# Patient Record
Sex: Male | Born: 1945
Health system: Southern US, Community
[De-identification: ages and names within clinical notes are randomized; demographics above are authoritative.]

## PROBLEM LIST (undated history)

## (undated) ENCOUNTER — Ambulatory Visit: Payer: Medicare PPO

## (undated) DIAGNOSIS — F4310 Post-traumatic stress disorder, unspecified: Secondary | ICD-10-CM

## (undated) DIAGNOSIS — E785 Hyperlipidemia, unspecified: Secondary | ICD-10-CM

## (undated) DIAGNOSIS — I10 Essential (primary) hypertension: Secondary | ICD-10-CM

## (undated) DIAGNOSIS — I219 Acute myocardial infarction, unspecified: Secondary | ICD-10-CM

## (undated) DIAGNOSIS — H409 Unspecified glaucoma: Secondary | ICD-10-CM

## (undated) DIAGNOSIS — I2510 Atherosclerotic heart disease of native coronary artery without angina pectoris: Secondary | ICD-10-CM

## (undated) HISTORY — PX: CERVICAL SPINE SURGERY: SHX589

## (undated) HISTORY — PX: NASAL SINUS SURGERY: SHX719

## (undated) HISTORY — PX: CORONARY ANGIOPLASTY WITH STENT PLACEMENT: SHX49

## (undated) HISTORY — PX: TONSILLECTOMY: SUR1361

---

## 2006-04-29 HISTORY — PX: COLONOSCOPY: SHX174

## 2009-04-29 HISTORY — PX: SALIVARY GLAND SURGERY: SHX768

## 2011-07-10 ENCOUNTER — Ambulatory Visit: Payer: Self-pay | Admitting: Cardiology

## 2013-06-07 ENCOUNTER — Ambulatory Visit: Payer: Self-pay | Admitting: Cardiology

## 2014-03-11 DIAGNOSIS — Z9889 Other specified postprocedural states: Secondary | ICD-10-CM | POA: Insufficient documentation

## 2014-03-14 DIAGNOSIS — K219 Gastro-esophageal reflux disease without esophagitis: Secondary | ICD-10-CM | POA: Insufficient documentation

## 2015-02-13 ENCOUNTER — Encounter: Payer: Self-pay | Admitting: Internal Medicine

## 2015-02-13 DIAGNOSIS — F4310 Post-traumatic stress disorder, unspecified: Secondary | ICD-10-CM | POA: Insufficient documentation

## 2015-02-13 DIAGNOSIS — M5136 Other intervertebral disc degeneration, lumbar region: Secondary | ICD-10-CM | POA: Insufficient documentation

## 2015-02-13 DIAGNOSIS — I10 Essential (primary) hypertension: Secondary | ICD-10-CM | POA: Insufficient documentation

## 2015-02-13 DIAGNOSIS — E7849 Other hyperlipidemia: Secondary | ICD-10-CM | POA: Insufficient documentation

## 2015-02-13 DIAGNOSIS — I2510 Atherosclerotic heart disease of native coronary artery without angina pectoris: Secondary | ICD-10-CM | POA: Insufficient documentation

## 2015-02-13 DIAGNOSIS — H9190 Unspecified hearing loss, unspecified ear: Secondary | ICD-10-CM | POA: Insufficient documentation

## 2015-02-13 DIAGNOSIS — L608 Other nail disorders: Secondary | ICD-10-CM | POA: Insufficient documentation

## 2015-02-14 ENCOUNTER — Other Ambulatory Visit: Payer: Self-pay | Admitting: Internal Medicine

## 2015-02-14 ENCOUNTER — Encounter: Payer: Self-pay | Admitting: Internal Medicine

## 2015-02-14 ENCOUNTER — Ambulatory Visit (INDEPENDENT_AMBULATORY_CARE_PROVIDER_SITE_OTHER): Payer: Medicare PPO | Admitting: Internal Medicine

## 2015-02-14 VITALS — BP 122/68 | HR 64 | Temp 98.4°F | Ht 70.0 in | Wt 169.4 lb

## 2015-02-14 DIAGNOSIS — J40 Bronchitis, not specified as acute or chronic: Secondary | ICD-10-CM

## 2015-02-14 MED ORDER — FLUTICASONE PROPIONATE 50 MCG/ACT NA SUSP: 2.0000 | Freq: Every day | NASAL | Status: DC

## 2015-02-14 MED ORDER — AMOXICILLIN-POT CLAVULANATE 875-125 MG PO TABS: 1.0000 | ORAL_TABLET | Freq: Two times a day (BID) | ORAL | Status: DC

## 2015-02-14 NOTE — Progress Notes (Signed)
Date:  02/14/2015   Name:  Dominic LorenzoKenneth E Mcluckie   DOB:  12/20/1945   MRN:  [id number]   Chief Complaint: Sinusitis Patient started with symptoms about 1 week ago. Initially a little chest congestion and cough and moved into sinus congestion. He is using neti pot and producing some thick yellow mucus.he denies fever or wheezing. Yesterday began to have pain in his right ear without drainage or bleeding. He's been unable to wear the hearing aid on that side.   Review of Systems  Constitutional: Negative for fever, chills and fatigue.  HENT: Positive for congestion, ear pain, postnasal drip, rhinorrhea, sinus pressure and sore throat.   Respiratory: Positive for cough. Negative for chest tightness, shortness of breath and wheezing.   Cardiovascular: Negative for chest pain, palpitations and leg swelling.  Gastrointestinal: Negative for vomiting and diarrhea.    Patient Active Problem List   Diagnosis Date Noted  . Familial multiple lipoprotein-type hyperlipidemia 02/13/2015  . Hearing loss 02/13/2015  . Neurosis, posttraumatic 02/13/2015  . Arteriosclerosis of coronary artery 02/13/2015  . Degeneration of intervertebral disc of lumbar region 02/13/2015  . Essential (primary) hypertension 02/13/2015  . Discoloration of nail 02/13/2015  . Acid reflux 03/14/2014  . History of cardiac catheterization 03/11/2014    Prior to Admission medications   Medication Sig Start Date End Date Taking? Authorizing Provider  aspirin (ECOTRIN LOW STRENGTH) 81 MG EC tablet Take 1 tablet by mouth daily.   Yes Historical Provider, MD  Cholecalciferol (HM VITAMIN D3) 4000 UNITS CAPS Take by mouth.   Yes Historical Provider, MD  clopidogrel (PLAVIX) 75 MG tablet Take 1 tablet by mouth daily.   Yes Historical Provider, MD  co-enzyme Q-10 30 MG capsule Take 30 mg by mouth 3 (three) times daily.   Yes Historical Provider, MD  Hydrocodone-Acetaminophen (VICODIN) 5-300 MG TABS Take by mouth.   Yes Historical  Provider, MD  ketoconazole (NIZORAL) 2 % shampoo KETOCONAZOLE, 2% (External Shampoo) - Historical Medication  (2 %) Active   Yes Historical Provider, MD  LORazepam (ATIVAN) 1 MG tablet Take by mouth.   Yes Historical Provider, MD  Multiple Vitamins-Minerals (CENTRUM SILVER) tablet Take by mouth.   Yes Historical Provider, MD  nitroGLYCERIN (NITROSTAT) 0.4 MG SL tablet Place under the tongue.   Yes Historical Provider, MD  olmesartan (BENICAR) 20 MG tablet Take 1 tablet by mouth daily.   Yes Historical Provider, MD  rosuvastatin (CRESTOR) 40 MG tablet Take 1 tablet by mouth daily.   Yes Historical Provider, MD  vitamin C (ASCORBIC ACID) 500 MG tablet Take by mouth.   Yes Historical Provider, MD    Allergies  Allergen Reactions  . Nortriptyline Hcl   . Prazosin Hcl     joint pain  . Quetiapine Fumarate   . Tizanidine     Past Surgical History  Procedure Laterality Date  . Tonsillectomy    . Nasal sinus surgery    . Cervical spine surgery    . Salivary gland surgery Right 2011    oncocytoma  . Coronary angioplasty with stent placement    . Colonoscopy  2008    Social History  Substance Use Topics  . Smoking status: Never Smoker   . Smokeless tobacco: None  . Alcohol Use: No    Medication list has been reviewed and updated.  Physical Exam  Constitutional: He is oriented to person, place, and time. He appears well-developed and well-nourished.  HENT:  Right Ear: External ear and ear canal normal. Tympanic  membrane is not erythematous and not retracted. A middle ear effusion is present.  Left Ear: External ear and ear canal normal. Tympanic membrane is not erythematous and not retracted.  No middle ear effusion.  Nose: Right sinus exhibits no maxillary sinus tenderness and no frontal sinus tenderness. Left sinus exhibits no maxillary sinus tenderness and no frontal sinus tenderness.  Mouth/Throat: Uvula is midline and mucous membranes are normal. No oral lesions. Posterior  oropharyngeal erythema present. No oropharyngeal exudate.  Cardiovascular: Normal rate, regular rhythm, S1 normal and normal heart sounds.   Pulmonary/Chest: Effort normal and breath sounds normal. He has no wheezes. He has no rales.  Lymphadenopathy:    He has no cervical adenopathy.  Neurological: He is alert and oriented to person, place, and time.  Psychiatric: He has a normal mood and affect.  Nursing note and vitals reviewed.   BP 122/68 mmHg  Pulse 64  Temp(Src) 98.4 F (36.9 C)  Ht  (1.778 m)  Wt 169 lb 6.4 oz (76.839 kg)  BMI 24.31 kg/m2  SpO2 96%  Assessment and Plan: 1. Bronchitis with sinusitis and OM Continue nasal rinses and over-the-counter cough suppressant - amoxicillin-clavulanate (AUGMENTIN) 875-125 MG tablet; Take 1 tablet by mouth 2 (two) times daily.  Dispense: 20 tablet; Refill: 0 - fluticasone (FLONASE) 50 MCG/ACT nasal spray; Place 2 sprays into both nostrils daily.  Dispense: 16 g; Refill: 6   Bari Edward, MD [facility]  02/14/2015

## 2015-03-08 DIAGNOSIS — E7800 Pure hypercholesterolemia, unspecified: Secondary | ICD-10-CM | POA: Diagnosis not present

## 2015-03-08 DIAGNOSIS — Z9889 Other specified postprocedural states: Secondary | ICD-10-CM | POA: Diagnosis not present

## 2015-03-08 DIAGNOSIS — I10 Essential (primary) hypertension: Secondary | ICD-10-CM | POA: Diagnosis not present

## 2015-03-15 ENCOUNTER — Ambulatory Visit: Payer: Self-pay | Admitting: Internal Medicine

## 2015-03-15 ENCOUNTER — Ambulatory Visit (INDEPENDENT_AMBULATORY_CARE_PROVIDER_SITE_OTHER): Payer: Medicare PPO | Admitting: Internal Medicine

## 2015-03-15 ENCOUNTER — Encounter: Payer: Self-pay | Admitting: Internal Medicine

## 2015-03-15 VITALS — BP 122/68 | HR 72 | Temp 98.3°F | Ht 70.0 in | Wt 168.8 lb

## 2015-03-15 DIAGNOSIS — H6691 Otitis media, unspecified, right ear: Secondary | ICD-10-CM

## 2015-03-15 MED ORDER — AMOXICILLIN-POT CLAVULANATE 875-125 MG PO TABS: 1.0000 | ORAL_TABLET | Freq: Two times a day (BID) | ORAL | Status: DC

## 2015-03-15 NOTE — Progress Notes (Signed)
Date:  03/15/2015   Name:  Dominic Mcmillan   DOB:  10-03-1945   MRN:  [id number]   Chief Complaint: Otalgia  Patient was seen about a month ago with sinus and otitis. He had a right middle ear effusion. He was prescribed 10 days of Augmentin and Flonase nasal spray which he has completed. He felt a bit better but now his right ear symptoms have recurred. In general he thinks his hearing is slightly decreased and he does have hearing aids for both ears. He denies fever, chills, dizziness, or ear drainage.   Review of Systems  Constitutional: Negative for fever, chills and fatigue.  HENT: Positive for ear pain and hearing loss. Negative for ear discharge, postnasal drip, sore throat and tinnitus.   Respiratory: Negative for cough and shortness of breath.   Cardiovascular: Negative for chest pain.  Neurological: Negative for dizziness.    Patient Active Problem List   Diagnosis Date Noted  . Familial multiple lipoprotein-type hyperlipidemia 02/13/2015  . Hearing loss 02/13/2015  . Neurosis, posttraumatic 02/13/2015  . Arteriosclerosis of coronary artery 02/13/2015  . Degeneration of intervertebral disc of lumbar region 02/13/2015  . Essential (primary) hypertension 02/13/2015  . Discoloration of nail 02/13/2015  . Acid reflux 03/14/2014  . History of cardiac catheterization 03/11/2014    Prior to Admission medications   Medication Sig Start Date End Date Taking? Authorizing Provider  aspirin (ECOTRIN LOW STRENGTH) 81 MG EC tablet Take 1 tablet by mouth daily.   Yes Historical Provider, MD  Cholecalciferol (HM VITAMIN D3) 4000 UNITS CAPS Take by mouth.   Yes Historical Provider, MD  clopidogrel (PLAVIX) 75 MG tablet Take 1 tablet by mouth daily.   Yes Historical Provider, MD  co-enzyme Q-10 30 MG capsule Take 30 mg by mouth 3 (three) times daily.   Yes Historical Provider, MD  fluticasone (FLONASE) 50 MCG/ACT nasal spray Place 2 sprays into both nostrils daily. 02/14/15  Yes Reubin Milan, MD  Hydrocodone-Acetaminophen (VICODIN) 5-300 MG TABS Take by mouth.   Yes Historical Provider, MD  ketoconazole (NIZORAL) 2 % shampoo KETOCONAZOLE, 2% (External Shampoo) - Historical Medication  (2 %) Active   Yes Historical Provider, MD  LORazepam (ATIVAN) 1 MG tablet Take by mouth.   Yes Historical Provider, MD  Multiple Vitamins-Minerals (CENTRUM SILVER) tablet Take by mouth.   Yes Historical Provider, MD  nitroGLYCERIN (NITROSTAT) 0.4 MG SL tablet Place under the tongue.   Yes Historical Provider, MD  olmesartan (BENICAR) 20 MG tablet Take 1 tablet by mouth daily.   Yes Historical Provider, MD  rosuvastatin (CRESTOR) 40 MG tablet Take 1 tablet by mouth daily.   Yes Historical Provider, MD  vitamin C (ASCORBIC ACID) 500 MG tablet Take by mouth.   Yes Historical Provider, MD    Allergies  Allergen Reactions  . Nortriptyline Hcl   . Prazosin Hcl     joint pain  . Quetiapine Fumarate   . Tizanidine     Past Surgical History  Procedure Laterality Date  . Tonsillectomy    . Nasal sinus surgery    . Cervical spine surgery    . Salivary gland surgery Right 2011    oncocytoma  . Coronary angioplasty with stent placement    . Colonoscopy  2008    Social History  Substance Use Topics  . Smoking status: Never Smoker   . Smokeless tobacco: None  . Alcohol Use: No    Medication list has been reviewed and updated.  Physical Exam  HENT:  Right Ear: Tympanic membrane is retracted. Tympanic membrane is not scarred and not erythematous. A middle ear effusion is present.  Nose: Right sinus exhibits no maxillary sinus tenderness and no frontal sinus tenderness. Left sinus exhibits no maxillary sinus tenderness and no frontal sinus tenderness.  Mouth/Throat: Oropharynx is clear and moist.  Cardiovascular: Normal rate, regular rhythm and normal heart sounds.   Pulmonary/Chest: Effort normal and breath sounds normal.  Lymphadenopathy:       Head (right side): Posterior  auricular adenopathy present.  Nursing note and vitals reviewed.   BP 122/68 mmHg  Pulse 72  Temp(Src) 98.3 F (36.8 C)  Ht 5\' 10"  (1.778 m)  Wt 168 lb 12.8 oz (76.567 kg)  BMI 24.22 kg/m2  SpO2 95%  Assessment and Plan: 1. Otitis media follow-up, not resolved, right Another course of antibiotics if no resolution will refer to ENT - amoxicillin-clavulanate (AUGMENTIN) 875-125 MG tablet; Take 1 tablet by mouth 2 (two) times daily.  Dispense: 20 tablet; Refill: 0   Bari EdwardLaura Berglund, MD [facility]  03/15/2015

## 2015-03-28 ENCOUNTER — Other Ambulatory Visit: Payer: Self-pay | Admitting: Internal Medicine

## 2015-03-28 DIAGNOSIS — H6690 Otitis media, unspecified, unspecified ear: Secondary | ICD-10-CM | POA: Insufficient documentation

## 2015-03-28 DIAGNOSIS — H6691 Otitis media, unspecified, right ear: Secondary | ICD-10-CM

## 2015-03-29 ENCOUNTER — Ambulatory Visit (INDEPENDENT_AMBULATORY_CARE_PROVIDER_SITE_OTHER): Payer: Medicare PPO | Admitting: Internal Medicine

## 2015-03-29 ENCOUNTER — Encounter: Payer: Self-pay | Admitting: Internal Medicine

## 2015-03-29 VITALS — BP 100/60 | HR 68 | Temp 98.2°F | Ht 70.0 in | Wt 167.9 lb

## 2015-03-29 DIAGNOSIS — H6691 Otitis media, unspecified, right ear: Secondary | ICD-10-CM

## 2015-03-29 MED ORDER — METHYLPREDNISOLONE 4 MG PO TBPK: ORAL_TABLET | ORAL | Status: DC

## 2015-03-29 NOTE — Progress Notes (Signed)
Date:  03/29/2015   Name:  Dominic LorenzoKenneth E Mcmillan   DOB:  02/11/1946   MRN:  [id number]   Chief Complaint: Otalgia  patient returns for ongoing right ear pain. He's been seen twice in the past 6 weeks with right ear pain otitis media and middle ear effusion. He was initially treated with Flonase and Augmentin and seemed to improve. He returned with persistent problem so then was given an additional course of Augmentin. He is here today because he still has intermittent discomfort in his right ear sometimes relatively severe. He uses heat which does help. At this point he would like to have an ENT evaluation.  Review of Systems  Constitutional: Negative for chills and fatigue.  HENT: Positive for ear pain and hearing loss. Negative for ear discharge, sinus pressure and sore throat.   Eyes: Negative for visual disturbance.  Respiratory: Negative for cough and shortness of breath.   Cardiovascular: Negative for chest pain.  Neurological: Negative for dizziness, syncope, light-headedness and headaches.    Patient Active Problem List   Diagnosis Date Noted  . Otitis media 03/28/2015  . Familial multiple lipoprotein-type hyperlipidemia 02/13/2015  . Hearing loss 02/13/2015  . Neurosis, posttraumatic 02/13/2015  . Arteriosclerosis of coronary artery 02/13/2015  . Degeneration of intervertebral disc of lumbar region 02/13/2015  . Essential (primary) hypertension 02/13/2015  . Discoloration of nail 02/13/2015  . Acid reflux 03/14/2014  . History of cardiac catheterization 03/11/2014    Prior to Admission medications   Medication Sig Start Date End Date Taking? Authorizing Provider  aspirin (ECOTRIN LOW STRENGTH) 81 MG EC tablet Take 1 tablet by mouth daily.   Yes Historical Provider, MD  Cholecalciferol (HM VITAMIN D3) 4000 UNITS CAPS Take by mouth.   Yes Historical Provider, MD  clopidogrel (PLAVIX) 75 MG tablet Take 1 tablet by mouth daily.   Yes Historical Provider, MD  co-enzyme Q-10 30  MG capsule Take 30 mg by mouth 3 (three) times daily.   Yes Historical Provider, MD  fluticasone (FLONASE) 50 MCG/ACT nasal spray Place 2 sprays into both nostrils daily. 02/14/15  Yes Reubin MilanLaura H Jun Rightmyer, MD  Hydrocodone-Acetaminophen (VICODIN) 5-300 MG TABS Take by mouth.   Yes Historical Provider, MD  ketoconazole (NIZORAL) 2 % shampoo KETOCONAZOLE, 2% (External Shampoo) - Historical Medication  (2 %) Active   Yes Historical Provider, MD  LORazepam (ATIVAN) 1 MG tablet Take by mouth.   Yes Historical Provider, MD  Multiple Vitamins-Minerals (CENTRUM SILVER) tablet Take by mouth.   Yes Historical Provider, MD  nitroGLYCERIN (NITROSTAT) 0.4 MG SL tablet Place under the tongue.   Yes Historical Provider, MD  olmesartan (BENICAR) 20 MG tablet Take 1 tablet by mouth daily.   Yes Historical Provider, MD  rosuvastatin (CRESTOR) 40 MG tablet Take 1 tablet by mouth daily.   Yes Historical Provider, MD  vitamin C (ASCORBIC ACID) 500 MG tablet Take by mouth.   Yes Historical Provider, MD    Allergies  Allergen Reactions  . Nortriptyline Hcl   . Prazosin Hcl     joint pain  . Quetiapine Fumarate   . Tizanidine     Past Surgical History  Procedure Laterality Date  . Tonsillectomy    . Nasal sinus surgery    . Cervical spine surgery    . Salivary gland surgery Right 2011    oncocytoma  . Coronary angioplasty with stent placement    . Colonoscopy  2008    Social History  Substance Use Topics  . Smoking  status: Never Smoker   . Smokeless tobacco: None  . Alcohol Use: No    Medication list has been reviewed and updated.   Physical Exam  Constitutional: He appears well-developed and well-nourished. No distress.  HENT:  Right Ear: Tympanic membrane and ear canal normal.  Left Ear: Ear canal normal. A middle ear effusion (smaller than last visit) is present.  Mouth/Throat: Oropharynx is clear and moist.  Neck: Normal range of motion. Neck supple. No thyromegaly present.   Cardiovascular: Normal rate, regular rhythm and normal heart sounds.   Pulmonary/Chest: Effort normal and breath sounds normal.  Lymphadenopathy:    He has no cervical adenopathy.    BP 100/60 mmHg  Pulse 68  Temp(Src) 98.2 F (36.8 C)  Ht  (1.778 m)  Wt 167 lb 14.4 oz (76.159 kg)  BMI 24.09 kg/m2  SpO2 96%  Assessment and Plan: 1. Recurrent otitis media of right ear, unspecified chronicity, unspecified otitis media type Resume Flonase NS - methylPREDNISolone (MEDROL DOSEPAK) 4 MG TBPK tablet; Take 6 pills on day 1 the 5 pills day 2 then 4 pills day 3 then 3 pills day 4 then 2 pills day 5 then one pills day 6 then stop  Dispense: 21 tablet; Refill: 0 See ENT as planned next week.  Bari Edward, MD [facility]  03/29/2015

## 2015-04-05 DIAGNOSIS — H6981 Other specified disorders of Eustachian tube, right ear: Secondary | ICD-10-CM | POA: Diagnosis not present

## 2015-04-05 DIAGNOSIS — H65117 Acute and subacute allergic otitis media (mucoid) (sanguinous) (serous), recurrent, unspecified ear: Secondary | ICD-10-CM | POA: Diagnosis not present

## 2015-04-05 DIAGNOSIS — M950 Acquired deformity of nose: Secondary | ICD-10-CM | POA: Diagnosis not present

## 2015-04-05 DIAGNOSIS — R0982 Postnasal drip: Secondary | ICD-10-CM | POA: Diagnosis not present

## 2015-04-05 DIAGNOSIS — H903 Sensorineural hearing loss, bilateral: Secondary | ICD-10-CM | POA: Diagnosis not present

## 2015-08-02 ENCOUNTER — Ambulatory Visit (INDEPENDENT_AMBULATORY_CARE_PROVIDER_SITE_OTHER): Payer: Medicare PPO | Admitting: Internal Medicine

## 2015-08-02 ENCOUNTER — Encounter: Payer: Self-pay | Admitting: Internal Medicine

## 2015-08-02 VITALS — BP 116/64 | HR 62 | Temp 98.0°F | Ht 70.0 in | Wt 164.0 lb

## 2015-08-02 DIAGNOSIS — J40 Bronchitis, not specified as acute or chronic: Secondary | ICD-10-CM

## 2015-08-02 MED ORDER — HYDROCODONE-HOMATROPINE 5-1.5 MG/5ML PO SYRP: 5.0000 mL | ORAL_SOLUTION | Freq: Four times a day (QID) | ORAL | Status: DC | PRN

## 2015-08-02 NOTE — Progress Notes (Signed)
Date:  08/02/2015   Name:  Dominic LorenzoKenneth E Rauth   DOB:  09/03/1945   MRN:  [id number]   Chief Complaint: Sinusitis Sinusitis This is a new problem. The current episode started 1 to 4 weeks ago. The problem is unchanged. There has been no fever. Associated symptoms include congestion and coughing. Pertinent negatives include no chills, headaches, shortness of breath, sinus pressure, sore throat or swollen glands.  He was treated with zithromax 500 mg for 5 days.  He finished the course one week ago.  His cough is still present but much improved.  He is mostly coughing at night.   Review of Systems  Constitutional: Positive for appetite change. Negative for fever, chills and fatigue.  HENT: Positive for congestion. Negative for sinus pressure and sore throat.   Respiratory: Positive for cough. Negative for choking, chest tightness, shortness of breath and wheezing.   Cardiovascular: Negative for chest pain.  Gastrointestinal: Negative for abdominal pain and diarrhea.  Neurological: Negative for dizziness, light-headedness and headaches.  Hematological: Negative for adenopathy.    Patient Active Problem List   Diagnosis Date Noted  . Otitis media 03/28/2015  . Familial multiple lipoprotein-type hyperlipidemia 02/13/2015  . Hearing loss 02/13/2015  . Neurosis, posttraumatic 02/13/2015  . Arteriosclerosis of coronary artery 02/13/2015  . Degeneration of intervertebral disc of lumbar region 02/13/2015  . Essential (primary) hypertension 02/13/2015  . Discoloration of nail 02/13/2015  . Acid reflux 03/14/2014  . History of cardiac catheterization 03/11/2014    Prior to Admission medications   Medication Sig Start Date End Date Taking? Authorizing Provider  aspirin (ECOTRIN LOW STRENGTH) 81 MG EC tablet Take 1 tablet by mouth daily.   Yes Historical Provider, MD  Cholecalciferol (HM VITAMIN D3) 4000 UNITS CAPS Take by mouth.   Yes Historical Provider, MD  clopidogrel (PLAVIX) 75 MG  tablet Take 1 tablet by mouth daily.   Yes Historical Provider, MD  co-enzyme Q-10 30 MG capsule Take 30 mg by mouth 3 (three) times daily.   Yes Historical Provider, MD  fluticasone (FLONASE) 50 MCG/ACT nasal spray Place 2 sprays into both nostrils daily. 02/14/15  Yes Reubin MilanLaura H Kelbi Renstrom, MD  Hydrocodone-Acetaminophen (VICODIN) 5-300 MG TABS Take by mouth.   Yes Historical Provider, MD  ketoconazole (NIZORAL) 2 % shampoo KETOCONAZOLE, 2% (External Shampoo) - Historical Medication  (2 %) Active   Yes Historical Provider, MD  LORazepam (ATIVAN) 1 MG tablet Take by mouth.   Yes Historical Provider, MD  methylPREDNISolone (MEDROL DOSEPAK) 4 MG TBPK tablet Take 6 pills on day 1 the 5 pills day 2 then 4 pills day 3 then 3 pills day 4 then 2 pills day 5 then one pills day 6 then stop 03/29/15  Yes Reubin MilanLaura H Shaheen Mende, MD  Multiple Vitamins-Minerals (CENTRUM SILVER) tablet Take by mouth.   Yes Historical Provider, MD  nitroGLYCERIN (NITROSTAT) 0.4 MG SL tablet Place under the tongue.   Yes Historical Provider, MD  olmesartan (BENICAR) 20 MG tablet Take 1 tablet by mouth daily.   Yes Historical Provider, MD  rosuvastatin (CRESTOR) 40 MG tablet Take 1 tablet by mouth daily.   Yes Historical Provider, MD  vitamin C (ASCORBIC ACID) 500 MG tablet Take by mouth.   Yes Historical Provider, MD    Allergies  Allergen Reactions  . Nortriptyline Hcl   . Prazosin Hcl     joint pain  . Quetiapine Fumarate   . Tizanidine     Past Surgical History  Procedure Laterality Date  .  Tonsillectomy    . Nasal sinus surgery    . Cervical spine surgery    . Salivary gland surgery Right 2011    oncocytoma  . Coronary angioplasty with stent placement    . Colonoscopy  2008    Social History  Substance Use Topics  . Smoking status: Never Smoker   . Smokeless tobacco: None  . Alcohol Use: No    Medication list has been reviewed and updated.   Physical Exam  Constitutional: He is oriented to person, place, and  time. He appears well-developed and well-nourished.  Neck: Normal range of motion. Neck supple.  Cardiovascular: Normal rate, regular rhythm and normal heart sounds.   Pulmonary/Chest: Effort normal and breath sounds normal. He has no wheezes. He has no rales.  Lymphadenopathy:    He has no cervical adenopathy.  Neurological: He is alert and oriented to person, place, and time.  Psychiatric: He has a normal mood and affect. His behavior is normal. Thought content normal.  Nursing note and vitals reviewed.   BP 116/64 mmHg  Pulse 62  Temp(Src) 98 F (36.7 C) (Oral)  Ht  (1.778 m)  Wt 164 lb (74.39 kg)  BMI 23.53 kg/m2  SpO2 96%  Assessment and Plan: 1. Bronchitis No further antibiotics needed Begin Mucinex bid - HYDROcodone-homatropine (HYCODAN) 5-1.5 MG/5ML syrup; Take 5 mLs by mouth every 6 (six) hours as needed for cough.  Dispense: 120 mL; Refill: 0   Bari Edward, MD [facility]  08/02/2015

## 2015-08-02 NOTE — Patient Instructions (Signed)

## 2015-08-31 DIAGNOSIS — R079 Chest pain, unspecified: Secondary | ICD-10-CM | POA: Diagnosis not present

## 2015-08-31 DIAGNOSIS — I10 Essential (primary) hypertension: Secondary | ICD-10-CM | POA: Diagnosis not present

## 2015-08-31 DIAGNOSIS — E7800 Pure hypercholesterolemia, unspecified: Secondary | ICD-10-CM | POA: Diagnosis not present

## 2015-08-31 DIAGNOSIS — Z9889 Other specified postprocedural states: Secondary | ICD-10-CM | POA: Diagnosis not present

## 2015-09-08 DIAGNOSIS — R079 Chest pain, unspecified: Secondary | ICD-10-CM | POA: Diagnosis not present

## 2015-09-08 DIAGNOSIS — R0600 Dyspnea, unspecified: Secondary | ICD-10-CM | POA: Diagnosis not present

## 2015-09-11 ENCOUNTER — Observation Stay
Admission: EM | Admit: 2015-09-11 | Discharge: 2015-09-12 | Disposition: A | Discharge status: Home / Self Care | Payer: Medicare PPO | Attending: Internal Medicine | Admitting: Internal Medicine

## 2015-09-11 ENCOUNTER — Other Ambulatory Visit: Payer: Self-pay

## 2015-09-11 ENCOUNTER — Emergency Department: Payer: Medicare PPO

## 2015-09-11 ENCOUNTER — Encounter: Payer: Self-pay | Admitting: Emergency Medicine

## 2015-09-11 DIAGNOSIS — Z8249 Family history of ischemic heart disease and other diseases of the circulatory system: Secondary | ICD-10-CM | POA: Insufficient documentation

## 2015-09-11 DIAGNOSIS — I10 Essential (primary) hypertension: Secondary | ICD-10-CM | POA: Diagnosis present

## 2015-09-11 DIAGNOSIS — R739 Hyperglycemia, unspecified: Secondary | ICD-10-CM | POA: Diagnosis not present

## 2015-09-11 DIAGNOSIS — I25110 Atherosclerotic heart disease of native coronary artery with unstable angina pectoris: Secondary | ICD-10-CM | POA: Diagnosis not present

## 2015-09-11 DIAGNOSIS — Z7902 Long term (current) use of antithrombotics/antiplatelets: Secondary | ICD-10-CM | POA: Insufficient documentation

## 2015-09-11 DIAGNOSIS — R079 Chest pain, unspecified: Secondary | ICD-10-CM

## 2015-09-11 DIAGNOSIS — Z888 Allergy status to other drugs, medicaments and biological substances status: Secondary | ICD-10-CM | POA: Insufficient documentation

## 2015-09-11 DIAGNOSIS — Z79899 Other long term (current) drug therapy: Secondary | ICD-10-CM | POA: Diagnosis not present

## 2015-09-11 DIAGNOSIS — I2510 Atherosclerotic heart disease of native coronary artery without angina pectoris: Secondary | ICD-10-CM | POA: Diagnosis present

## 2015-09-11 DIAGNOSIS — R0789 Other chest pain: Secondary | ICD-10-CM | POA: Insufficient documentation

## 2015-09-11 DIAGNOSIS — E7800 Pure hypercholesterolemia, unspecified: Secondary | ICD-10-CM | POA: Diagnosis not present

## 2015-09-11 DIAGNOSIS — Z9889 Other specified postprocedural states: Secondary | ICD-10-CM | POA: Diagnosis not present

## 2015-09-11 DIAGNOSIS — E782 Mixed hyperlipidemia: Secondary | ICD-10-CM | POA: Insufficient documentation

## 2015-09-11 DIAGNOSIS — D696 Thrombocytopenia, unspecified: Secondary | ICD-10-CM

## 2015-09-11 DIAGNOSIS — Z7982 Long term (current) use of aspirin: Secondary | ICD-10-CM | POA: Diagnosis not present

## 2015-09-11 DIAGNOSIS — I252 Old myocardial infarction: Secondary | ICD-10-CM | POA: Insufficient documentation

## 2015-09-11 DIAGNOSIS — K219 Gastro-esophageal reflux disease without esophagitis: Secondary | ICD-10-CM | POA: Diagnosis present

## 2015-09-11 DIAGNOSIS — Z955 Presence of coronary angioplasty implant and graft: Secondary | ICD-10-CM | POA: Diagnosis not present

## 2015-09-11 HISTORY — DX: Atherosclerotic heart disease of native coronary artery without angina pectoris: I25.10

## 2015-09-11 HISTORY — DX: Acute myocardial infarction, unspecified: I21.9

## 2015-09-11 HISTORY — DX: Essential (primary) hypertension: I10

## 2015-09-11 HISTORY — DX: Hyperlipidemia, unspecified: E78.5

## 2015-09-11 LAB — CBC
HCT: 38.3 % — ABNORMAL LOW (ref 40.0–52.0)
Hemoglobin: 13.2 g/dL (ref 13.0–18.0)
MCH: 33.5 pg (ref 26.0–34.0)
MCHC: 34.5 g/dL (ref 32.0–36.0)
MCV: 97.4 fL (ref 80.0–100.0)
Platelets: 119 10*3/uL — ABNORMAL LOW (ref 150–440)
RBC: 3.93 MIL/uL — ABNORMAL LOW (ref 4.40–5.90)
RDW: 14.2 % (ref 11.5–14.5)
WBC: 4.8 10*3/uL (ref 3.8–10.6)

## 2015-09-11 LAB — BASIC METABOLIC PANEL
Anion gap: 5 (ref 5–15)
BUN: 16 mg/dL (ref 6–20)
CALCIUM: 9.2 mg/dL (ref 8.9–10.3)
CO2: 27 mmol/L (ref 22–32)
Chloride: 107 mmol/L (ref 101–111)
Creatinine, Ser: 0.61 mg/dL (ref 0.61–1.24)
GFR calc Af Amer: >60 mL/min (ref >60)
GLUCOSE: 104 mg/dL — AB (ref 65–99)
Potassium: 3.7 mmol/L (ref 3.5–5.1)
Sodium: 139 mmol/L (ref 135–145)

## 2015-09-11 LAB — TROPONIN I

## 2015-09-11 NOTE — ED Provider Notes (Signed)
[facility] Emergency Department Provider Note   ____________________________________________  Time seen: ~2210  I have reviewed the triage vital signs and the nursing notes.   HISTORY  Chief Complaint Chest Pain   History limited by: Not Limited   HPI Dominic Mcmillan is a 70 y.o. male with history of coronary artery disease presents to the emergency department today because of concerns for chest pressure. He describes this as starting 4 days ago. He states that it started shortly after he underwent a treadmill test at his cardiologist's office. He states he has had some shortness of breath with this. The patient states that initially he thought he was given be able to wait until he discussed the results of the treadmill test with his cardiologist however the discomfort got worse today. He states he did take a nitroglycerin way to the emergency department which did help with the pain.    Past Medical History  Diagnosis Date  . Acute MI [facility])     Patient Active Problem List   Diagnosis Date Noted  . Otitis media 03/28/2015  . Familial multiple lipoprotein-type hyperlipidemia 02/13/2015  . Hearing loss 02/13/2015  . Neurosis, posttraumatic 02/13/2015  . Arteriosclerosis of coronary artery 02/13/2015  . Degeneration of intervertebral disc of lumbar region 02/13/2015  . Essential (primary) hypertension 02/13/2015  . Discoloration of nail 02/13/2015  . Acid reflux 03/14/2014  . History of cardiac catheterization 03/11/2014    Past Surgical History  Procedure Laterality Date  . Tonsillectomy    . Nasal sinus surgery    . Cervical spine surgery    . Salivary gland surgery Right 2011    oncocytoma  . Coronary angioplasty with stent placement    . Colonoscopy  2008  . Coronary angioplasty with stent placement      Current Outpatient Rx  Name  Route  Sig  Dispense  Refill  . aspirin (ECOTRIN LOW STRENGTH) 81 MG EC tablet   Oral   Take 1  tablet by mouth daily.         . Cholecalciferol (HM VITAMIN D3) 4000 UNITS CAPS   Oral   Take by mouth.         . clopidogrel (PLAVIX) 75 MG tablet   Oral   Take 1 tablet by mouth daily.         Marland Kitchen co-enzyme Q-10 30 MG capsule   Oral   Take 30 mg by mouth 3 (three) times daily.         . fluticasone (FLONASE) 50 MCG/ACT nasal spray   Each Nare   Place 2 sprays into both nostrils daily.   16 g   6   . Hydrocodone-Acetaminophen (VICODIN) 5-300 MG TABS   Oral   Take by mouth.         Marland Kitchen HYDROcodone-homatropine (HYCODAN) 5-1.5 MG/5ML syrup   Oral   Take 5 mLs by mouth every 6 (six) hours as needed for cough.   120 mL   0   . ketoconazole (NIZORAL) 2 % shampoo      KETOCONAZOLE, 2% (External Shampoo) - Historical Medication  (2 %) Active         . LORazepam (ATIVAN) 1 MG tablet   Oral   Take by mouth.         . methylPREDNISolone (MEDROL DOSEPAK) 4 MG TBPK tablet      Take 6 pills on day 1 the 5 pills day 2 then 4 pills day 3 then 3 pills  day 4 then 2 pills day 5 then one pills day 6 then stop   21 tablet   0   . Multiple Vitamins-Minerals (CENTRUM SILVER) tablet   Oral   Take by mouth.         . nitroGLYCERIN (NITROSTAT) 0.4 MG SL tablet   Sublingual   Place under the tongue.         Marland Kitchen olmesartan (BENICAR) 20 MG tablet   Oral   Take 1 tablet by mouth daily.         . rosuvastatin (CRESTOR) 40 MG tablet   Oral   Take 1 tablet by mouth daily.         . vitamin C (ASCORBIC ACID) 500 MG tablet   Oral   Take by mouth.           Allergies Nortriptyline hcl; Prazosin hcl; Quetiapine fumarate; and Tizanidine  Family History  Problem Relation Age of Onset  . Hypertension Mother     Social History Social History  Substance Use Topics  . Smoking status: Never Smoker   . Smokeless tobacco: Not on file  . Alcohol Use: No    Review of Systems  Constitutional: Negative for fever. Cardiovascular: Positive chest  pressure Respiratory: Positive for shortness of breath. Gastrointestinal: Negative for abdominal pain, vomiting and diarrhea. Neurological: Negative for headaches, focal weakness or numbness.   10-point ROS otherwise negative.  ____________________________________________   PHYSICAL EXAM:  VITAL SIGNS: ED Triage Vitals  Enc Vitals Group     BP 09/11/15 1920 140/73 mmHg     Pulse Rate 09/11/15 1920 62     Resp 09/11/15 1920 20     Temp 09/11/15 1920 97.9 F (36.6 C)     Temp Source 09/11/15 1920 Oral     SpO2 09/11/15 1920 97 %     Weight 09/11/15 1920 170 lb (77.111 kg)     Height 09/11/15 1920 5\' 11"  (1.803 m)     Head Cir --      Peak Flow --      Pain Score 09/11/15 1918 3   Constitutional: Alert and oriented. Well appearing and in no distress. Eyes: Conjunctivae are normal. PERRL. Normal extraocular movements. ENT   Head: Normocephalic and atraumatic.   Nose: No congestion/rhinnorhea.   Mouth/Throat: Mucous membranes are moist.   Neck: No stridor. Hematological/Lymphatic/Immunilogical: No cervical lymphadenopathy. Cardiovascular: Normal rate, regular rhythm.  No murmurs, rubs, or gallops. Respiratory: Normal respiratory effort without tachypnea nor retractions. Breath sounds are clear and equal bilaterally. No wheezes/rales/rhonchi. Gastrointestinal: Soft and nontender. No distention.  Genitourinary: Deferred Musculoskeletal: Normal range of motion in all extremities. No joint effusions.  No lower extremity tenderness nor edema. Neurologic:  Normal speech and language. No gross focal neurologic deficits are appreciated.  Skin:  Skin is warm, dry and intact. No rash noted. Psychiatric: Mood and affect are normal. Speech and behavior are normal. Patient exhibits appropriate insight and judgment.  ____________________________________________    LABS (pertinent positives/negatives)  Labs Reviewed  BASIC METABOLIC PANEL - Abnormal; Notable for the  following:    Glucose, Bld 104 (*)    All other components within normal limits  CBC - Abnormal; Notable for the following:    RBC 3.93 (*)    HCT 38.3 (*)    Platelets 119 (*)    All other components within normal limits  TROPONIN I  TROPONIN I     ____________________________________________   EKG  I, Phineas Semen, attending physician, personally viewed  and interpreted this EKG  EKG Time: 1921 Rate: 56 Rhythm: sinus bradycardia with 1st degree av block Axis: normal Intervals: qtc 405 QRS: narrow, q waves V1, V2 ST changes: no st elevation Impression: abnormal ekg   ____________________________________________    RADIOLOGY  CXR  IMPRESSION: No active cardiopulmonary disease.  ____________________________________________   PROCEDURES  Procedure(s) performed: None  Critical Care performed: No  ____________________________________________   INITIAL IMPRESSION / ASSESSMENT AND PLAN / ED COURSE  Pertinent labs & imaging results that were available during my care of the patient were reviewed by me and considered in my medical decision making (see chart for details).  Patient presents to the emergency department today with concerns for chest pressure. Does have a history of coronary artery disease with stents. He did undergo a treadmill test last week. He is unclear of the results. Initial troponin and EKG without concerning findings. Will certainly send a second troponin. Additionally will try to contact cardiology to get results of recent tests.  ----------------------------------------- 11:54 PM on 09/11/2015 -----------------------------------------  Discuss with cardiology. Given the patient's symptoms today they recommended admission.  ____________________________________________   FINAL CLINICAL IMPRESSION(S) / ED DIAGNOSES  Final diagnoses:  Chest pain, unspecified chest pain type     Phineas SemenGraydon Viggo Perko, MD 09/11/15 2359

## 2015-09-11 NOTE — ED Notes (Addendum)
Pt to triage via w/c with no distress noted; pt reports left sided CP since Thursday after treadmill test, worse since this afternoon; st pain radiating into left arm accomp by nausea; took NTG and ASA enroute and pain to arm resolved; hx MI and card stent x 4

## 2015-09-12 ENCOUNTER — Encounter: Payer: Self-pay | Admitting: Internal Medicine

## 2015-09-12 ENCOUNTER — Encounter
Admission: EM | Disposition: A | Discharge status: Home / Self Care | Payer: Self-pay | Source: Home / Self Care | Attending: Emergency Medicine

## 2015-09-12 DIAGNOSIS — I25110 Atherosclerotic heart disease of native coronary artery with unstable angina pectoris: Secondary | ICD-10-CM | POA: Diagnosis not present

## 2015-09-12 DIAGNOSIS — R739 Hyperglycemia, unspecified: Secondary | ICD-10-CM

## 2015-09-12 DIAGNOSIS — D696 Thrombocytopenia, unspecified: Secondary | ICD-10-CM

## 2015-09-12 DIAGNOSIS — I200 Unstable angina: Secondary | ICD-10-CM | POA: Diagnosis not present

## 2015-09-12 DIAGNOSIS — I252 Old myocardial infarction: Secondary | ICD-10-CM | POA: Diagnosis not present

## 2015-09-12 DIAGNOSIS — I10 Essential (primary) hypertension: Secondary | ICD-10-CM | POA: Diagnosis not present

## 2015-09-12 DIAGNOSIS — R0602 Shortness of breath: Secondary | ICD-10-CM | POA: Diagnosis not present

## 2015-09-12 DIAGNOSIS — R079 Chest pain, unspecified: Secondary | ICD-10-CM | POA: Diagnosis not present

## 2015-09-12 DIAGNOSIS — I25719 Atherosclerosis of autologous vein coronary artery bypass graft(s) with unspecified angina pectoris: Secondary | ICD-10-CM | POA: Diagnosis not present

## 2015-09-12 HISTORY — PX: CARDIAC CATHETERIZATION: SHX172

## 2015-09-12 LAB — BASIC METABOLIC PANEL
ANION GAP: 3 — AB (ref 5–15)
BUN: 11 mg/dL (ref 6–20)
CALCIUM: 8.9 mg/dL (ref 8.9–10.3)
CO2: 30 mmol/L (ref 22–32)
CREATININE: 0.52 mg/dL — AB (ref 0.61–1.24)
Chloride: 108 mmol/L (ref 101–111)
GFR calc non Af Amer: >60 mL/min (ref >60)
Glucose, Bld: 88 mg/dL (ref 65–99)
Potassium: 4 mmol/L (ref 3.5–5.1)
SODIUM: 141 mmol/L (ref 135–145)

## 2015-09-12 LAB — CBC
HCT: 38.6 % — ABNORMAL LOW (ref 40.0–52.0)
HEMOGLOBIN: 13.1 g/dL (ref 13.0–18.0)
MCH: 33 pg (ref 26.0–34.0)
MCHC: 34 g/dL (ref 32.0–36.0)
MCV: 97.1 fL (ref 80.0–100.0)
PLATELETS: 112 10*3/uL — AB (ref 150–440)
RBC: 3.98 MIL/uL — AB (ref 4.40–5.90)
RDW: 14.1 % (ref 11.5–14.5)
WBC: 4.5 10*3/uL (ref 3.8–10.6)

## 2015-09-12 LAB — TROPONIN I: Troponin I: <0.03 ng/mL (ref <0.031)

## 2015-09-12 SURGERY — LEFT HEART CATH AND CORONARY ANGIOGRAPHY
Anesthesia: Moderate Sedation

## 2015-09-12 MED ORDER — ACETAMINOPHEN 650 MG RE SUPP: 650.0000 mg | Freq: Four times a day (QID) | RECTAL | Status: DC | PRN

## 2015-09-12 MED ORDER — ASPIRIN EC 81 MG PO TBEC
81.0000 mg | DELAYED_RELEASE_TABLET | Freq: Every day | ORAL | Status: DC
  Administered 2015-09-12: 81 mg via ORAL
  Filled 2015-09-12: qty 1

## 2015-09-12 MED ORDER — IRBESARTAN 150 MG PO TABS
150.0000 mg | ORAL_TABLET | Freq: Every day | ORAL | Status: DC
  Administered 2015-09-12: 150 mg via ORAL
  Filled 2015-09-12: qty 1

## 2015-09-12 MED ORDER — ACETAMINOPHEN 325 MG PO TABS: 650.0000 mg | ORAL_TABLET | ORAL | Status: DC | PRN

## 2015-09-12 MED ORDER — HEPARIN (PORCINE) IN NACL 2-0.9 UNIT/ML-% IJ SOLN
INTRAMUSCULAR | Status: AC
  Filled 2015-09-12: qty 1000

## 2015-09-12 MED ORDER — SODIUM CHLORIDE 0.9% FLUSH: 3.0000 mL | INTRAVENOUS | Status: DC | PRN

## 2015-09-12 MED ORDER — LORAZEPAM 1 MG PO TABS
2.0000 mg | ORAL_TABLET | Freq: Every day | ORAL | Status: DC
  Administered 2015-09-12: 2 mg via ORAL
  Filled 2015-09-12: qty 2

## 2015-09-12 MED ORDER — NITROGLYCERIN 0.4 MG SL SUBL: 0.4000 mg | SUBLINGUAL_TABLET | SUBLINGUAL | Status: DC | PRN

## 2015-09-12 MED ORDER — FENTANYL CITRATE (PF) 100 MCG/2ML IJ SOLN
INTRAMUSCULAR | Status: DC | PRN
  Administered 2015-09-12 (×2): 25 ug via INTRAVENOUS

## 2015-09-12 MED ORDER — ISOSORBIDE MONONITRATE ER 30 MG PO TB24: 30.0000 mg | ORAL_TABLET | Freq: Every day | ORAL | Status: DC

## 2015-09-12 MED ORDER — ISOSORBIDE MONONITRATE ER 30 MG PO TB24
30.0000 mg | ORAL_TABLET | Freq: Every day | ORAL | Status: DC
  Administered 2015-09-12: 30 mg via ORAL
  Filled 2015-09-12: qty 1

## 2015-09-12 MED ORDER — ACETAMINOPHEN 325 MG PO TABS: 650.0000 mg | ORAL_TABLET | Freq: Four times a day (QID) | ORAL | Status: DC | PRN

## 2015-09-12 MED ORDER — MIDAZOLAM HCL 2 MG/2ML IJ SOLN
INTRAMUSCULAR | Status: DC | PRN
  Administered 2015-09-12 (×2): 1 mg via INTRAVENOUS

## 2015-09-12 MED ORDER — ONDANSETRON HCL 4 MG/2ML IJ SOLN: 4.0000 mg | Freq: Four times a day (QID) | INTRAMUSCULAR | Status: DC | PRN

## 2015-09-12 MED ORDER — FENTANYL CITRATE (PF) 100 MCG/2ML IJ SOLN
INTRAMUSCULAR | Status: AC
  Filled 2015-09-12: qty 2

## 2015-09-12 MED ORDER — ROSUVASTATIN CALCIUM 20 MG PO TABS
40.0000 mg | ORAL_TABLET | Freq: Every day | ORAL | Status: DC
  Administered 2015-09-12: 40 mg via ORAL
  Filled 2015-09-12: qty 2
  Filled 2015-09-12: qty 1

## 2015-09-12 MED ORDER — ASPIRIN 81 MG PO CHEW: 81.0000 mg | CHEWABLE_TABLET | ORAL | Status: DC

## 2015-09-12 MED ORDER — SODIUM CHLORIDE 0.9 % IV SOLN: 250.0000 mL | INTRAVENOUS | Status: DC | PRN

## 2015-09-12 MED ORDER — IOPAMIDOL (ISOVUE-300) INJECTION 61%
INTRAVENOUS | Status: DC | PRN
  Administered 2015-09-12: 120 mL via INTRA_ARTERIAL

## 2015-09-12 MED ORDER — MORPHINE SULFATE (PF) 4 MG/ML IV SOLN: 4.0000 mg | INTRAVENOUS | Status: DC | PRN

## 2015-09-12 MED ORDER — SODIUM CHLORIDE 0.9 % WEIGHT BASED INFUSION
1.0000 mL/kg/h | INTRAVENOUS | Status: DC
Start: 2015-09-13 — End: 2015-09-12

## 2015-09-12 MED ORDER — ENOXAPARIN SODIUM 40 MG/0.4ML ~~LOC~~ SOLN: 40.0000 mg | SUBCUTANEOUS | Status: DC

## 2015-09-12 MED ORDER — SODIUM CHLORIDE 0.9% FLUSH
3.0000 mL | Freq: Two times a day (BID) | INTRAVENOUS | Status: DC
  Administered 2015-09-12: 3 mL via INTRAVENOUS

## 2015-09-12 MED ORDER — CLOPIDOGREL BISULFATE 75 MG PO TABS
75.0000 mg | ORAL_TABLET | Freq: Every day | ORAL | Status: DC
Start: 2015-09-12 — End: 2015-09-12
  Administered 2015-09-12: 75 mg via ORAL
  Filled 2015-09-12: qty 1

## 2015-09-12 MED ORDER — SODIUM CHLORIDE 0.9 % WEIGHT BASED INFUSION
3.0000 mL/kg/h | INTRAVENOUS | Status: DC
Start: 2015-09-13 — End: 2015-09-12

## 2015-09-12 MED ORDER — MIDAZOLAM HCL 2 MG/2ML IJ SOLN
INTRAMUSCULAR | Status: AC
  Filled 2015-09-12: qty 2

## 2015-09-12 MED ORDER — LORAZEPAM 1 MG PO TABS: 1.0000 mg | ORAL_TABLET | Freq: Three times a day (TID) | ORAL | Status: DC | PRN

## 2015-09-12 MED ORDER — SODIUM CHLORIDE 0.9 % IV SOLN
INTRAVENOUS | Status: AC
  Administered 2015-09-12: 02:00:00 via INTRAVENOUS

## 2015-09-12 MED ORDER — SODIUM CHLORIDE 0.9 % IV SOLN
INTRAVENOUS | Status: AC
  Administered 2015-09-12: 14:00:00 via INTRAVENOUS

## 2015-09-12 MED ORDER — ONDANSETRON HCL 4 MG PO TABS
4.0000 mg | ORAL_TABLET | Freq: Four times a day (QID) | ORAL | Status: DC | PRN
Start: 2015-09-12 — End: 2015-09-12

## 2015-09-12 MED ORDER — SODIUM CHLORIDE 0.9% FLUSH: 3.0000 mL | Freq: Two times a day (BID) | INTRAVENOUS | Status: DC

## 2015-09-12 MED ORDER — ROSUVASTATIN CALCIUM 20 MG PO TABS
20.0000 mg | ORAL_TABLET | Freq: Every day | ORAL | Status: DC
  Filled 2015-09-12: qty 1

## 2015-09-12 SURGICAL SUPPLY — 8 items
CATH INFINITI 5FR ANG PIGTAIL (CATHETERS) ×3 IMPLANT
CATH INFINITI 5FR JL4 (CATHETERS) ×3 IMPLANT
CATH INFINITI JR4 5F (CATHETERS) ×3 IMPLANT
KIT MANI 3VAL PERCEP (MISCELLANEOUS) ×3 IMPLANT
NEEDLE PERC 18GX7CM (NEEDLE) ×3 IMPLANT
PACK CARDIAC CATH (CUSTOM PROCEDURE TRAY) ×3 IMPLANT
SHEATH PINNACLE 5F 10CM (SHEATH) ×3 IMPLANT
WIRE EMERALD 3MM-J .035X150CM (WIRE) ×3 IMPLANT

## 2015-09-12 NOTE — Progress Notes (Signed)
Pt discharged home via wheelchair, family at side. IV discontinued without incident. Home medication list and prescription information given. Reviewed home med list with patient. No questions verbalized.

## 2015-09-12 NOTE — H&P (Signed)
[facility] [place] at [facility]   PATIENT NAME: Dominic Mcmillan    MR#:  [id number]  DATE OF BIRTH:  01-05-1946  DATE OF ADMISSION:  09/11/2015  PRIMARY CARE PHYSICIAN: Bari Edward, MD   REQUESTING/REFERRING PHYSICIAN: Derrill Kay, MD  CHIEF COMPLAINT:   Chief Complaint  Patient presents with  . Chest Pain    HISTORY OF PRESENT ILLNESS:  Dominic Mcmillan  is a 70 y.o. male who presents with An episode of angina. Patient has significant history of known coronary disease with 4 prior stents and multiple prior catheterizations. He recently had stress test as well. He is been having angina for the past 4 days, which improved somewhat with nitroglycerin in the ED tonight. Cardiologist on call was contacted from the ED and recommended patient come in for cardiac catheterization. Hospitalists were called for admission  PAST MEDICAL HISTORY:   Past Medical History  Diagnosis Date  . Acute MI (HCC)   . CAD (coronary artery disease)   . HTN (hypertension)   . HLD (hyperlipidemia)     PAST SURGICAL HISTORY:   Past Surgical History  Procedure Laterality Date  . Tonsillectomy    . Nasal sinus surgery    . Cervical spine surgery    . Salivary gland surgery Right 2011    oncocytoma  . Coronary angioplasty with stent placement    . Colonoscopy  2008  . Coronary angioplasty with stent placement      SOCIAL HISTORY:   Social History  Substance Use Topics  . Smoking status: Never Smoker   . Smokeless tobacco: Not on file  . Alcohol Use: No    FAMILY HISTORY:   Family History  Problem Relation Age of Onset  . Hypertension Mother     DRUG ALLERGIES:   Allergies  Allergen Reactions  . Nortriptyline Hcl   . Prazosin Hcl     joint pain  . Quetiapine Fumarate   . Tizanidine     MEDICATIONS AT HOME:   Prior to Admission medications   Medication Sig Start Date End Date Taking? Authorizing Provider  aspirin (ECOTRIN LOW STRENGTH) 81  MG EC tablet Take 1 tablet by mouth daily.   Yes Historical Provider, MD  Cholecalciferol (HM VITAMIN D3) 4000 UNITS CAPS Take 1 capsule by mouth daily.    Yes Historical Provider, MD  clopidogrel (PLAVIX) 75 MG tablet Take 1 tablet by mouth daily.   Yes Historical Provider, MD  co-enzyme Q-10 30 MG capsule Take 30 mg by mouth 3 (three) times daily.   Yes Historical Provider, MD  fluticasone (FLONASE) 50 MCG/ACT nasal spray Place 2 sprays into both nostrils daily. 02/14/15  Yes Reubin Milan, MD  ketoconazole (NIZORAL) 2 % shampoo KETOCONAZOLE, 2% (External Shampoo) - Historical Medication  (2 %) Active   Yes Historical Provider, MD  LORazepam (ATIVAN) 1 MG tablet Take 1 mg by mouth every 8 (eight) hours as needed.    Yes Historical Provider, MD  Multiple Vitamins-Minerals (CENTRUM SILVER) tablet Take 1 tablet by mouth daily.    Yes Historical Provider, MD  nitroGLYCERIN (NITROSTAT) 0.4 MG SL tablet Place under the tongue.   Yes Historical Provider, MD  olmesartan (BENICAR) 20 MG tablet Take 1 tablet by mouth daily.   Yes Historical Provider, MD  rosuvastatin (CRESTOR) 40 MG tablet Take 1 tablet by mouth daily.   Yes Historical Provider, MD  vitamin C (ASCORBIC ACID) 500 MG tablet Take 500 mg by mouth daily.    Yes  Historical Provider, MD    REVIEW OF SYSTEMS:  Review of Systems  Constitutional: Negative for fever, chills, weight loss and malaise/fatigue.  HENT: Negative for ear pain, hearing loss and tinnitus.   Eyes: Negative for blurred vision, double vision, pain and redness.  Respiratory: Positive for shortness of breath. Negative for cough and hemoptysis.   Cardiovascular: Positive for chest pain. Negative for palpitations, orthopnea and leg swelling.  Gastrointestinal: Negative for nausea, vomiting, abdominal pain, diarrhea and constipation.  Genitourinary: Negative for dysuria, frequency and hematuria.  Musculoskeletal: Negative for back pain, joint pain and neck pain.  Skin:        No acne, rash, or lesions  Neurological: Negative for dizziness, tremors, focal weakness and weakness.  Endo/Heme/Allergies: Negative for polydipsia. Does not bruise/bleed easily.  Psychiatric/Behavioral: Negative for depression. The patient is not nervous/anxious and does not have insomnia.      VITAL SIGNS:   Filed Vitals:   09/11/15 2230 09/11/15 2300 09/11/15 2330 09/12/15 0000  BP: 134/72 131/74 135/68 124/80  Pulse: 51 52 51 52  Temp:      TempSrc:      Resp: 19 12 16 18   Height:      Weight:      SpO2: 98% 95% 96% 96%   Wt Readings from Last 3 Encounters:  09/11/15 77.111 kg (170 lb)  08/02/15 74.39 kg (164 lb)  03/29/15 76.159 kg (167 lb 14.4 oz)    PHYSICAL EXAMINATION:  Physical Exam  Vitals reviewed. Constitutional: He is oriented to person, place, and time. He appears well-developed and well-nourished. No distress.  HENT:  Head: Normocephalic and atraumatic.  Mouth/Throat: Oropharynx is clear and moist.  Eyes: Conjunctivae and EOM are normal. Pupils are equal, round, and reactive to light. No scleral icterus.  Neck: Normal range of motion. Neck supple. No JVD present. No thyromegaly present.  Cardiovascular: Normal rate, regular rhythm and intact distal pulses.  Exam reveals no gallop and no friction rub.   No murmur heard. Respiratory: Effort normal and breath sounds normal. No respiratory distress. He has no wheezes. He has no rales.  GI: Soft. Bowel sounds are normal. He exhibits no distension. There is no tenderness.  Musculoskeletal: Normal range of motion. He exhibits no edema.  No arthritis, no gout  Lymphadenopathy:    He has no cervical adenopathy.  Neurological: He is alert and oriented to person, place, and time. No cranial nerve deficit.  No dysarthria, no aphasia  Skin: Skin is warm and dry. No rash noted. No erythema.  Psychiatric: He has a normal mood and affect. His behavior is normal. Judgment and thought content normal.    LABORATORY  PANEL:   CBC  Recent Labs Lab 09/11/15 1924  WBC 4.8  HGB 13.2  HCT 38.3*  PLT 119*   ------------------------------------------------------------------------------------------------------------------  Chemistries   Recent Labs Lab 09/11/15 1924  NA 139  K 3.7  CL 107  CO2 27  GLUCOSE 104*  BUN 16  CREATININE 0.61  CALCIUM 9.2   ------------------------------------------------------------------------------------------------------------------  Cardiac Enzymes  Recent Labs Lab 09/11/15 2231  TROPONINI <0.03   ------------------------------------------------------------------------------------------------------------------  RADIOLOGY:  Dg Chest 2 View  09/11/2015  CLINICAL DATA:  70 year old male with chest pain EXAM: CHEST  2 VIEW COMPARISON:  None. FINDINGS: The heart size and mediastinal contours are within normal limits. Both lungs are clear. The visualized skeletal structures are unremarkable. IMPRESSION: No active cardiopulmonary disease. Electronically Signed   By: Elgie CollardArash  Radparvar M.D.   On: 09/11/2015 20:00  EKG:   Orders placed or performed during the hospital encounter of 09/11/15  . ED EKG within 10 minutes  . ED EKG within 10 minutes    IMPRESSION AND PLAN:  Principal Problem:   Angina pectoris (HCC) - cardiac enzymes in the ED negative 2 so far. Admit the patient to telemetry with cardiac monitoring. Continue to trend enzymes, get a cardiology consult. Active Problems:   Arteriosclerosis of coronary artery - continue home meds, other workup as above.   Essential (primary) hypertension - currently stable, continue home meds   GERD (gastroesophageal reflux disease) - home dose PPI  All the records are reviewed and case discussed with ED provider. Management plans discussed with the patient and/or family.  DVT PROPHYLAXIS: SubQ lovenox  GI PROPHYLAXIS: PPI  ADMISSION STATUS: Observation  CODE STATUS: Full Code Status History    This  patient does not have a recorded code status. Please follow your organizational policy for patients in this situation.      TOTAL TIME TAKING CARE OF THIS PATIENT: 40 minutes.    Yamato Kopf FIELDING 09/12/2015, 12:15 AM  [facility]  Office  [phone]  CC: Primary care physician; Bari Edward, MD

## 2015-09-12 NOTE — Progress Notes (Signed)
Report to telemetry nurse  Check right groin for bleeding or hematoma.  Patient will be on bedrest for 2 hours post sheath pull---out of bed at 15:35.  Bilateral pulses are 2's DP's..Marland Kitchen

## 2015-09-12 NOTE — Progress Notes (Signed)
To cath lab via bed.

## 2015-09-12 NOTE — Consult Note (Signed)
[facility] Cardiology Consultation Note  Patient ID: Dominic Mcmillan, MRN: [id number], DOB/AGE: 1945/07/31 70 y.o. Admit date: 09/11/2015   Date of Consult: 09/12/2015 Primary Physician: Bari Edward, MD Primary Cardiologist:Paraschos  Chief Complaint:  Chief Complaint  Patient presents with  . Chest Pain   Reason for Consult: unstable angina  HPI: 70 y.o. male with previous myocardial infarction and coronary artery disease status post multiple stenting in the past with essential hypertension and mixed hyperlipidemia on appropriate medication management. The patient has had progressive episodes of anginal equivalent with physical activity with pressure in his chest radiating into his back and left arm associated with shortness of breath increasing in frequency or intensity over the last several months. The patient has had a significant amount of worsening in the last several weeks with a stress test last week with continued chest discomfort. The patient had resting chest discomfort in the last several days for which she was seen in the emergency room with an EKG showing normal sinus rhythm and no evidence of myocardial infarction. Patient did have full relief of his chest pain with nitroglycerin and is stable at this time. Patient does have the appropriate medication management for hyperlipidemia and hypertension and remains on appropriate medications  Past Medical History  Diagnosis Date  . Acute MI (HCC)   . CAD (coronary artery disease)   . HTN (hypertension)   . HLD (hyperlipidemia)       Surgical History:  Past Surgical History  Procedure Laterality Date  . Tonsillectomy    . Nasal sinus surgery    . Cervical spine surgery    . Salivary gland surgery Right 2011    oncocytoma  . Coronary angioplasty with stent placement    . Colonoscopy  2008  . Coronary angioplasty with stent placement       Home Meds: Prior to Admission medications   Medication Sig Start Date  End Date Taking? Authorizing Provider  aspirin (ECOTRIN LOW STRENGTH) 81 MG EC tablet Take 1 tablet by mouth daily.   Yes Historical Provider, MD  Cholecalciferol (HM VITAMIN D3) 4000 UNITS CAPS Take 1 capsule by mouth daily.    Yes Historical Provider, MD  clopidogrel (PLAVIX) 75 MG tablet Take 1 tablet by mouth daily.   Yes Historical Provider, MD  co-enzyme Q-10 30 MG capsule Take 30 mg by mouth 3 (three) times daily.   Yes Historical Provider, MD  fluticasone (FLONASE) 50 MCG/ACT nasal spray Place 2 sprays into both nostrils daily. 02/14/15  Yes Reubin Milan, MD  ketoconazole (NIZORAL) 2 % shampoo KETOCONAZOLE, 2% (External Shampoo) - Historical Medication  (2 %) Active   Yes Historical Provider, MD  LORazepam (ATIVAN) 1 MG tablet Take 1 mg by mouth every 8 (eight) hours as needed.    Yes Historical Provider, MD  Multiple Vitamins-Minerals (CENTRUM SILVER) tablet Take 1 tablet by mouth daily.    Yes Historical Provider, MD  nitroGLYCERIN (NITROSTAT) 0.4 MG SL tablet Place under the tongue.   Yes Historical Provider, MD  olmesartan (BENICAR) 20 MG tablet Take 1 tablet by mouth daily.   Yes Historical Provider, MD  rosuvastatin (CRESTOR) 40 MG tablet Take 1 tablet by mouth daily.   Yes Historical Provider, MD  vitamin C (ASCORBIC ACID) 500 MG tablet Take 500 mg by mouth daily.    Yes Historical Provider, MD    Inpatient Medications:  . aspirin EC  81 mg Oral Daily  . clopidogrel  75 mg Oral Daily  .  enoxaparin (LOVENOX) injection  40 mg Subcutaneous Q24H  . irbesartan  150 mg Oral Daily  . LORazepam  2 mg Oral QHS  . rosuvastatin  20 mg Oral Daily  . sodium chloride flush  3 mL Intravenous Q12H   . sodium chloride 75 mL/hr at 09/12/15 0228    Allergies:  Allergies  Allergen Reactions  . Nortriptyline Hcl   . Prazosin Hcl     joint pain  . Quetiapine Fumarate   . Tizanidine     Social History   Social History  . Marital Status: Married    Spouse Name: N/A  . Number of  Children: N/A  . Years of Education: N/A   Occupational History  . Not on file.   Social History Main Topics  . Smoking status: Never Smoker   . Smokeless tobacco: Not on file  . Alcohol Use: No  . Drug Use: No  . Sexual Activity: Not on file   Other Topics Concern  . Not on file   Social History Narrative     Family History  Problem Relation Age of Onset  . Hypertension Mother      Review of Systems Positive forChest pain shortness of breath Negative for: General:  chills, fever, night sweats or weight changes.  Cardiovascular: PND orthopnea syncope dizziness  Dermatological skin lesions rashes Respiratory: Cough congestion Urologic: Frequent urination urination at night and hematuria Abdominal: negative for nausea, vomiting, diarrhea, bright red blood per rectum, melena, or hematemesis Neurologic: negative for visual changes, and/or hearing changes  All other systems reviewed and are otherwise negative except as noted above.  Labs:  Recent Labs  09/11/15 1924 09/11/15 2231 09/12/15 0301 09/12/15 0736  TROPONINI <0.03 <0.03 <0.03 <0.03   Lab Results  Component Value Date   WBC 4.5 09/12/2015   HGB 13.1 09/12/2015   HCT 38.6* 09/12/2015   MCV 97.1 09/12/2015   PLT 112* 09/12/2015    Recent Labs Lab 09/12/15 0736  NA 141  K 4.0  CL 108  CO2 30  BUN 11  CREATININE 0.52*  CALCIUM 8.9  GLUCOSE 88   No results found for: CHOL, HDL, LDLCALC, TRIG No results found for: DDIMER  Radiology/Studies:  Dg Chest 2 View  09/11/2015  CLINICAL DATA:  70 year old male with chest pain EXAM: CHEST  2 VIEW COMPARISON:  None. FINDINGS: The heart size and mediastinal contours are within normal limits. Both lungs are clear. The visualized skeletal structures are unremarkable. IMPRESSION: No active cardiopulmonary disease. Electronically Signed   By: Elgie CollardArash  Radparvar M.D.   On: 09/11/2015 20:00    ZOX:WRUEAVEKG:Normal sinus rhythm  Weights: Filed Weights   09/11/15 1920  09/12/15 0138  Weight: 170 lb (77.111 kg) 163 lb 8 oz (74.163 kg)     Physical Exam: Blood pressure 139/72, pulse 56, temperature 97.9 F (36.6 C), temperature source Oral, resp. rate 16, height 5\' 11"  (1.803 m), weight 163 lb 8 oz (74.163 kg), SpO2 96 %. Body mass index is 22.81 kg/(m^2). General: Well developed, well nourished, in no acute distress. Head eyes ears nose throat: Normocephalic, atraumatic, sclera non-icteric, no xanthomas, nares are without discharge. No apparent thyromegaly and/or mass  Lungs: Normal respiratory effort.  no wheezes, no rales, no rhonchi.  Heart: RRR with normal S1 S2. no murmur gallop, no rub, PMI is normal size and placement, carotid upstroke normal without bruit, jugular venous pressure is normal Abdomen: Soft, non-tender, non-distended with normoactive bowel sounds. No hepatomegaly. No rebound/guarding. No obvious abdominal masses.  Abdominal aorta is normal size without bruit Extremities: No edema. no cyanosis, no clubbing, no ulcers  Peripheral : 2+ bilateral upper extremity pulses, 2+ bilateral femoral pulses, 2+ bilateral dorsal pedal pulse Neuro: Alert and oriented. No facial asymmetry. No focal deficit. Moves all extremities spontaneously. Musculoskeletal: Normal muscle tone without kyphosis Psych:  Responds to questions appropriately with a normal affect.    Assessment: 70 year old male with previous coronary artery disease stenting hypertension hyperlipidemia having [place] class for anginal equivalent without evidence of myocardial infarction  Plan: 1. Continue aspirin nitroglycerin for chest discomfort 2. High intensity cholesterol therapy with Crestor 3. Proceed to cardiac catheterization to assess coronary anatomy and further treatment thereof is necessary. Patient understands the risk and benefits of cardiac catheterization. This includes a possibility of death stroke heart attack infection bleeding or blood clot. He is at low risk for  conscious sedation  Signed, Lamar Blinks M.D. [facility] 09/12/2015, 8:43 AM

## 2015-09-12 NOTE — Progress Notes (Signed)
[facility] Encounter Note  Patient: Dominic Mcmillan / Admit Date: 09/11/2015 / Date of Encounter: 09/12/2015, 1:33 PM   Subjective: No further chest pain. Troponin levels normal. No EKG changes  Review of Systems: Positive for: Shortness of breath chest pressure Negative for: Vision change, hearing change, syncope, dizziness, nausea, vomiting,diarrhea, bloody stool, stomach pain, cough, congestion, diaphoresis, urinary frequency, urinary pain,skin lesions, skin rashes Others previously listed  Objective: Telemetry: Normal sinus rhythm Physical Exam: Blood pressure 163/74, pulse 57, temperature 97.9 F (36.6 C), temperature source Oral, resp. rate 19, height  (1.803 m), weight 163 lb 8 oz (74.163 kg), SpO2 96 %. Body mass index is 22.81 kg/(m^2). General: Well developed, well nourished, in no acute distress. Head: Normocephalic, atraumatic, sclera non-icteric, no xanthomas, nares are without discharge. Neck: No apparent masses Lungs: Normal respirations with no wheezes, no rhonchi, no rales , no crackles   Heart: Regular rate and rhythm, normal S1 S2, no murmur, no rub, no gallop, PMI is normal size and placement, carotid upstroke normal without bruit, jugular venous pressure normal Abdomen: Soft, non-tender, non-distended with normoactive bowel sounds. No hepatosplenomegaly. Abdominal aorta is normal size without bruit Extremities: No edema, no clubbing, no cyanosis, no ulcers,  Peripheral: 2+ radial, 2+ femoral, 2+ dorsal pedal pulses Neuro: Alert and oriented. Moves all extremities spontaneously. Psych:  Responds to questions appropriately with a normal affect.   Intake/Output Summary (Last 24 hours) at 09/12/15 1333 Last data filed at 09/12/15 0801  Gross per 24 hour  Intake  102.5 ml  Output    150 ml  Net  -47.5 ml    Inpatient Medications:  . [START ON 09/13/2015] aspirin  81 mg Oral Pre-Cath  . [MAR Hold] aspirin EC  81 mg Oral Daily   . [MAR Hold] clopidogrel  75 mg Oral Daily  . [MAR Hold] enoxaparin (LOVENOX) injection  40 mg Subcutaneous Q24H  . [MAR Hold] irbesartan  150 mg Oral Daily  . [MAR Hold] LORazepam  2 mg Oral QHS  . [MAR Hold] rosuvastatin  20 mg Oral Daily  . [MAR Hold] sodium chloride flush  3 mL Intravenous Q12H  . sodium chloride flush  3 mL Intravenous Q12H   Infusions:  . [START ON 09/13/2015] sodium chloride     Followed by  . [START ON 09/13/2015] sodium chloride      Labs:  Recent Labs  09/11/15 1924 09/12/15 0736  NA 139 141  K 3.7 4.0  CL 107 108  CO2 27 30  GLUCOSE 104* 88  BUN 16 11  CREATININE 0.61 0.52*  CALCIUM 9.2 8.9   No results for input(s): AST, ALT, ALKPHOS, BILITOT, PROT, ALBUMIN in the last 72 hours.  Recent Labs  09/11/15 1924 09/12/15 0736  WBC 4.8 4.5  HGB 13.2 13.1  HCT 38.3* 38.6*  MCV 97.4 97.1  PLT 119* 112*    Recent Labs  09/11/15 1924 09/11/15 2231 09/12/15 0301 09/12/15 0736  TROPONINI <0.03 <0.03 <0.03 <0.03   Invalid input(s): POCBNP No results for input(s): HGBA1C in the last 72 hours.   Weights: Filed Weights   09/11/15 1920 09/12/15 0138  Weight: 170 lb (77.111 kg) 163 lb 8 oz (74.163 kg)     Radiology/Studies:  Dg Chest 2 View  09/11/2015  CLINICAL DATA:  70 year old male with chest pain EXAM: CHEST  2 VIEW COMPARISON:  None. FINDINGS: The heart size and mediastinal contours are within normal limits. Both lungs are clear. The visualized skeletal structures  are unremarkable. IMPRESSION: No active cardiopulmonary disease. Electronically Signed   By: Elgie CollardArash  Radparvar M.D.   On: 09/11/2015 20:00     Assessment and Recommendation  70 y.o. male with known essential hypertension mixed hyperlipidemia and previous coronary artery disease status post multiple stents having progressive symptoms of shortness of breath and chest pain with a stress test showing minimal inferoapical myocardial perfusion defect. Cardiac catheterization shows  normal LV systolic function and patent stent of right coronary artery circumflex artery and left anterior descending artery. There is a moderate stenosis of posterior lateral branch noncritical that can be medically managed at this time and may not be causing symptoms at this time 1. Continue medication management for hypertension and further risk reduction of cardiovascular disease 2. Addition of isosorbide for chest discomfort with no critical coronary artery disease 3. Begin ambulation and follow for worsening symptoms 4. Okay for discharge to home from cardiac standpoint with follow-up with Dr. Darrold JunkerParaschos further adjustments of medications  Signed, Arnoldo HookerBruce Shadoe Cryan M.D. FACC

## 2015-09-12 NOTE — Discharge Summary (Signed)
[facility] - [place] [facility]   PATIENT NAME: Dominic Mcmillan    MR#:  [id number]  DATE OF BIRTH:  1946/03/02  DATE OF ADMISSION:  09/11/2015 ADMITTING PHYSICIAN: Oralia Manis, MD  DATE OF DISCHARGE: No discharge date for patient encounter.  PRIMARY CARE PHYSICIAN: Bari Edward, MD     ADMISSION DIAGNOSIS:  Chest pain, unspecified chest pain type [R07.9]  DISCHARGE DIAGNOSIS:  Principal Problem:   Angina pectoris (HCC) Active Problems:   Essential (primary) hypertension   Thrombocytopenia (HCC)   Hyperglycemia   Arteriosclerosis of coronary artery   GERD (gastroesophageal reflux disease)   SECONDARY DIAGNOSIS:   Past Medical History  Diagnosis Date  . Acute MI (HCC)   . CAD (coronary artery disease)   . HTN (hypertension)   . HLD (hyperlipidemia)     .pro HOSPITAL COURSE:   Patient is 70 year old Caucasian male with past history significant for history of coronary artery disease, status post 4 stent placement in the past, who presents to the hospital with 4 day history of chest pain radiating to left arm. Initial EKG revealed sinus rhythm with first-degree AV block, likely septal infarct in the remote past, no acute ST-T changes. Cardiac enzymes were negative. Patient was initiated on lovenox and admitted to the hospital for further evaluation and treatment. Patient was seen by cardiologist and recommended cardiac catheterization to be repeated today. Patient complains of some midsternal mild chest discomfort was no radiation. No alleviating or aggravating factors. Cardiac cath 09/12/15 showed normal LV systolic function , mild 3 vessel coronary artery disease with patent stents of rca lcx and lad. There wasignificant stenosis of posterior lateral branch but not critical with minimal stress test changes Discussion by problem: #1. Unstable angina, continue patient on aspirin, Plavix, Crestor, adding Imdur, unable to use beta blockers due  to bradycardia, cardiac enzymes were negative. The patient underwent  cardiac catheterization by Dr. Gwen Pounds 09/12/15, revealing normal LV systolic function, mild 3 vessel coronary artery disease with patent stents of rca lcx and lad. There wasignificant stenosis of posterior lateral branch but not critical with minimal stress test changes. #2. Essential Hypertension, poorly controled, may need to advance Avapro, follow blood pressure readings closely and advanced blood pressure medications as needed as outpatient, added Imdur.  #3. Thrombocytopenia of unclear etiology, follow as outpatient. #4. Hyperglycemia, resolved  DISCHARGE CONDITIONS:   stable  CONSULTS OBTAINED:  Treatment Team:  Lamar Blinks, MD  DRUG ALLERGIES:   Allergies  Allergen Reactions  . Nortriptyline Hcl   . Prazosin Hcl     joint pain  . Quetiapine Fumarate   . Tizanidine     DISCHARGE MEDICATIONS:   Current Discharge Medication List    START taking these medications   Details  isosorbide mononitrate (IMDUR) 30 MG 24 hr tablet Take 1 tablet (30 mg total) by mouth daily. Qty: 30 tablet, Refills: 5      CONTINUE these medications which have NOT CHANGED   Details  aspirin (ECOTRIN LOW STRENGTH) 81 MG EC tablet Take 1 tablet by mouth daily.    Cholecalciferol (HM VITAMIN D3) 4000 UNITS CAPS Take 1 capsule by mouth daily.     clopidogrel (PLAVIX) 75 MG tablet Take 1 tablet by mouth daily.    co-enzyme Q-10 30 MG capsule Take 30 mg by mouth 3 (three) times daily.    fluticasone (FLONASE) 50 MCG/ACT nasal spray Place 2 sprays into both nostrils daily. Qty: 16 g, Refills: 6   Associated Diagnoses:  Bronchitis    ketoconazole (NIZORAL) 2 % shampoo KETOCONAZOLE, 2% (External Shampoo) - Historical Medication  (2 %) Active    LORazepam (ATIVAN) 1 MG tablet Take 1 mg by mouth every 8 (eight) hours as needed.     Multiple Vitamins-Minerals (CENTRUM SILVER) tablet Take 1 tablet by mouth daily.      nitroGLYCERIN (NITROSTAT) 0.4 MG SL tablet Place under the tongue.    olmesartan (BENICAR) 20 MG tablet Take 1 tablet by mouth daily.    rosuvastatin (CRESTOR) 40 MG tablet Take 1 tablet by mouth daily.    vitamin C (ASCORBIC ACID) 500 MG tablet Take 500 mg by mouth daily.          DISCHARGE INSTRUCTIONS:    Patient is to follow up with PCP cardiology as outpatient  If you experience worsening of your admission symptoms, develop shortness of breath, life threatening emergency, suicidal or homicidal thoughts you must seek medical attention immediately by calling 911 or calling your MD immediately  if symptoms less severe.  You Must read complete instructions/literature along with all the possible adverse reactions/side effects for all the Medicines you take and that have been prescribed to you. Take any new Medicines after you have completely understood and accept all the possible adverse reactions/side effects.   Please note  You were cared for by a hospitalist during your hospital stay. If you have any questions about your discharge medications or the care you received while you were in the hospital after you are discharged, you can call the unit and asked to speak with the hospitalist on call if the hospitalist that took care of you is not available. Once you are discharged, your primary care physician will handle any further medical issues. Please note that NO REFILLS for any discharge medications will be authorized once you are discharged, as it is imperative that you return to your primary care physician (or establish a relationship with a primary care physician if you do not have one) for your aftercare needs so that they can reassess your need for medications and monitor your lab values.    Today   CHIEF COMPLAINT:   Chief Complaint  Patient presents with  . Chest Pain    HISTORY OF PRESENT ILLNESS:  Dominic Mcmillan  is a 70 y.o. male with a known history of  coronary artery disease, status post 4 stent placement in the past, who presents to the hospital with 4 day history of chest pain radiating to left arm. Initial EKG revealed sinus rhythm with first-degree AV block, likely septal infarct in the remote past, no acute ST-T changes. Cardiac enzymes were negative. Patient was initiated on lovenox and admitted to the hospital for further evaluation and treatment. Patient was seen by cardiologist and recommended cardiac catheterization to be repeated today. Patient complains of some midsternal mild chest discomfort was no radiation. No alleviating or aggravating factors. Cardiac cath 09/12/15 showed normal LV systolic function , mild 3 vessel coronary artery disease with patent stents of rca lcx and lad. There wasignificant stenosis of posterior lateral branch but not critical with minimal stress test changes Discussion by problem: #1. Unstable angina, continue patient on aspirin, Plavix, Crestor, adding Imdur, unable to use beta blockers due to bradycardia, cardiac enzymes were negative. The patient underwent  cardiac catheterization by Dr. Gwen PoundsKowalski 09/12/15, revealing normal LV systolic function, mild 3 vessel coronary artery disease with patent stents of rca lcx and lad. There wasignificant stenosis of posterior lateral branch but not critical  with minimal stress test changes. #2. Essential Hypertension, poorly controled, may need to advance Avapro, follow blood pressure readings closely and advanced blood pressure medications as needed as outpatient, added Imdur.  #3. Thrombocytopenia of unclear etiology, follow as outpatient. #4. Hyperglycemia, resolved    VITAL SIGNS:  Blood pressure 144/65, pulse 59, temperature 97.5 F (36.4 C), temperature source Oral, resp. rate 12, height 5\' 11"  (1.803 m), weight 74.163 kg (163 lb 8 oz), SpO2 99 %.  I/O:   Intake/Output Summary (Last 24 hours) at 09/12/15 1612 Last data filed at 09/12/15 1500  Gross per 24 hour   Intake  102.5 ml  Output    500 ml  Net -397.5 ml    PHYSICAL EXAMINATION:  GENERAL:  70 y.o.-year-old patient lying in the bed with no acute distress.  EYES: Pupils equal, round, reactive to light and accommodation. No scleral icterus. Extraocular muscles intact.  HEENT: Head atraumatic, normocephalic. Oropharynx and nasopharynx clear.  NECK:  Supple, no jugular venous distention. No thyroid enlargement, no tenderness.  LUNGS: Normal breath sounds bilaterally, no wheezing, rales,rhonchi or crepitation. No use of accessory muscles of respiration.  CARDIOVASCULAR: S1, S2 normal. No murmurs, rubs, or gallops.  ABDOMEN: Soft, non-tender, non-distended. Bowel sounds present. No organomegaly or mass.  EXTREMITIES: No pedal edema, cyanosis, or clubbing.  NEUROLOGIC: Cranial nerves II through XII are intact. Muscle strength 5/5 in all extremities. Sensation intact. Gait not checked.  PSYCHIATRIC: The patient is alert and oriented x 3.  SKIN: No obvious rash, lesion, or ulcer.   DATA REVIEW:   CBC  Recent Labs Lab 09/12/15 0736  WBC 4.5  HGB 13.1  HCT 38.6*  PLT 112*    Chemistries   Recent Labs Lab 09/12/15 0736  NA 141  K 4.0  CL 108  CO2 30  GLUCOSE 88  BUN 11  CREATININE 0.52*  CALCIUM 8.9    Cardiac Enzymes  Recent Labs Lab 09/12/15 0736  TROPONINI <0.03    Microbiology Results  No results found for this or any previous visit.  RADIOLOGY:  Dg Chest 2 View  09/11/2015  CLINICAL DATA:  70 year old male with chest pain EXAM: CHEST  2 VIEW COMPARISON:  None. FINDINGS: The heart size and mediastinal contours are within normal limits. Both lungs are clear. The visualized skeletal structures are unremarkable. IMPRESSION: No active cardiopulmonary disease. Electronically Signed   By: Elgie Collard M.D.   On: 09/11/2015 20:00    EKG:   Orders placed or performed during the hospital encounter of 09/11/15  . ED EKG within 10 minutes  . ED EKG within 10 minutes       Management plans discussed with the patient, family and they are in agreement.  CODE STATUS:     Code Status Orders        Start     Ordered   09/12/15 0135  Full code   Continuous     09/12/15 0134    Code Status History    Date Active Date Inactive Code Status Order ID Comments User Context   This patient has a current code status but no historical code status.      TOTAL TIME TAKING CARE OF THIS PATIENT: 40 minutes   Karilynn Carranza M.D on 09/12/2015 at 4:12 PM  Between 7am to 6pm - Pager - [phone]  After 6pm go to [email or web address] - password [email or web address]  [facility]  Office  [phone]  CC: Primary care physician; Bari Edward, MD

## 2015-09-12 NOTE — Progress Notes (Signed)
Pt is status post cardiac catherization. Right groin dressing is clean, dry and intact. No hematoma noted. Pulses positive bilateral. I will continue to assess.

## 2015-09-12 NOTE — Progress Notes (Signed)
[facility] - [place] at [facility]   PATIENT NAME: Dominic Mcmillan    MR#:  [id number]  DATE OF BIRTH:  October 19, 1945  SUBJECTIVE:  CHIEF COMPLAINT:   Chief Complaint  Patient presents with  . Chest Pain  Patient is 70 year old Caucasian male with past history significant for history of coronary artery disease, status post 4 stent placement in the past, who presents to the hospital with 4 day history of chest pain radiating to left arm. Initial EKG revealed sinus rhythm with first-degree AV block, likely septal infarct in the remote past, no acute ST-T changes. Cardiac enzymes were negative. Patient was initiated on heparin IV drip and admitted to the hospital for further evaluation and treatment. Patient was seen by cardiologist and recommended cardiac catheterization to be repeated today. Patient complains of some midsternal mild chest discomfort was no radiation. No alleviating or aggravating factors.   Review of Systems  Constitutional: Negative for fever, chills and weight loss.  HENT: Negative for congestion.   Eyes: Negative for blurred vision and double vision.  Respiratory: Negative for cough, sputum production, shortness of breath and wheezing.   Cardiovascular: Positive for chest pain. Negative for palpitations, orthopnea, leg swelling and PND.  Gastrointestinal: Negative for nausea, vomiting, abdominal pain, diarrhea, constipation and blood in stool.  Genitourinary: Negative for dysuria, urgency, frequency and hematuria.  Musculoskeletal: Negative for falls.  Neurological: Negative for dizziness, tremors, focal weakness and headaches.  Endo/Heme/Allergies: Does not bruise/bleed easily.  Psychiatric/Behavioral: Negative for depression. The patient does not have insomnia.     VITAL SIGNS: Blood pressure 158/79, pulse 65, temperature 97.9 F (36.6 C), temperature source Oral, resp. rate 20, height  (1.803 m), weight 74.163 kg (163 lb 8 oz), SpO2  97 %.  PHYSICAL EXAMINATION:   GENERAL:  70 y.o.-year-old patient lying in the bed with no acute distress.  EYES: Pupils equal, round, reactive to light and accommodation. No scleral icterus. Extraocular muscles intact.  HEENT: Head atraumatic, normocephalic. Oropharynx and nasopharynx clear.  NECK:  Supple, no jugular venous distention. No thyroid enlargement, no tenderness.  LUNGS: Normal breath sounds bilaterally, no wheezing, rales,rhonchi or crepitation. No use of accessory muscles of respiration.  CARDIOVASCULAR: S1, S2 normal. No murmurs, rubs, or gallops.  ABDOMEN: Soft, nontender, nondistended. Bowel sounds present. No organomegaly or mass.  EXTREMITIES: No pedal edema, cyanosis, or clubbing.  NEUROLOGIC: Cranial nerves II through XII are intact. Muscle strength 5/5 in all extremities. Sensation intact. Gait not checked.  PSYCHIATRIC: The patient is alert and oriented x 3.  SKIN: No obvious rash, lesion, or ulcer.   ORDERS/RESULTS REVIEWED:   CBC  Recent Labs Lab 09/11/15 1924 09/12/15 0736  WBC 4.8 4.5  HGB 13.2 13.1  HCT 38.3* 38.6*  PLT 119* 112*  MCV 97.4 97.1  MCH 33.5 33.0  MCHC 34.5 34.0  RDW 14.2 14.1   ------------------------------------------------------------------------------------------------------------------  Chemistries   Recent Labs Lab 09/11/15 1924 09/12/15 0736  NA 139 141  K 3.7 4.0  CL 107 108  CO2 27 30  GLUCOSE 104* 88  BUN 16 11  CREATININE 0.61 0.52*  CALCIUM 9.2 8.9   ------------------------------------------------------------------------------------------------------------------ estimated creatinine clearance is 88.9 mL/min (by C-G formula based on Cr of 0.52). ------------------------------------------------------------------------------------------------------------------ No results for input(s): TSH, T4TOTAL, T3FREE, THYROIDAB in the last 72 hours.  Invalid input(s): FREET3  Cardiac Enzymes  Recent Labs Lab  09/11/15 2231 09/12/15 0301 09/12/15 0736  TROPONINI <0.03 <0.03 <0.03   ------------------------------------------------------------------------------------------------------------------ Invalid input(s): POCBNP ---------------------------------------------------------------------------------------------------------------  RADIOLOGY: Dg Chest 2 View  09/11/2015  CLINICAL DATA:  70 year old male with chest pain EXAM: CHEST  2 VIEW COMPARISON:  None. FINDINGS: The heart size and mediastinal contours are within normal limits. Both lungs are clear. The visualized skeletal structures are unremarkable. IMPRESSION: No active cardiopulmonary disease. Electronically Signed   By: Elgie CollardArash  Radparvar M.D.   On: 09/11/2015 20:00    EKG:  Orders placed or performed during the hospital encounter of 09/11/15  . ED EKG within 10 minutes  . ED EKG within 10 minutes    ASSESSMENT AND PLAN:  Principal Problem:   Angina pectoris (HCC) Active Problems:   Arteriosclerosis of coronary artery   Essential (primary) hypertension   GERD (gastroesophageal reflux disease) #1. Unstable angina, continue patient on aspirin, Plavix, Crestor, unable to use beta blockers due to bradycardia, cardiac enzymes that were negative. The patient is going to go to cardiac catheterization lab by Dr. Gwen PoundsKowalski, further discharge planning depending on cardiac catheterization results. #2. Essential Hypertension, poorly controlled, continue Avapro, follow blood pressure readings closely and advanced blood pressure medications as needed #3. Thrombocytopenia of unclear etiology, follow in the morning #4. Hyperglycemia, resolved   Management plans discussed with the patient, family and they are in agreement.   DRUG ALLERGIES:  Allergies  Allergen Reactions  . Nortriptyline Hcl   . Prazosin Hcl     joint pain  . Quetiapine Fumarate   . Tizanidine     CODE STATUS:     Code Status Orders        Start     Ordered    09/12/15 0135  Full code   Continuous     09/12/15 0134    Code Status History    Date Active Date Inactive Code Status Order ID Comments User Context   This patient has a current code status but no historical code status.      TOTAL TIME TAKING CARE OF THIS PATIENT: 35 minutes.    Katharina CaperVAICKUTE,Milbert Bixler M.D on 09/12/2015 at 12:45 PM  Between 7am to 6pm - Pager - [phone]  After 6pm go to [email or web address] - password [email or web address] [facility]  [facility]  Office  [phone]  CC: Primary care physician; Bari EdwardLaura Berglund, MD

## 2015-09-13 ENCOUNTER — Encounter: Payer: Self-pay | Admitting: Internal Medicine

## 2015-09-14 DIAGNOSIS — R079 Chest pain, unspecified: Secondary | ICD-10-CM | POA: Diagnosis not present

## 2015-09-14 DIAGNOSIS — E7800 Pure hypercholesterolemia, unspecified: Secondary | ICD-10-CM | POA: Diagnosis not present

## 2015-09-14 DIAGNOSIS — I2510 Atherosclerotic heart disease of native coronary artery without angina pectoris: Secondary | ICD-10-CM | POA: Diagnosis not present

## 2015-09-14 DIAGNOSIS — F4310 Post-traumatic stress disorder, unspecified: Secondary | ICD-10-CM | POA: Diagnosis not present

## 2015-09-14 DIAGNOSIS — I10 Essential (primary) hypertension: Secondary | ICD-10-CM | POA: Diagnosis not present

## 2015-09-14 DIAGNOSIS — Z9889 Other specified postprocedural states: Secondary | ICD-10-CM | POA: Diagnosis not present

## 2015-10-09 DIAGNOSIS — D485 Neoplasm of uncertain behavior of skin: Secondary | ICD-10-CM | POA: Diagnosis not present

## 2015-10-10 DIAGNOSIS — L821 Other seborrheic keratosis: Secondary | ICD-10-CM | POA: Diagnosis not present

## 2015-10-10 DIAGNOSIS — Z872 Personal history of diseases of the skin and subcutaneous tissue: Secondary | ICD-10-CM | POA: Diagnosis not present

## 2015-10-10 DIAGNOSIS — L600 Ingrowing nail: Secondary | ICD-10-CM | POA: Diagnosis not present

## 2015-10-10 DIAGNOSIS — Z1283 Encounter for screening for malignant neoplasm of skin: Secondary | ICD-10-CM | POA: Diagnosis not present

## 2015-10-10 DIAGNOSIS — Z85828 Personal history of other malignant neoplasm of skin: Secondary | ICD-10-CM | POA: Diagnosis not present

## 2015-10-10 DIAGNOSIS — Z08 Encounter for follow-up examination after completed treatment for malignant neoplasm: Secondary | ICD-10-CM | POA: Diagnosis not present

## 2015-10-10 DIAGNOSIS — D485 Neoplasm of uncertain behavior of skin: Secondary | ICD-10-CM | POA: Diagnosis not present

## 2015-11-08 DIAGNOSIS — D225 Melanocytic nevi of trunk: Secondary | ICD-10-CM | POA: Diagnosis not present

## 2015-11-08 DIAGNOSIS — D2261 Melanocytic nevi of right upper limb, including shoulder: Secondary | ICD-10-CM | POA: Diagnosis not present

## 2015-11-08 DIAGNOSIS — D485 Neoplasm of uncertain behavior of skin: Secondary | ICD-10-CM | POA: Diagnosis not present

## 2015-11-13 DIAGNOSIS — R079 Chest pain, unspecified: Secondary | ICD-10-CM | POA: Diagnosis not present

## 2015-11-13 DIAGNOSIS — E7800 Pure hypercholesterolemia, unspecified: Secondary | ICD-10-CM | POA: Diagnosis not present

## 2015-11-13 DIAGNOSIS — Z9889 Other specified postprocedural states: Secondary | ICD-10-CM | POA: Diagnosis not present

## 2015-11-13 DIAGNOSIS — I10 Essential (primary) hypertension: Secondary | ICD-10-CM | POA: Diagnosis not present

## 2015-12-20 DIAGNOSIS — H2513 Age-related nuclear cataract, bilateral: Secondary | ICD-10-CM | POA: Diagnosis not present

## 2015-12-20 DIAGNOSIS — H401123 Primary open-angle glaucoma, left eye, severe stage: Secondary | ICD-10-CM | POA: Diagnosis not present

## 2015-12-20 DIAGNOSIS — H401111 Primary open-angle glaucoma, right eye, mild stage: Secondary | ICD-10-CM | POA: Diagnosis not present

## 2016-01-12 DIAGNOSIS — H401123 Primary open-angle glaucoma, left eye, severe stage: Secondary | ICD-10-CM | POA: Diagnosis not present

## 2016-01-12 DIAGNOSIS — H2513 Age-related nuclear cataract, bilateral: Secondary | ICD-10-CM | POA: Diagnosis not present

## 2016-01-12 DIAGNOSIS — H401111 Primary open-angle glaucoma, right eye, mild stage: Secondary | ICD-10-CM | POA: Diagnosis not present

## 2016-03-04 ENCOUNTER — Encounter: Payer: Self-pay | Admitting: Internal Medicine

## 2016-03-04 ENCOUNTER — Ambulatory Visit (INDEPENDENT_AMBULATORY_CARE_PROVIDER_SITE_OTHER): Payer: Medicare PPO | Admitting: Internal Medicine

## 2016-03-04 VITALS — BP 138/78 | HR 62 | Temp 98.4°F | Resp 16 | Ht 71.0 in | Wt 165.0 lb

## 2016-03-04 DIAGNOSIS — H6981 Other specified disorders of Eustachian tube, right ear: Secondary | ICD-10-CM

## 2016-03-04 DIAGNOSIS — J40 Bronchitis, not specified as acute or chronic: Secondary | ICD-10-CM | POA: Diagnosis not present

## 2016-03-04 MED ORDER — LEVOFLOXACIN 500 MG PO TABS: 500.0000 mg | ORAL_TABLET | Freq: Every day | ORAL | 0 refills | Status: DC

## 2016-03-04 MED ORDER — HYDROCODONE-HOMATROPINE 5-1.5 MG/5ML PO SYRP: 5.0000 mL | ORAL_SOLUTION | Freq: Four times a day (QID) | ORAL | 0 refills | Status: DC | PRN

## 2016-03-04 NOTE — Patient Instructions (Signed)
Resume Flonase nasal spray  Take Tylenol and use heat for ear pain

## 2016-03-04 NOTE — Progress Notes (Signed)
Date:  03/04/2016   Name:  Dominic LorenzoKenneth E Brophy   DOB:  02/25/1946   MRN:  [id number]   Chief Complaint: Cough (1 month) Cough  This is a new problem. The current episode started in the past 7 days. The problem has been unchanged. The problem occurs every few minutes. The cough is productive of purulent sputum. Associated symptoms include ear pain and a sore throat. Pertinent negatives include no chest pain, chills, fever or wheezing. He has tried OTC cough suppressant and cool air for the symptoms.     Review of Systems  Constitutional: Positive for fatigue. Negative for chills, diaphoresis and fever.  HENT: Positive for ear pain and sore throat. Negative for sinus pressure, trouble swallowing and voice change.   Respiratory: Positive for cough. Negative for chest tightness and wheezing.   Cardiovascular: Negative for chest pain.  Gastrointestinal: Negative for abdominal pain, diarrhea and vomiting.    Patient Active Problem List   Diagnosis Date Noted  . Angina pectoris (HCC) 09/12/2015  . Thrombocytopenia (HCC) 09/12/2015  . Hyperglycemia 09/12/2015  . Otitis media 03/28/2015  . Familial multiple lipoprotein-type hyperlipidemia 02/13/2015  . Hearing loss 02/13/2015  . Neurosis, posttraumatic 02/13/2015  . Arteriosclerosis of coronary artery 02/13/2015  . Degeneration of intervertebral disc of lumbar region 02/13/2015  . Essential (primary) hypertension 02/13/2015  . Discoloration of nail 02/13/2015  . GERD (gastroesophageal reflux disease) 03/14/2014  . History of cardiac catheterization 03/11/2014    Prior to Admission medications   Medication Sig Start Date End Date Taking? Authorizing Provider  aspirin (ECOTRIN LOW STRENGTH) 81 MG EC tablet Take 1 tablet by mouth daily.   Yes Historical Provider, MD  brimonidine (ALPHAGAN) 0.2 % ophthalmic solution 3 (three) times daily.   Yes Historical Provider, MD  Cholecalciferol (HM VITAMIN D3) 4000 UNITS CAPS Take 1 capsule by  mouth daily.    Yes Historical Provider, MD  clopidogrel (PLAVIX) 75 MG tablet Take 1 tablet by mouth daily.   Yes Historical Provider, MD  co-enzyme Q-10 30 MG capsule Take 30 mg by mouth 3 (three) times daily.   Yes Historical Provider, MD  DORZOLAMIDE HCL-TIMOLOL MAL OP Apply to eye.   Yes Historical Provider, MD  fluticasone (FLONASE) 50 MCG/ACT nasal spray Place 2 sprays into both nostrils daily. 02/14/15  Yes Reubin MilanLaura H Berglund, MD  isosorbide mononitrate (IMDUR) 30 MG 24 hr tablet Take 1 tablet (30 mg total) by mouth daily. 09/12/15  Yes Katharina Caperima Vaickute, MD  ketoconazole (NIZORAL) 2 % shampoo KETOCONAZOLE, 2% (External Shampoo) - Historical Medication  (2 %) Active   Yes Historical Provider, MD  latanoprost (XALATAN) 0.005 % ophthalmic solution 1 drop at bedtime.   Yes Historical Provider, MD  LORazepam (ATIVAN) 1 MG tablet Take 1 mg by mouth every 8 (eight) hours as needed.    Yes Historical Provider, MD  Multiple Vitamins-Minerals (CENTRUM SILVER) tablet Take 1 tablet by mouth daily.    Yes Historical Provider, MD  nitroGLYCERIN (NITROSTAT) 0.4 MG SL tablet Place under the tongue.   Yes Historical Provider, MD  olmesartan (BENICAR) 20 MG tablet Take 1 tablet by mouth daily.   Yes Historical Provider, MD  vitamin C (ASCORBIC ACID) 500 MG tablet Take 500 mg by mouth daily.    Yes Historical Provider, MD    Allergies  Allergen Reactions  . Nortriptyline Hcl   . Prazosin Hcl     joint pain  . Quetiapine Fumarate   . Tizanidine     Past  Surgical History:  Procedure Laterality Date  . CARDIAC CATHETERIZATION N/A 09/12/2015   Procedure: Left Heart Cath and Coronary Angiography;  Surgeon: Lamar BlinksBruce J Kowalski, MD;  Location: [facility] INVASIVE CV LAB;  Service: Cardiovascular;  Laterality: N/A;  . CERVICAL SPINE SURGERY    . COLONOSCOPY  2008  . CORONARY ANGIOPLASTY WITH STENT PLACEMENT    . CORONARY ANGIOPLASTY WITH STENT PLACEMENT    . NASAL SINUS SURGERY    . SALIVARY GLAND SURGERY Right 2011    oncocytoma  . TONSILLECTOMY      Social History  Substance Use Topics  . Smoking status: Never Smoker  . Smokeless tobacco: Never Used  . Alcohol use No     Medication list has been reviewed and updated.   Physical Exam  Constitutional: He is oriented to person, place, and time. He appears well-developed. No distress.  HENT:  Head: Normocephalic and atraumatic.  Right Ear: Ear canal normal. Tympanic membrane is retracted. Tympanic membrane is not erythematous.  Nose: Right sinus exhibits no maxillary sinus tenderness. Left sinus exhibits no maxillary sinus tenderness.  Mouth/Throat: Posterior oropharyngeal erythema present. No oropharyngeal exudate or posterior oropharyngeal edema.  Neck: Normal range of motion. Neck supple.  Cardiovascular: Normal rate, regular rhythm and normal heart sounds.   Pulmonary/Chest: Effort normal and breath sounds normal. No respiratory distress. He has no wheezes.  Musculoskeletal: Normal range of motion.  Lymphadenopathy:    He has no cervical adenopathy.  Neurological: He is alert and oriented to person, place, and time.  Skin: Skin is warm and dry. No rash noted.  Psychiatric: He has a normal mood and affect. His behavior is normal. Thought content normal.  Nursing note and vitals reviewed.   BP 138/78   Pulse 62   Temp 98.4 F (36.9 C)   Resp 16   Ht 5\' 11"  (1.803 m)   Wt 165 lb (74.8 kg)   SpO2 96%   BMI 23.01 kg/m   Assessment and Plan: 1. Bronchitis - levofloxacin (LEVAQUIN) 500 MG tablet; Take 1 tablet (500 mg total) by mouth daily.  Dispense: 10 tablet; Refill: 0 - HYDROcodone-homatropine (HYCODAN) 5-1.5 MG/5ML syrup; Take 5 mLs by mouth every 6 (six) hours as needed for cough.  Dispense: 120 mL; Refill: 0  2. Eustachian tube dysfunction, right Resume Flonase, use sudafed, analgesics, heat   Bari EdwardLaura Berglund, MD [facility]  03/04/2016

## 2016-04-12 DIAGNOSIS — H401111 Primary open-angle glaucoma, right eye, mild stage: Secondary | ICD-10-CM | POA: Diagnosis not present

## 2016-04-12 DIAGNOSIS — H401123 Primary open-angle glaucoma, left eye, severe stage: Secondary | ICD-10-CM | POA: Diagnosis not present

## 2016-05-20 DIAGNOSIS — D485 Neoplasm of uncertain behavior of skin: Secondary | ICD-10-CM | POA: Diagnosis not present

## 2016-05-20 DIAGNOSIS — L570 Actinic keratosis: Secondary | ICD-10-CM | POA: Diagnosis not present

## 2016-05-20 DIAGNOSIS — Z86018 Personal history of other benign neoplasm: Secondary | ICD-10-CM | POA: Diagnosis not present

## 2016-05-20 DIAGNOSIS — Z85828 Personal history of other malignant neoplasm of skin: Secondary | ICD-10-CM | POA: Diagnosis not present

## 2016-06-03 ENCOUNTER — Ambulatory Visit (INDEPENDENT_AMBULATORY_CARE_PROVIDER_SITE_OTHER): Payer: Medicare PPO | Admitting: Internal Medicine

## 2016-06-03 ENCOUNTER — Encounter: Payer: Self-pay | Admitting: Internal Medicine

## 2016-06-03 VITALS — BP 160/80 | HR 94 | Temp 101.1°F | Ht 71.0 in | Wt 163.0 lb

## 2016-06-03 DIAGNOSIS — R69 Illness, unspecified: Secondary | ICD-10-CM | POA: Diagnosis not present

## 2016-06-03 DIAGNOSIS — J111 Influenza due to unidentified influenza virus with other respiratory manifestations: Secondary | ICD-10-CM

## 2016-06-03 MED ORDER — HYDROCODONE-HOMATROPINE 5-1.5 MG/5ML PO SYRP: 5.0000 mL | ORAL_SOLUTION | Freq: Four times a day (QID) | ORAL | 0 refills | Status: DC | PRN

## 2016-06-03 MED ORDER — ONDANSETRON 4 MG PO TBDP: 4.0000 mg | ORAL_TABLET | Freq: Three times a day (TID) | ORAL | 0 refills | Status: DC | PRN

## 2016-06-03 MED ORDER — AZITHROMYCIN 250 MG PO TABS: ORAL_TABLET | ORAL | 0 refills | Status: DC

## 2016-06-03 MED ORDER — OSELTAMIVIR PHOSPHATE 75 MG PO CAPS: 75.0000 mg | ORAL_CAPSULE | Freq: Two times a day (BID) | ORAL | 0 refills | Status: DC

## 2016-06-03 NOTE — Progress Notes (Signed)
Date:  06/03/2016   Name:  Dominic Mcmillan   DOB:  04/26/1946   MRN:  [id number]   Chief Complaint: Sinus Problem (Pt stated sinus pressure, body, fatique, chills, sweating, dizzy, mucus for 4 days) Sinus Problem  This is a new problem. The current episode started in the past 7 days. The problem has been gradually worsening since onset. The maximum temperature recorded prior to his arrival was 100.4 - 100.9 F. Associated symptoms include chills, congestion, coughing, headaches, sinus pressure and a sore throat. Pertinent negatives include no shortness of breath. Past treatments include acetaminophen.  Influenza  This is a new problem. The current episode started yesterday. The problem occurs constantly. The problem has been gradually worsening. Associated symptoms include chills, congestion, coughing, headaches, nausea and a sore throat. Pertinent negatives include no abdominal pain, chest pain or numbness. He has tried rest for the symptoms.      Review of Systems  Constitutional: Positive for chills.  HENT: Positive for congestion, sinus pressure, sore throat and trouble swallowing.   Eyes: Negative for visual disturbance.  Respiratory: Positive for cough. Negative for chest tightness, shortness of breath and wheezing.   Cardiovascular: Negative for chest pain and palpitations.  Gastrointestinal: Positive for nausea. Negative for abdominal pain and constipation.  Neurological: Positive for dizziness and headaches. Negative for numbness.    Patient Active Problem List   Diagnosis Date Noted  . Angina pectoris (HCC) 09/12/2015  . Thrombocytopenia (HCC) 09/12/2015  . Hyperglycemia 09/12/2015  . Otitis media 03/28/2015  . Familial multiple lipoprotein-type hyperlipidemia 02/13/2015  . Hearing loss 02/13/2015  . Neurosis, posttraumatic 02/13/2015  . Arteriosclerosis of coronary artery 02/13/2015  . Degeneration of intervertebral disc of lumbar region 02/13/2015  . Essential  (primary) hypertension 02/13/2015  . Discoloration of nail 02/13/2015  . GERD (gastroesophageal reflux disease) 03/14/2014  . History of cardiac catheterization 03/11/2014    Prior to Admission medications   Medication Sig Start Date End Date Taking? Authorizing Provider  aspirin (ECOTRIN LOW STRENGTH) 81 MG EC tablet Take 1 tablet by mouth daily.   Yes Historical Provider, MD  brimonidine (ALPHAGAN) 0.2 % ophthalmic solution 3 (three) times daily.   Yes Historical Provider, MD  Cholecalciferol (HM VITAMIN D3) 4000 UNITS CAPS Take 1 capsule by mouth daily.    Yes Historical Provider, MD  clopidogrel (PLAVIX) 75 MG tablet Take 1 tablet by mouth daily.   Yes Historical Provider, MD  co-enzyme Q-10 30 MG capsule Take 30 mg by mouth 3 (three) times daily.   Yes Historical Provider, MD  DORZOLAMIDE HCL-TIMOLOL MAL OP Apply to eye.   Yes Historical Provider, MD  HYDROcodone-homatropine (HYCODAN) 5-1.5 MG/5ML syrup Take 5 mLs by mouth every 6 (six) hours as needed for cough. 03/04/16  Yes Reubin Milan, MD  isosorbide mononitrate (IMDUR) 30 MG 24 hr tablet Take 1 tablet (30 mg total) by mouth daily. 09/12/15  Yes Katharina Caper, MD  ketoconazole (NIZORAL) 2 % shampoo KETOCONAZOLE, 2% (External Shampoo) - Historical Medication  (2 %) Active   Yes Historical Provider, MD  latanoprost (XALATAN) 0.005 % ophthalmic solution 1 drop at bedtime.   Yes Historical Provider, MD  LORazepam (ATIVAN) 1 MG tablet Take 1 mg by mouth every 8 (eight) hours as needed.    Yes Historical Provider, MD  Multiple Vitamins-Minerals (CENTRUM SILVER) tablet Take 1 tablet by mouth daily.    Yes Historical Provider, MD  nitroGLYCERIN (NITROSTAT) 0.4 MG SL tablet Place under the tongue.  Yes Historical Provider, MD  olmesartan (BENICAR) 20 MG tablet Take 1 tablet by mouth daily.   Yes Historical Provider, MD  vitamin C (ASCORBIC ACID) 500 MG tablet Take 500 mg by mouth daily.    Yes Historical Provider, MD    Allergies    Allergen Reactions  . Nortriptyline Hcl   . Prazosin Hcl     joint pain  . Quetiapine Fumarate   . Tizanidine     Past Surgical History:  Procedure Laterality Date  . CARDIAC CATHETERIZATION N/A 09/12/2015   Procedure: Left Heart Cath and Coronary Angiography;  Surgeon: Lamar BlinksBruce J Kowalski, MD;  Location: [facility] INVASIVE CV LAB;  Service: Cardiovascular;  Laterality: N/A;  . CERVICAL SPINE SURGERY    . COLONOSCOPY  2008  . CORONARY ANGIOPLASTY WITH STENT PLACEMENT    . CORONARY ANGIOPLASTY WITH STENT PLACEMENT    . NASAL SINUS SURGERY    . SALIVARY GLAND SURGERY Right 2011   oncocytoma  . TONSILLECTOMY      Social History  Substance Use Topics  . Smoking status: Never Smoker  . Smokeless tobacco: Never Used  . Alcohol use No     Medication list has been reviewed and updated.   Physical Exam  Constitutional: He has a sickly appearance.  Neck: Normal range of motion. Neck supple.  Cardiovascular: Regular rhythm and normal heart sounds.  Tachycardia present.   Pulmonary/Chest: He has decreased breath sounds. He has no wheezes. He has no rhonchi.  Psychiatric: He has a normal mood and affect.    BP (!) 160/80   Pulse 94   Temp (!) 101.1 F (38.4 C)   Ht 5\' 11"  (1.803 m)   Wt 163 lb (73.9 kg)   SpO2 94%   BMI 22.73 kg/m   Assessment and Plan: 1. Influenza - ondansetron (ZOFRAN ODT) 4 MG disintegrating tablet; Take 1 tablet (4 mg total) by mouth every 8 (eight) hours as needed for nausea or vomiting.  Dispense: 20 tablet; Refill: 0 - oseltamivir (TAMIFLU) 75 MG capsule; Take 1 capsule (75 mg total) by mouth 2 (two) times daily.  Dispense: 10 capsule; Refill: 0 - HYDROcodone-homatropine (HYCODAN) 5-1.5 MG/5ML syrup; Take 5 mLs by mouth every 6 (six) hours as needed for cough.  Dispense: 120 mL; Refill: 0 - azithromycin (ZITHROMAX Z-PAK) 250 MG tablet; UAD  Dispense: 6 each; Refill: 0   Bari EdwardLaura Tran Randle, MD [facility]  06/03/2016

## 2016-06-04 ENCOUNTER — Encounter: Payer: Self-pay | Admitting: Internal Medicine

## 2016-06-04 ENCOUNTER — Emergency Department: Payer: Medicare PPO

## 2016-06-04 ENCOUNTER — Inpatient Hospital Stay
Admission: EM | Admit: 2016-06-04 | Discharge: 2016-06-07 | DRG: 871 | Disposition: A | Discharge status: Home / Self Care | Payer: Medicare PPO | Attending: Internal Medicine | Admitting: Internal Medicine

## 2016-06-04 DIAGNOSIS — R443 Hallucinations, unspecified: Secondary | ICD-10-CM | POA: Diagnosis not present

## 2016-06-04 DIAGNOSIS — Z8249 Family history of ischemic heart disease and other diseases of the circulatory system: Secondary | ICD-10-CM

## 2016-06-04 DIAGNOSIS — F4310 Post-traumatic stress disorder, unspecified: Secondary | ICD-10-CM | POA: Diagnosis present

## 2016-06-04 DIAGNOSIS — I2510 Atherosclerotic heart disease of native coronary artery without angina pectoris: Secondary | ICD-10-CM | POA: Diagnosis not present

## 2016-06-04 DIAGNOSIS — I10 Essential (primary) hypertension: Secondary | ICD-10-CM | POA: Diagnosis not present

## 2016-06-04 DIAGNOSIS — A419 Sepsis, unspecified organism: Principal | ICD-10-CM | POA: Diagnosis present

## 2016-06-04 DIAGNOSIS — R410 Disorientation, unspecified: Secondary | ICD-10-CM | POA: Diagnosis not present

## 2016-06-04 DIAGNOSIS — J9601 Acute respiratory failure with hypoxia: Secondary | ICD-10-CM | POA: Diagnosis present

## 2016-06-04 DIAGNOSIS — R4182 Altered mental status, unspecified: Secondary | ICD-10-CM | POA: Diagnosis not present

## 2016-06-04 DIAGNOSIS — E785 Hyperlipidemia, unspecified: Secondary | ICD-10-CM | POA: Diagnosis present

## 2016-06-04 DIAGNOSIS — Z7982 Long term (current) use of aspirin: Secondary | ICD-10-CM

## 2016-06-04 DIAGNOSIS — Z955 Presence of coronary angioplasty implant and graft: Secondary | ICD-10-CM

## 2016-06-04 DIAGNOSIS — G9341 Metabolic encephalopathy: Secondary | ICD-10-CM | POA: Diagnosis not present

## 2016-06-04 DIAGNOSIS — K219 Gastro-esophageal reflux disease without esophagitis: Secondary | ICD-10-CM | POA: Diagnosis present

## 2016-06-04 DIAGNOSIS — R918 Other nonspecific abnormal finding of lung field: Secondary | ICD-10-CM | POA: Diagnosis not present

## 2016-06-04 DIAGNOSIS — H409 Unspecified glaucoma: Secondary | ICD-10-CM | POA: Diagnosis present

## 2016-06-04 DIAGNOSIS — H9190 Unspecified hearing loss, unspecified ear: Secondary | ICD-10-CM | POA: Diagnosis present

## 2016-06-04 DIAGNOSIS — J189 Pneumonia, unspecified organism: Secondary | ICD-10-CM | POA: Diagnosis not present

## 2016-06-04 DIAGNOSIS — R0902 Hypoxemia: Secondary | ICD-10-CM | POA: Diagnosis present

## 2016-06-04 DIAGNOSIS — E871 Hypo-osmolality and hyponatremia: Secondary | ICD-10-CM | POA: Diagnosis not present

## 2016-06-04 DIAGNOSIS — Z888 Allergy status to other drugs, medicaments and biological substances status: Secondary | ICD-10-CM | POA: Diagnosis not present

## 2016-06-04 DIAGNOSIS — R509 Fever, unspecified: Secondary | ICD-10-CM | POA: Diagnosis not present

## 2016-06-04 DIAGNOSIS — Z7902 Long term (current) use of antithrombotics/antiplatelets: Secondary | ICD-10-CM

## 2016-06-04 HISTORY — DX: Post-traumatic stress disorder, unspecified: F43.10

## 2016-06-04 HISTORY — DX: Sepsis, unspecified organism: A41.9

## 2016-06-04 HISTORY — DX: Unspecified glaucoma: H40.9

## 2016-06-04 LAB — CBC WITH DIFFERENTIAL/PLATELET
BASOS PCT: 0 %
Basophils Absolute: 0 10*3/uL (ref 0–0.1)
EOS PCT: 0 %
Eosinophils Absolute: 0 10*3/uL (ref 0–0.7)
HCT: 35.7 % — ABNORMAL LOW (ref 40.0–52.0)
Hemoglobin: 12.3 g/dL — ABNORMAL LOW (ref 13.0–18.0)
Lymphocytes Relative: 2 %
Lymphs Abs: 0.6 10*3/uL — ABNORMAL LOW (ref 1.0–3.6)
MCH: 33 pg (ref 26.0–34.0)
MCHC: 34.6 g/dL (ref 32.0–36.0)
MCV: 95.4 fL (ref 80.0–100.0)
MONO ABS: 2 10*3/uL — AB (ref 0.2–1.0)
MONOS PCT: 8 %
Neutro Abs: 22.5 10*3/uL — ABNORMAL HIGH (ref 1.4–6.5)
Neutrophils Relative %: 90 %
PLATELETS: 154 10*3/uL (ref 150–440)
RBC: 3.74 MIL/uL — ABNORMAL LOW (ref 4.40–5.90)
RDW: 13.4 % (ref 11.5–14.5)
WBC: 25.1 10*3/uL — ABNORMAL HIGH (ref 3.8–10.6)

## 2016-06-04 LAB — COMPREHENSIVE METABOLIC PANEL
ALBUMIN: 3.2 g/dL — AB (ref 3.5–5.0)
ALK PHOS: 53 U/L (ref 38–126)
ALT: 13 U/L — AB (ref 17–63)
AST: 25 U/L (ref 15–41)
Anion gap: 8 (ref 5–15)
BILIRUBIN TOTAL: 0.8 mg/dL (ref 0.3–1.2)
BUN: 20 mg/dL (ref 6–20)
CALCIUM: 8.6 mg/dL — AB (ref 8.9–10.3)
CO2: 26 mmol/L (ref 22–32)
Chloride: 101 mmol/L (ref 101–111)
Creatinine, Ser: 0.79 mg/dL (ref 0.61–1.24)
GFR calc Af Amer: >60 mL/min (ref >60)
GFR calc non Af Amer: >60 mL/min (ref >60)
GLUCOSE: 134 mg/dL — AB (ref 65–99)
Potassium: 3.8 mmol/L (ref 3.5–5.1)
Sodium: 135 mmol/L (ref 135–145)
TOTAL PROTEIN: 6.5 g/dL (ref 6.5–8.1)

## 2016-06-04 LAB — URINALYSIS, ROUTINE W REFLEX MICROSCOPIC
BILIRUBIN URINE: NEGATIVE
Bacteria, UA: NONE SEEN
Glucose, UA: NEGATIVE mg/dL
Hgb urine dipstick: NEGATIVE
Ketones, ur: NEGATIVE mg/dL
LEUKOCYTES UA: NEGATIVE
Nitrite: NEGATIVE
PH: 5 (ref 5.0–8.0)
Protein, ur: 100 mg/dL — AB
SPECIFIC GRAVITY, URINE: 1.019 (ref 1.005–1.030)
SQUAMOUS EPITHELIAL / LPF: NONE SEEN

## 2016-06-04 LAB — INFLUENZA PANEL BY PCR (TYPE A & B)
Influenza A By PCR: NEGATIVE
Influenza B By PCR: NEGATIVE

## 2016-06-04 LAB — LACTIC ACID, PLASMA: Lactic Acid, Venous: 1.2 mmol/L (ref 0.5–1.9)

## 2016-06-04 MED ORDER — ASPIRIN EC 81 MG PO TBEC
81.0000 mg | DELAYED_RELEASE_TABLET | Freq: Every day | ORAL | Status: DC
  Administered 2016-06-04 – 2016-06-06 (×3): 81 mg via ORAL
  Filled 2016-06-04 (×4): qty 1

## 2016-06-04 MED ORDER — IRBESARTAN 150 MG PO TABS
150.0000 mg | ORAL_TABLET | Freq: Every day | ORAL | Status: DC
Start: 2016-06-04 — End: 2016-06-05
  Administered 2016-06-04: 150 mg via ORAL
  Filled 2016-06-04 (×2): qty 1

## 2016-06-04 MED ORDER — PIPERACILLIN-TAZOBACTAM 3.375 G IVPB 30 MIN
3.3750 g | Freq: Once | INTRAVENOUS | Status: AC
  Administered 2016-06-04: 3.375 g via INTRAVENOUS
  Filled 2016-06-04: qty 50

## 2016-06-04 MED ORDER — TIMOLOL MALEATE 0.5 % OP SOLN
1.0000 [drp] | Freq: Two times a day (BID) | OPHTHALMIC | Status: DC
Start: 2016-06-04 — End: 2016-06-07
  Administered 2016-06-04 – 2016-06-07 (×3): 1 [drp] via OPHTHALMIC
  Filled 2016-06-04: qty 5

## 2016-06-04 MED ORDER — ONDANSETRON HCL 4 MG PO TABS
4.0000 mg | ORAL_TABLET | Freq: Four times a day (QID) | ORAL | Status: DC | PRN
  Administered 2016-06-05 – 2016-06-07 (×4): 4 mg via ORAL
  Filled 2016-06-04 (×4): qty 1

## 2016-06-04 MED ORDER — DORZOLAMIDE HCL-TIMOLOL MAL 2-0.5 % OP SOLN
1.0000 [drp] | Freq: Two times a day (BID) | OPHTHALMIC | Status: DC
  Filled 2016-06-04: qty 10

## 2016-06-04 MED ORDER — CEFTRIAXONE SODIUM-DEXTROSE 1-3.74 GM-% IV SOLR
1.0000 g | INTRAVENOUS | Status: DC
  Administered 2016-06-04 – 2016-06-05 (×2): 1 g via INTRAVENOUS
  Filled 2016-06-04 (×3): qty 50

## 2016-06-04 MED ORDER — DORZOLAMIDE HCL 2 % OP SOLN
1.0000 [drp] | Freq: Two times a day (BID) | OPHTHALMIC | Status: DC
  Administered 2016-06-04 – 2016-06-07 (×4): 1 [drp] via OPHTHALMIC
  Filled 2016-06-04: qty 10

## 2016-06-04 MED ORDER — LORAZEPAM 1 MG PO TABS
1.0000 mg | ORAL_TABLET | Freq: Three times a day (TID) | ORAL | Status: DC | PRN
  Administered 2016-06-04 – 2016-06-07 (×4): 1 mg via ORAL
  Filled 2016-06-04 (×4): qty 1

## 2016-06-04 MED ORDER — CLOPIDOGREL BISULFATE 75 MG PO TABS
75.0000 mg | ORAL_TABLET | Freq: Every day | ORAL | Status: DC
  Administered 2016-06-05 – 2016-06-07 (×3): 75 mg via ORAL
  Filled 2016-06-04 (×4): qty 1

## 2016-06-04 MED ORDER — NITROGLYCERIN 0.4 MG SL SUBL: 0.4000 mg | SUBLINGUAL_TABLET | SUBLINGUAL | Status: DC | PRN

## 2016-06-04 MED ORDER — SODIUM CHLORIDE 0.9 % IV SOLN
INTRAVENOUS | Status: DC
  Administered 2016-06-04 – 2016-06-05 (×2): via INTRAVENOUS

## 2016-06-04 MED ORDER — VANCOMYCIN HCL IN DEXTROSE 1-5 GM/200ML-% IV SOLN
1000.0000 mg | Freq: Once | INTRAVENOUS | Status: DC
  Filled 2016-06-04: qty 200

## 2016-06-04 MED ORDER — ENOXAPARIN SODIUM 40 MG/0.4ML ~~LOC~~ SOLN
40.0000 mg | SUBCUTANEOUS | Status: DC
  Filled 2016-06-04: qty 0.4

## 2016-06-04 MED ORDER — ONDANSETRON HCL 4 MG/2ML IJ SOLN: 4.0000 mg | Freq: Four times a day (QID) | INTRAMUSCULAR | Status: DC | PRN

## 2016-06-04 MED ORDER — ACETAMINOPHEN 325 MG PO TABS
650.0000 mg | ORAL_TABLET | Freq: Four times a day (QID) | ORAL | Status: DC | PRN
  Administered 2016-06-05 – 2016-06-07 (×2): 650 mg via ORAL
  Filled 2016-06-04 (×2): qty 2

## 2016-06-04 MED ORDER — ROSUVASTATIN CALCIUM 20 MG PO TABS
20.0000 mg | ORAL_TABLET | Freq: Every day | ORAL | Status: DC
  Administered 2016-06-04 – 2016-06-06 (×3): 20 mg via ORAL
  Filled 2016-06-04 (×4): qty 1

## 2016-06-04 MED ORDER — ACETAMINOPHEN 650 MG RE SUPP: 650.0000 mg | Freq: Four times a day (QID) | RECTAL | Status: DC | PRN

## 2016-06-04 MED ORDER — DOCUSATE SODIUM 100 MG PO CAPS
100.0000 mg | ORAL_CAPSULE | Freq: Two times a day (BID) | ORAL | Status: DC
  Administered 2016-06-04 – 2016-06-07 (×6): 100 mg via ORAL
  Filled 2016-06-04 (×7): qty 1

## 2016-06-04 MED ORDER — VITAMIN C 500 MG PO TABS
1000.0000 mg | ORAL_TABLET | Freq: Every day | ORAL | Status: DC
  Administered 2016-06-04 – 2016-06-07 (×2): 1000 mg via ORAL
  Filled 2016-06-04 (×3): qty 2

## 2016-06-04 MED ORDER — HYDROCODONE-ACETAMINOPHEN 5-325 MG PO TABS: 1.0000 | ORAL_TABLET | Freq: Four times a day (QID) | ORAL | Status: DC | PRN

## 2016-06-04 MED ORDER — BRIMONIDINE TARTRATE 0.2 % OP SOLN
1.0000 [drp] | Freq: Two times a day (BID) | OPHTHALMIC | Status: DC
  Administered 2016-06-04 – 2016-06-07 (×4): 1 [drp] via OPHTHALMIC
  Filled 2016-06-04: qty 5

## 2016-06-04 MED ORDER — VITAMIN D 1000 UNITS PO TABS
2000.0000 [IU] | ORAL_TABLET | Freq: Every day | ORAL | Status: DC
  Administered 2016-06-04 – 2016-06-07 (×2): 2000 [IU] via ORAL
  Filled 2016-06-04 (×3): qty 2

## 2016-06-04 MED ORDER — LATANOPROST 0.005 % OP SOLN
1.0000 [drp] | Freq: Every day | OPHTHALMIC | Status: DC
  Administered 2016-06-04 (×2): 1 [drp] via OPHTHALMIC
  Filled 2016-06-04: qty 2.5

## 2016-06-04 MED ORDER — DEXTROSE 5 % IV SOLN
500.0000 mg | INTRAVENOUS | Status: DC
  Administered 2016-06-04 – 2016-06-05 (×2): 500 mg via INTRAVENOUS
  Filled 2016-06-04 (×3): qty 500

## 2016-06-04 MED ORDER — OSELTAMIVIR PHOSPHATE 75 MG PO CAPS
75.0000 mg | ORAL_CAPSULE | Freq: Two times a day (BID) | ORAL | Status: DC
  Administered 2016-06-04: 75 mg via ORAL
  Filled 2016-06-04 (×2): qty 1

## 2016-06-04 MED ORDER — POLYETHYLENE GLYCOL 3350 17 G PO PACK
17.0000 g | PACK | Freq: Every day | ORAL | Status: DC | PRN
  Administered 2016-06-07: 17 g via ORAL
  Filled 2016-06-04: qty 1

## 2016-06-04 MED ORDER — ADULT MULTIVITAMIN W/MINERALS CH
1.0000 | ORAL_TABLET | Freq: Every day | ORAL | Status: DC
  Administered 2016-06-04 – 2016-06-07 (×2): 1 via ORAL
  Filled 2016-06-04 (×5): qty 1

## 2016-06-04 NOTE — ED Triage Notes (Signed)
Pt here from home via [facility] with altered mental status per wife. Pt dx with flu this week, pt with room air oxygen sat 86%. Pt placed on 2L via [place] with 95% sat.

## 2016-06-04 NOTE — Care Management Note (Signed)
Case Management Note  Patient Details  Name: Dominic Mcmillan MRN: [id number] Date of Birth: 11/15/1945  Subjective/Objective:    Pt. Has [employer] benefitsd and has declined transfer. Waiver signed with help of rn.                Action/Plan:   Expected Discharge Date:                  Expected Discharge Plan:     In-House Referral:     Discharge planning Services     Post Acute Care Choice:    Choice offered to:     DME Arranged:    DME Agency:     HH Arranged:    HH Agency:     Status of Service:     If discussed at [employer] Length of Stay Meetings, dates discussed:    Additional Comments:  Berna BueCheryl Janila Arrazola, RN 06/04/2016, 11:02 AM

## 2016-06-04 NOTE — ED Provider Notes (Signed)
[facility] Emergency Department Provider Note   ____________________________________________    I have reviewed the triage vital signs and the nursing notes.   HISTORY  Chief Complaint Altered Mental Status     HPI Dominic Mcmillan is a 71 y.o. male who presents with complaints of disorientation. Patient does not remember the event but per wife he "started talking nonsense". She attributes this to the fact that he started taking Tamiflu yesterday. He was put on Tamiflu and a Z-Pak because of flulike symptoms. No nausea or vomiting. He also reports he has had a severe cough. No headache or neck deficits   Past Medical History:  Diagnosis Date  . Acute MI   . CAD (coronary artery disease)   . Glaucoma   . HLD (hyperlipidemia)   . HTN (hypertension)   . PTSD (post-traumatic stress disorder)     Patient Active Problem List   Diagnosis Date Noted  . Sepsis (HCC) 06/04/2016  . Angina pectoris (HCC) 09/12/2015  . Thrombocytopenia (HCC) 09/12/2015  . Hyperglycemia 09/12/2015  . Otitis media 03/28/2015  . Familial multiple lipoprotein-type hyperlipidemia 02/13/2015  . Hearing loss 02/13/2015  . Neurosis, posttraumatic 02/13/2015  . Arteriosclerosis of coronary artery 02/13/2015  . Degeneration of intervertebral disc of lumbar region 02/13/2015  . Essential (primary) hypertension 02/13/2015  . Discoloration of nail 02/13/2015  . GERD (gastroesophageal reflux disease) 03/14/2014  . History of cardiac catheterization 03/11/2014    Past Surgical History:  Procedure Laterality Date  . CARDIAC CATHETERIZATION N/A 09/12/2015   Procedure: Left Heart Cath and Coronary Angiography;  Surgeon: Lamar Blinks, MD;  Location: [facility] INVASIVE CV LAB;  Service: Cardiovascular;  Laterality: N/A;  . CERVICAL SPINE SURGERY    . COLONOSCOPY  2008  . CORONARY ANGIOPLASTY WITH STENT PLACEMENT    . CORONARY ANGIOPLASTY WITH STENT PLACEMENT    . NASAL SINUS  SURGERY    . SALIVARY GLAND SURGERY Right 2011   oncocytoma  . TONSILLECTOMY      Prior to Admission medications   Medication Sig Start Date End Date Taking? Authorizing Provider  aspirin (ECOTRIN LOW STRENGTH) 81 MG EC tablet Take 1 tablet by mouth daily.   Yes Historical Provider, MD  brimonidine (ALPHAGAN) 0.2 % ophthalmic solution Place 1 drop into the left eye 2 (two) times daily.    Yes Historical Provider, MD  Cholecalciferol (HM VITAMIN D3) 2000 units CAPS Take 1 capsule by mouth daily.    Yes Historical Provider, MD  clopidogrel (PLAVIX) 75 MG tablet Take 1 tablet by mouth daily.   Yes Historical Provider, MD  co-enzyme Q-10 30 MG capsule Take 30 mg by mouth 3 (three) times daily.   Yes Historical Provider, MD  DORZOLAMIDE HCL-TIMOLOL MAL OP Place 1 drop into the left eye 2 (two) times daily.    Yes Historical Provider, MD  HYDROcodone-acetaminophen (NORCO/VICODIN) 5-325 MG tablet Take 1 tablet by mouth every 6 (six) hours as needed for moderate pain.   Yes Historical Provider, MD  ketoconazole (NIZORAL) 2 % shampoo KETOCONAZOLE, 2% (External Shampoo) - Historical Medication  (2 %) Active   Yes Historical Provider, MD  latanoprost (XALATAN) 0.005 % ophthalmic solution Place 1 drop into both eyes at bedtime.    Yes Historical Provider, MD  LORazepam (ATIVAN) 1 MG tablet Take 1 mg by mouth every 8 (eight) hours as needed for anxiety (PTSD).   Yes Historical Provider, MD  Multiple Vitamins-Minerals (CENTRUM SILVER) tablet Take 1 tablet by mouth daily.  Yes Historical Provider, MD  nitroGLYCERIN (NITROSTAT) 0.4 MG SL tablet Place under the tongue.   Yes Historical Provider, MD  olmesartan (BENICAR) 20 MG tablet Take 1 tablet by mouth daily.   Yes Historical Provider, MD  rosuvastatin (CRESTOR) 40 MG tablet Take 20 mg by mouth daily.   Yes Historical Provider, MD  vitamin C (ASCORBIC ACID) 500 MG tablet Take 1,000 mg by mouth daily.    Yes Historical Provider, MD    HYDROcodone-homatropine (HYCODAN) 5-1.5 MG/5ML syrup Take 5 mLs by mouth every 6 (six) hours as needed for cough. 06/03/16   Reubin MilanLaura H Berglund, MD  isosorbide mononitrate (IMDUR) 30 MG 24 hr tablet Take 1 tablet (30 mg total) by mouth daily. Patient not taking: Reported on 06/04/2016 09/12/15   Katharina Caperima Vaickute, MD  ondansetron (ZOFRAN ODT) 4 MG disintegrating tablet Take 1 tablet (4 mg total) by mouth every 8 (eight) hours as needed for nausea or vomiting. Patient not taking: Reported on 06/04/2016 06/03/16   Reubin MilanLaura H Berglund, MD  oseltamivir (TAMIFLU) 75 MG capsule Take 1 capsule (75 mg total) by mouth 2 (two) times daily. 06/03/16   Reubin MilanLaura H Berglund, MD     Allergies Imdur [isosorbide dinitrate]; Nortriptyline hcl; Prazosin hcl; Quetiapine fumarate; and Tizanidine  Family History  Problem Relation Age of Onset  . Hypertension Mother     Social History Social History  Substance Use Topics  . Smoking status: Never Smoker  . Smokeless tobacco: Never Used  . Alcohol use No    Review of Systems  Constitutional: Positive chills Eyes: No visual changes.   Cardiovascular: Denies chest pain. Respiratory: Denies shortness of breath. Positive cough Gastrointestinal: No abdominal pain.  No nausea, no vomiting.   Genitourinary: Negative for dysuria. Musculoskeletal: Negative for back pain. Skin: Negative for rash. Neurological: Negative for headaches or weakness  10-point ROS otherwise negative.  ____________________________________________   PHYSICAL EXAM:  VITAL SIGNS: ED Triage Vitals [06/04/16 0927]  Enc Vitals Group     BP 121/74     Pulse Rate 91     Resp (!) 25     Temp 99.3 F (37.4 C)     Temp Source Oral     SpO2 95 %     Weight 163 lb (73.9 kg)     Height 5\' 11"  (1.803 m)     Head Circumference      Peak Flow      Pain Score      Pain Loc      Pain Edu?      Excl. in GC?     Constitutional: Alert and oriented. No acute distress. Pleasant and interactive Eyes:  Conjunctivae are normal.  Head: Atraumatic. Nose: No congestion/rhinnorhea. Mouth/Throat: Mucous membranes are moist.   Neck:  Painless ROM Cardiovascular: Normal rate, regular rhythm. Grossly normal heart sounds.  Good peripheral circulation. Respiratory: Normal respiratory effort.  No retractions. Lungs CTAB. Gastrointestinal: Soft and nontender. No distention.  No CVA tenderness. Genitourinary: deferred Musculoskeletal: No lower extremity tenderness nor edema.  Warm and well perfused Neurologic:  Normal speech and language. No gross focal neurologic deficits are appreciated.  Skin:  Skin is warm, dry and intact. No rash noted. Psychiatric: Mood and affect are normal. Speech and behavior are normal.  ____________________________________________   LABS (all labs ordered are listed, but only abnormal results are displayed)  Labs Reviewed  COMPREHENSIVE METABOLIC PANEL - Abnormal; Notable for the following:       Result Value   Glucose, Bld  134 (*)    Calcium 8.6 (*)    Albumin 3.2 (*)    ALT 13 (*)    All other components within normal limits  CBC WITH DIFFERENTIAL/PLATELET - Abnormal; Notable for the following:    WBC 25.1 (*)    RBC 3.74 (*)    Hemoglobin 12.3 (*)    HCT 35.7 (*)    Neutro Abs 22.5 (*)    Lymphs Abs 0.6 (*)    Monocytes Absolute 2.0 (*)    All other components within normal limits  CULTURE, BLOOD (ROUTINE X 2)  CULTURE, BLOOD (ROUTINE X 2)  URINE CULTURE  LACTIC ACID, PLASMA  URINALYSIS, ROUTINE W REFLEX MICROSCOPIC  INFLUENZA PANEL BY PCR (TYPE A & B)   ____________________________________________  EKG  ED ECG REPORT I, Jene Every, the attending physician, personally viewed and interpreted this ECG.  Date: 06/04/2016 Rate: 93 Rhythm: normal sinus rhythm QRS Axis: normal Intervals: normal ST/T Wave abnormalities: normal Conduction Disturbances: none Narrative Interpretation:  unremarkable  ____________________________________________  RADIOLOGY  Chest x-ray consistent with multilobar pneumonia ____________________________________________   PROCEDURES  Procedure(s) performed: No    Critical Care performed: No ____________________________________________   INITIAL IMPRESSION / ASSESSMENT AND PLAN / ED COURSE  Pertinent labs & imaging results that were available during my care of the patient were reviewed by me and considered in my medical decision making (see chart for details).  Patient presents with brief episode of confusion. His temperature is elevated and he has had a severe cough and is mildly hypoxic requiring O2 in the emergency department. Strongly suspect metabolic encephalopathy secondary to infection/pneumonia. We'll obtain chest x-ray labs and reevaluate    Chest x-ray is consistent with pneumonia, broad-spectrum antibiotics started. Lactic is normal. ____________________________________________   FINAL CLINICAL IMPRESSION(S) / ED DIAGNOSES  Final diagnoses:  Confusion  Community acquired pneumonia, unspecified laterality      NEW MEDICATIONS STARTED DURING THIS VISIT:  New Prescriptions   No medications on file     Note:  This document was prepared using Dragon voice recognition software and may include unintentional dictation errors.    Jene Every, MD 06/04/16 1236

## 2016-06-04 NOTE — Progress Notes (Signed)
Pt came to floor at 1442. VSS. Pt was oriented to room and safety plan. Brown socks and TED hose put on pt.

## 2016-06-04 NOTE — H&P (Signed)
[employer] [facility] - [place] [facility]   PATIENT NAME: Dominic Mcmillan    MR#:  [id number]  DATE OF BIRTH:  [birth year]  DATE OF ADMISSION:  06/04/2016  PRIMARY CARE PHYSICIAN: Bari Edward, MD   REQUESTING/REFERRING PHYSICIAN: Dr. Jene Every  CHIEF COMPLAINT:   Chief Complaint  Patient presents with  . Altered Mental Status    HISTORY OF PRESENT ILLNESS:  Dominic Mcmillan  is a 71 y.o. male with a known history of  CAD s/p stents, glaucoma, PTSD, HTN, c-spine surgery brought in from home due to fevers and worsening confusion. Patient had been sick for almost 2 weeks now with cold and sinus infection. Has not been on any antibiotics. However for 2 days now, has had fevers, chills, myalgias, nausea/dry heaves, abdominal pain and headaches. Went to see PCP yesterday and started on tamiflu and azithromycin. Symptoms continued to get worse and actually was noted to be significantly confused this AM and so brought to ER. Noted to be hypoxic needing o2 here. Wbc of 25K and also bilateral pneumonia on CXR. Flu test pending,.  PAST MEDICAL HISTORY:   Past Medical History:  Diagnosis Date  . Acute MI   . CAD (coronary artery disease)   . Glaucoma   . HLD (hyperlipidemia)   . HTN (hypertension)   . PTSD (post-traumatic stress disorder)     PAST SURGICAL HISTORY:   Past Surgical History:  Procedure Laterality Date  . CARDIAC CATHETERIZATION N/A 09/12/2015   Procedure: Left Heart Cath and Coronary Angiography;  Surgeon: Lamar Blinks, MD;  Location: [facility] INVASIVE CV LAB;  Service: Cardiovascular;  Laterality: N/A;  . CERVICAL SPINE SURGERY    . COLONOSCOPY  2008  . CORONARY ANGIOPLASTY WITH STENT PLACEMENT    . CORONARY ANGIOPLASTY WITH STENT PLACEMENT    . NASAL SINUS SURGERY    . SALIVARY GLAND SURGERY Right 2011   oncocytoma  . TONSILLECTOMY      SOCIAL HISTORY:   Social History  Substance Use Topics  . Smoking status: Never Smoker  .  Smokeless tobacco: Never Used  . Alcohol use No    FAMILY HISTORY:   Family History  Problem Relation Age of Onset  . Hypertension Mother     DRUG ALLERGIES:   Allergies  Allergen Reactions  . Imdur [Isosorbide Dinitrate]   . Nortriptyline Hcl   . Prazosin Hcl     joint pain  . Quetiapine Fumarate   . Tizanidine     REVIEW OF SYSTEMS:   Review of Systems  Constitutional: Positive for fever and malaise/fatigue. Negative for chills and weight loss.  HENT: Positive for hearing loss. Negative for ear discharge, ear pain, nosebleeds and tinnitus.   Eyes: Negative for blurred vision, double vision and photophobia.  Respiratory: Positive for cough and shortness of breath. Negative for hemoptysis and wheezing.   Cardiovascular: Negative for chest pain, palpitations, orthopnea and leg swelling.  Gastrointestinal: Positive for abdominal pain and nausea. Negative for constipation, diarrhea, heartburn, melena and vomiting.  Genitourinary: Negative for dysuria, frequency, hematuria and urgency.  Musculoskeletal: Positive for back pain and myalgias. Negative for neck pain.  Skin: Negative for rash.  Neurological: Negative for dizziness, sensory change, speech change, focal weakness and headaches.  Endo/Heme/Allergies: Does not bruise/bleed easily.  Psychiatric/Behavioral: Negative for depression.    MEDICATIONS AT HOME:   Prior to Admission medications   Medication Sig Start Date End Date Taking? Authorizing Provider  aspirin (ECOTRIN LOW STRENGTH) 81 MG EC tablet  Take 1 tablet by mouth daily.   Yes Historical Provider, MD  brimonidine (ALPHAGAN) 0.2 % ophthalmic solution Place 1 drop into the left eye 2 (two) times daily.    Yes Historical Provider, MD  Cholecalciferol (HM VITAMIN D3) 2000 units CAPS Take 1 capsule by mouth daily.    Yes Historical Provider, MD  clopidogrel (PLAVIX) 75 MG tablet Take 1 tablet by mouth daily.   Yes Historical Provider, MD  co-enzyme Q-10 30 MG  capsule Take 30 mg by mouth 3 (three) times daily.   Yes Historical Provider, MD  DORZOLAMIDE HCL-TIMOLOL MAL OP Place 1 drop into the left eye 2 (two) times daily.    Yes Historical Provider, MD  HYDROcodone-acetaminophen (NORCO/VICODIN) 5-325 MG tablet Take 1 tablet by mouth every 6 (six) hours as needed for moderate pain.   Yes Historical Provider, MD  ketoconazole (NIZORAL) 2 % shampoo KETOCONAZOLE, 2% (External Shampoo) - Historical Medication  (2 %) Active   Yes Historical Provider, MD  latanoprost (XALATAN) 0.005 % ophthalmic solution Place 1 drop into both eyes at bedtime.    Yes Historical Provider, MD  LORazepam (ATIVAN) 1 MG tablet Take 1 mg by mouth every 8 (eight) hours as needed for anxiety (PTSD).   Yes Historical Provider, MD  Multiple Vitamins-Minerals (CENTRUM SILVER) tablet Take 1 tablet by mouth daily.    Yes Historical Provider, MD  nitroGLYCERIN (NITROSTAT) 0.4 MG SL tablet Place under the tongue.   Yes Historical Provider, MD  olmesartan (BENICAR) 20 MG tablet Take 1 tablet by mouth daily.   Yes Historical Provider, MD  rosuvastatin (CRESTOR) 40 MG tablet Take 20 mg by mouth daily.   Yes Historical Provider, MD  vitamin C (ASCORBIC ACID) 500 MG tablet Take 1,000 mg by mouth daily.    Yes Historical Provider, MD  HYDROcodone-homatropine (HYCODAN) 5-1.5 MG/5ML syrup Take 5 mLs by mouth every 6 (six) hours as needed for cough. 06/03/16   Reubin MilanLaura H Berglund, MD  isosorbide mononitrate (IMDUR) 30 MG 24 hr tablet Take 1 tablet (30 mg total) by mouth daily. Patient not taking: Reported on 06/04/2016 09/12/15   Katharina Caperima Vaickute, MD  ondansetron (ZOFRAN ODT) 4 MG disintegrating tablet Take 1 tablet (4 mg total) by mouth every 8 (eight) hours as needed for nausea or vomiting. Patient not taking: Reported on 06/04/2016 06/03/16   Reubin MilanLaura H Berglund, MD  oseltamivir (TAMIFLU) 75 MG capsule Take 1 capsule (75 mg total) by mouth 2 (two) times daily. 06/03/16   Reubin MilanLaura H Berglund, MD      VITAL SIGNS:    Blood pressure (!) 113/95, pulse 86, temperature 99.3 F (37.4 C), temperature source Oral, resp. rate (!) 22, height 5\' 11"  (1.803 m), weight 73.9 kg (163 lb), SpO2 100 %.  PHYSICAL EXAMINATION:   Physical Exam  GENERAL:  71 y.o.-year-old patient lying in the bed with no acute distress.  EYES: Pupils equal, round, reactive to light and accommodation. No scleral icterus. Extraocular muscles intact.  HEENT: Head atraumatic, normocephalic. Oropharynx and nasopharynx clear. Hearing aids present NECK:  Supple, no jugular venous distention. No thyroid enlargement, no tenderness.  LUNGS: Normal breath sounds bilaterally, no wheezing, rales or crepitation. Bibasilar rhonchi present. No use of accessory muscles of respiration.  CARDIOVASCULAR: S1, S2 normal. No murmurs, rubs, or gallops.  ABDOMEN: Soft, nontender, nondistended. Bowel sounds present. No organomegaly or mass.  EXTREMITIES: No pedal edema, cyanosis, or clubbing.  NEUROLOGIC: Cranial nerves II through XII are intact. Muscle strength 5/5 in all extremities. Sensation  intact. Gait not checked. Global weakness noted. PSYCHIATRIC: The patient is alert and oriented x 3. Slow to respond still SKIN: No obvious rash, lesion, or ulcer.   LABORATORY PANEL:   CBC  Recent Labs Lab 06/04/16 0939  WBC 25.1*  HGB 12.3*  HCT 35.7*  PLT 154   ------------------------------------------------------------------------------------------------------------------  Chemistries   Recent Labs Lab 06/04/16 0939  NA 135  K 3.8  CL 101  CO2 26  GLUCOSE 134*  BUN 20  CREATININE 0.79  CALCIUM 8.6*  AST 25  ALT 13*  ALKPHOS 53  BILITOT 0.8   ------------------------------------------------------------------------------------------------------------------  Cardiac Enzymes No results for input(s): TROPONINI in the last 168  hours. ------------------------------------------------------------------------------------------------------------------  RADIOLOGY:  Dg Chest Port 1 View  Result Date: 06/04/2016 CLINICAL DATA:  Altered mental status EXAM: PORTABLE CHEST 1 VIEW COMPARISON:  09/11/2015 chest radiograph. FINDINGS: Stable cardiomediastinal silhouette with normal heart size. No pneumothorax. No pleural effusion. There are new patchy hazy opacities in the mid to lower lungs bilaterally. IMPRESSION: New patchy hazy lung opacities in the mid to lower lungs bilaterally, which may represent aspiration or developing multilobar pneumonia. Recommend follow-up chest imaging to resolution. Electronically Signed   By: Delbert Phenix M.D.   On: 06/04/2016 09:52    EKG:   Orders placed or performed during the hospital encounter of 06/04/16  . EKG 12-Lead  . EKG 12-Lead    IMPRESSION AND PLAN:   Draycen Leichter  is a 71 y.o. male with a known history of  CAD s/p stents, glaucoma, PTSD, HTN, c-spine surgery brought in from home due to fevers and worsening confusion.  #1 sepsis-secondary to multilobar pneumonia. Chest x-ray with bibasilar pneumonia noted. -Influenza PCR is pending. Continue Tamiflu. On droplet precautions. -Blood cultures pending. Started on Rocephin and azithromycin for community-acquired pneumonia. -Oxygen support as needed. Patient not on home oxygen. -IV fluids  #2 metabolic encephalopathy-likely from underlying sepsis. Monitor to make sure it's not the side affected Tamiflu. -No focal deficits noted. Improving. Monitor while on antibiotics  #3 CAD-stable at this time. Continue aspirin, Plavix, statin and ARB  #4 glaucoma-continue eyedrops  #5 hypertension-continue his home medications. Patient on Benicar-substituted to Avapro in the hospital  #6 DVT prophylaxis-on Lovenox  Physical therapy consult prior to discharge    All the records are reviewed and case discussed with ED  provider. Management plans discussed with the patient, family and they are in agreement.  CODE STATUS: Full Code  TOTAL TIME TAKING CARE OF THIS PATIENT: 50 minutes.    Chioke Noxon M.D on 06/04/2016 at 12:00 PM  Between 7am to 6pm - Pager - [phone]  After 6pm go to [email or web address] - Social research officer, government  [facility]  Office  [phone]  CC: Primary care physician; Bari Edward, MD

## 2016-06-04 NOTE — Progress Notes (Signed)
Pharmacy Antibiotic Note  Dominic Mcmillan is a 71 y.o. male admitted on 06/04/2016 with sepsis secondary to multilobar PNA.  Pharmacy has been consulted for ceftriaxone dosing. Patient received Zosyn x 1 dose in ED. Patient also receiving Azithromycin 500mg  IV.   Plan: Will start the patient on Ceftriaxone 1g IV daily .   Height: 5\' 11"  (180.3 cm) Weight: 163 lb (73.9 kg) IBW/kg (Calculated) : 75.3  Temp (24hrs), Avg:99.3 F (37.4 C), Min:99.3 F (37.4 C), Max:99.3 F (37.4 C)   Recent Labs Lab 06/04/16 0939  WBC 25.1*  CREATININE 0.79  LATICACIDVEN 1.2    Estimated Creatinine Clearance: 87.2 mL/min (by C-G formula based on SCr of 0.79 mg/dL).    Allergies  Allergen Reactions  . Imdur [Isosorbide Dinitrate]   . Nortriptyline Hcl   . Prazosin Hcl     joint pain  . Quetiapine Fumarate   . Tizanidine     Antimicrobials this admission: 2/6 Zosyn x 1 dose 2/6 Ceftriaxone  >>  2/6 azithromycin  >>   Dose adjustments this admission:  Microbiology results: 2/6 BCx: sent 2/6 UCx: sent   Thank you for allowing pharmacy to be a part of this patient's care.  Dominic Mcmillan, PharmD, BCPS Clinical Pharmacist 06/04/2016 2:50 PM

## 2016-06-05 LAB — CBC
HEMATOCRIT: 32 % — AB (ref 40.0–52.0)
Hemoglobin: 11 g/dL — ABNORMAL LOW (ref 13.0–18.0)
MCH: 33 pg (ref 26.0–34.0)
MCHC: 34.2 g/dL (ref 32.0–36.0)
MCV: 96.3 fL (ref 80.0–100.0)
PLATELETS: 141 10*3/uL — AB (ref 150–440)
RBC: 3.32 MIL/uL — AB (ref 4.40–5.90)
RDW: 12.9 % (ref 11.5–14.5)
WBC: 16 10*3/uL — AB (ref 3.8–10.6)

## 2016-06-05 LAB — BASIC METABOLIC PANEL
Anion gap: 5 (ref 5–15)
BUN: 19 mg/dL (ref 6–20)
CHLORIDE: 99 mmol/L — AB (ref 101–111)
CO2: 26 mmol/L (ref 22–32)
CREATININE: 0.94 mg/dL (ref 0.61–1.24)
Calcium: 8.1 mg/dL — ABNORMAL LOW (ref 8.9–10.3)
GFR calc Af Amer: >60 mL/min (ref >60)
GFR calc non Af Amer: >60 mL/min (ref >60)
Glucose, Bld: 121 mg/dL — ABNORMAL HIGH (ref 65–99)
POTASSIUM: 3.7 mmol/L (ref 3.5–5.1)
SODIUM: 130 mmol/L — AB (ref 135–145)

## 2016-06-05 LAB — PROCALCITONIN: Procalcitonin: 0.79 ng/mL

## 2016-06-05 MED ORDER — SODIUM CHLORIDE 0.9 % IV SOLN
INTRAVENOUS | Status: DC
  Administered 2016-06-05 – 2016-06-06 (×2): via INTRAVENOUS

## 2016-06-05 MED ORDER — GUAIFENESIN-DM 100-10 MG/5ML PO SYRP
5.0000 mL | ORAL_SOLUTION | ORAL | Status: DC | PRN
  Administered 2016-06-05 – 2016-06-07 (×10): 5 mL via ORAL
  Filled 2016-06-05 (×10): qty 5

## 2016-06-05 NOTE — Evaluation (Signed)
Physical Therapy Evaluation Patient Details Name: Dominic Mcmillan MRN: [id number] DOB: August 25, 1945 Today's Date: 06/05/2016   History of Present Illness  pt 71 yo male presented to hospital w/ fever and confusion, diagnosed w/ pnemounia. PMH of; CAD w/ stents, PTSD, HTN, c-spine fracture.  Clinical Impression  Pt alert, awake and responsive to commands [facility]). Pt on 2 L/min of O2 and saturation stayed above 89% throughout session and during ambulation. Pt able to transfer in and out of bed independently w/o difficulties. Pt performed functional activity of toileting and washing hands under PT supervision. He ambulated 200' under PT supervision w/ min swaying to the left during L stance, no increase in work of breathing or LOB. Pt able to increase and decrease gait speed and perform head turns during ambulation w/o difficulty. Overall pt is functioning well and does not display any skilled PT needs at this time and will complete PT orders. Notified nursing staff to continue to ambulate w/ pt.     Follow Up Recommendations No PT follow up    Equipment Recommendations       Recommendations for Other Services       Precautions / Restrictions Precautions Precautions: Fall Restrictions Weight Bearing Restrictions: No      Mobility  Bed Mobility Overal bed mobility: Independent                Transfers Overall transfer level: Independent                  Ambulation/Gait Ambulation/Gait assistance: Supervision Ambulation Distance (Feet): 250 Feet Assistive device: None Gait Pattern/deviations: Step-through pattern   Gait velocity interpretation: at or above normal speed for age/gender General Gait Details: min swaying to left during stance on L, good step length, able to increase and decrease speed on command  Stairs            Wheelchair Mobility    Modified Rankin (Stroke Patients Only)       Balance Overall balance assessment: Independent                                            Pertinent Vitals/Pain Pain Assessment: No/denies pain    Home Living Family/patient expects to be discharged to:: Private residence Living Arrangements: Spouse/significant other Available Help at Discharge: Family;Available 24 hours/day Type of Home: House Home Access: Stairs to enter Entrance Stairs-Rails: None Entrance Stairs-Number of Steps: 4 Home Layout: One level        Prior Function Level of Independence: Independent         Comments: very active, performs routine yard work and house work     Higher education careers adviser   Dominant Hand: Right    Extremity/Trunk Assessment   Upper Extremity Assessment Upper Extremity Assessment: Overall WFL for tasks assessed    Lower Extremity Assessment Lower Extremity Assessment: Overall WFL for tasks assessed       Communication   Communication: HOH  Cognition Arousal/Alertness: Awake/alert Behavior During Therapy: WFL for tasks assessed/performed Overall Cognitive Status: Within Functional Limits for tasks assessed                      General Comments      Exercises     Assessment/Plan    PT Assessment Patent does not need any further PT services  PT Problem List  PT Treatment Interventions      PT Goals (Current goals can be found in the Care Plan section)  Acute Rehab PT Goals Patient Stated Goal: Return home PT Goal Formulation: With patient/family Time For Goal Achievement: 06/19/16 Potential to Achieve Goals: Good    Frequency     Barriers to discharge        Co-evaluation               End of Session Equipment Utilized During Treatment: Gait belt Activity Tolerance: Patient tolerated treatment well Patient left: in bed;with bed alarm set;with call bell/phone within reach;with nursing/sitter in room;with family/visitor present Nurse Communication: Mobility status         Time: [id number] PT Time Calculation (min)  (ACUTE ONLY): 14 min   Charges:         PT G Codes:        [employer] Student PT 06/05/2016, 12:15 PM

## 2016-06-05 NOTE — Progress Notes (Addendum)
[facility] [place] [facility]   PATIENT NAME: Dominic CoombeKenneth Mcmillan    MR#:  [id number]  DATE OF BIRTH:  12/20/1945  SUBJECTIVE:  CHIEF COMPLAINT:   Chief Complaint  Patient presents with  . Altered Mental Status   Better shortness of breath and cough, on O2 [place] 2L. REVIEW OF SYSTEMS:  Review of Systems  Constitutional: Positive for fever and malaise/fatigue. Negative for chills.  HENT: Negative for congestion.   Eyes: Negative for blurred vision and double vision.  Respiratory: Positive for cough and shortness of breath. Negative for hemoptysis, sputum production, wheezing and stridor.   Cardiovascular: Negative for chest pain and leg swelling.  Gastrointestinal: Negative for abdominal pain, blood in stool, diarrhea, melena, nausea and vomiting.  Genitourinary: Negative for dysuria and hematuria.  Musculoskeletal: Negative for back pain.  Skin: Negative for itching and rash.  Neurological: Positive for weakness. Negative for dizziness, focal weakness and loss of consciousness.  Psychiatric/Behavioral: Negative for depression. The patient is not nervous/anxious.     DRUG ALLERGIES:   Allergies  Allergen Reactions  . Imdur [Isosorbide Dinitrate]   . Nortriptyline Hcl   . Prazosin Hcl     joint pain  . Quetiapine Fumarate   . Tizanidine    VITALS:  Blood pressure (!) 121/59, pulse 75, temperature (!) 100.7 F (38.2 C), temperature source Oral, resp. rate (!) 22, height 5\' 11"  (1.803 m), weight 163 lb (73.9 kg), SpO2 97 %. PHYSICAL EXAMINATION:  Physical Exam  Constitutional: He is oriented to person, place, and time and well-developed, well-nourished, and in no distress.  HENT:  Head: Normocephalic.  Mouth/Throat: Oropharynx is clear and moist.  Eyes: Conjunctivae and EOM are normal.  Neck: Normal range of motion. Neck supple. No JVD present. No tracheal deviation present.  Cardiovascular: Normal rate, regular rhythm and normal heart sounds.   Exam reveals no gallop.   No murmur heard. Pulmonary/Chest: Effort normal and breath sounds normal. No respiratory distress. He has no wheezes. He has no rales.  Abdominal: Soft. Bowel sounds are normal. He exhibits no distension. There is no tenderness.  Musculoskeletal: Normal range of motion. He exhibits no edema or tenderness.  Neurological: He is alert and oriented to person, place, and time. No cranial nerve deficit.  Skin: No rash noted. No erythema.  Psychiatric: Affect normal.   LABORATORY PANEL:   CBC  Recent Labs Lab 06/05/16 0545  WBC 16.0*  HGB 11.0*  HCT 32.0*  PLT 141*   ------------------------------------------------------------------------------------------------------------------ Chemistries   Recent Labs Lab 06/04/16 0939 06/05/16 0545  NA 135 130*  K 3.8 3.7  CL 101 99*  CO2 26 26  GLUCOSE 134* 121*  BUN 20 19  CREATININE 0.79 0.94  CALCIUM 8.6* 8.1*  AST 25  --   ALT 13*  --   ALKPHOS 53  --   BILITOT 0.8  --    RADIOLOGY:  No results found. ASSESSMENT AND PLAN:   Dominic Mcmillan  is a 71 y.o. male with a known history of  CAD s/p stents, glaucoma, PTSD, HTN, c-spine surgery brought in from home due to fevers and worsening confusion.  #1 sepsis-secondary to multilobar pneumonia. Chest x-ray with bibasilar pneumonia noted. -Influenza PCR is Negative. discontinue Tamiflu.  -Blood cultures pending. Started on Rocephin and azithromycin for community-acquired pneumonia. Try to wean off Oxygen. Patient not on home oxygen. - Hyponatremia, continue  NS IV fluids  #2 metabolic encephalopathy-likely from underlying sepsis.  Improved.  #3 CAD-stable at this  time. Continue aspirin, Plavix, statin and ARB  #4 glaucoma-continue eyedrops  #5 hypertension-continue his home medications. Patient on Benicar-substituted to Avapro in the hospital, hold due to low side BP.  All the records are reviewed and case discussed with Care  Management/Social Worker. Management plans discussed with the patient, his wife and son and they are in agreement.  CODE STATUS: Full code  TOTAL TIME TAKING CARE OF THIS PATIENT: 47 minutes.   More than 50% of the time was spent in counseling/coordination of care: YES  POSSIBLE D/C IN 2 DAYS, DEPENDING ON CLINICAL CONDITION.   Shaune Pollack M.D on 06/05/2016 at 3:19 PM  Between 7am to 6pm - Pager - [phone]  After 6pm go to [email or web address] - Social research officer, government  [facility] [place] [facility]  Office  [phone]  CC: Primary care physician; Bari Edward, MD  Note: This dictation was prepared with Dragon dictation along with smaller phrase technology. Any transcriptional errors that result from this process are unintentional.

## 2016-06-06 LAB — CBC
HCT: 33.1 % — ABNORMAL LOW (ref 40.0–52.0)
Hemoglobin: 11.4 g/dL — ABNORMAL LOW (ref 13.0–18.0)
MCH: 33.1 pg (ref 26.0–34.0)
MCHC: 34.5 g/dL (ref 32.0–36.0)
MCV: 95.9 fL (ref 80.0–100.0)
PLATELETS: 182 10*3/uL (ref 150–440)
RBC: 3.45 MIL/uL — ABNORMAL LOW (ref 4.40–5.90)
RDW: 13.5 % (ref 11.5–14.5)
WBC: 14.6 10*3/uL — ABNORMAL HIGH (ref 3.8–10.6)

## 2016-06-06 LAB — BASIC METABOLIC PANEL
Anion gap: 4 — ABNORMAL LOW (ref 5–15)
BUN: 10 mg/dL (ref 6–20)
CALCIUM: 8.4 mg/dL — AB (ref 8.9–10.3)
CHLORIDE: 109 mmol/L (ref 101–111)
CO2: 27 mmol/L (ref 22–32)
CREATININE: 0.55 mg/dL — AB (ref 0.61–1.24)
GFR calc Af Amer: >60 mL/min (ref >60)
GFR calc non Af Amer: >60 mL/min (ref >60)
GLUCOSE: 109 mg/dL — AB (ref 65–99)
Potassium: 3.9 mmol/L (ref 3.5–5.1)
Sodium: 140 mmol/L (ref 135–145)

## 2016-06-06 LAB — URINE CULTURE: CULTURE: NO GROWTH

## 2016-06-06 MED ORDER — IRBESARTAN 75 MG PO TABS
75.0000 mg | ORAL_TABLET | Freq: Every day | ORAL | Status: DC
  Administered 2016-06-07: 75 mg via ORAL
  Filled 2016-06-06: qty 1

## 2016-06-06 MED ORDER — AZITHROMYCIN 250 MG PO TABS
500.0000 mg | ORAL_TABLET | Freq: Every day | ORAL | Status: DC
  Administered 2016-06-06 – 2016-06-07 (×2): 500 mg via ORAL
  Filled 2016-06-06 (×2): qty 2

## 2016-06-06 NOTE — Progress Notes (Addendum)
[facility] [place] at [facility]   PATIENT NAME: Dominic CoombeKenneth Hedding    MR#:  [id number]  DATE OF BIRTH:  01/04/1946  SUBJECTIVE:  CHIEF COMPLAINT:   Chief Complaint  Patient presents with  . Altered Mental Status   Better shortness of breath and cough, on O2 [place] 1L. Desats to 80's percent on exertion. REVIEW OF SYSTEMS:  Review of Systems  Constitutional: Positive for malaise/fatigue. Negative for chills and fever.  HENT: Negative for congestion.   Eyes: Negative for blurred vision and double vision.  Respiratory: Positive for cough and shortness of breath. Negative for hemoptysis, sputum production, wheezing and stridor.   Cardiovascular: Negative for chest pain and leg swelling.  Gastrointestinal: Negative for abdominal pain, blood in stool, diarrhea, melena, nausea and vomiting.  Genitourinary: Negative for dysuria and hematuria.  Musculoskeletal: Negative for back pain.  Skin: Negative for itching and rash.  Neurological: Negative for dizziness, focal weakness, loss of consciousness and weakness.  Psychiatric/Behavioral: Negative for depression. The patient is not nervous/anxious.     DRUG ALLERGIES:   Allergies  Allergen Reactions  . Imdur [Isosorbide Dinitrate]   . Nortriptyline Hcl   . Prazosin Hcl     joint pain  . Quetiapine Fumarate   . Tizanidine    VITALS:  Blood pressure (!) 146/74, pulse 79, temperature 99 F (37.2 C), temperature source Oral, resp. rate 18, height 5\' 11"  (1.803 m), weight 163 lb (73.9 kg), SpO2 98 %. PHYSICAL EXAMINATION:  Physical Exam  Constitutional: He is oriented to person, place, and time and well-developed, well-nourished, and in no distress.  HENT:  Head: Normocephalic.  Mouth/Throat: Oropharynx is clear and moist.  Eyes: Conjunctivae and EOM are normal.  Neck: Normal range of motion. Neck supple. No JVD present. No tracheal deviation present.  Cardiovascular: Normal rate, regular rhythm and normal heart  sounds.  Exam reveals no gallop.   No murmur heard. Pulmonary/Chest: Effort normal. No respiratory distress. He has no wheezes. He has rales.  Mild left basilar crackles  Abdominal: Soft. Bowel sounds are normal. He exhibits no distension. There is no tenderness.  Musculoskeletal: Normal range of motion. He exhibits no edema or tenderness.  Neurological: He is alert and oriented to person, place, and time. No cranial nerve deficit.  Skin: No rash noted. No erythema.  Psychiatric: Affect normal.   LABORATORY PANEL:   CBC  Recent Labs Lab 06/06/16 0443  WBC 14.6*  HGB 11.4*  HCT 33.1*  PLT 182   ------------------------------------------------------------------------------------------------------------------ Chemistries   Recent Labs Lab 06/04/16 0939  06/06/16 0443  NA 135  < > 140  K 3.8  < > 3.9  CL 101  < > 109  CO2 26  < > 27  GLUCOSE 134*  < > 109*  BUN 20  < > 10  CREATININE 0.79  < > 0.55*  CALCIUM 8.6*  < > 8.4*  AST 25  --   --   ALT 13*  --   --   ALKPHOS 53  --   --   BILITOT 0.8  --   --   < > = values in this interval not displayed. RADIOLOGY:  No results found. ASSESSMENT AND PLAN:   Dominic CoombeKenneth Behanna  is a 71 y.o. male with a known history of  CAD s/p stents, glaucoma, PTSD, HTN, c-spine surgery brought in from home due to fevers and worsening confusion.  #1 sepsis-secondary to multilobar pneumonia. Chest x-ray with bibasilar pneumonia noted. -Influenza PCR  is Negative. discontinued Tamiflu.  -Blood cultures negative so far. Started on Rocephin and azithromycin for community-acquired pneumonia. Discontinue Rocephin and continue Zithromax.  Acute respiratory failure with hypoxia due to pneumonia  Try to wean off Oxygen. Patient not on home oxygen.  Hyponatremia, improved with  NS IV fluids  #2 metabolic encephalopathy-likely from underlying sepsis.  Improved.  #3 CAD-stable at this time. Continue aspirin, Plavix, statin and ARB  #4  glaucoma-continue eyedrops  #5 hypertension-continue his home medications. Patient on Benicar-substituted to Avapro in the hospital, hold due to low side BP. BP is elevated, resume.  All the records are reviewed and case discussed with Care Management/Social Worker. Management plans discussed with the patient, his wife and they are in agreement.  CODE STATUS: Full code  TOTAL TIME TAKING CARE OF THIS PATIENT: 37 minutes.   More than 50% of the time was spent in counseling/coordination of care: YES  POSSIBLE D/C IN 2 DAYS, DEPENDING ON CLINICAL CONDITION.   Shaune Pollack M.D on 06/06/2016 at 1:40 PM  Between 7am to 6pm - Pager - [phone]  After 6pm go to [email or web address] - Social research officer, government  [facility] [place] [facility]  Office  [phone]  CC: Primary care physician; Bari Edward, MD  Note: This dictation was prepared with Dragon dictation along with smaller phrase technology. Any transcriptional errors that result from this process are unintentional.

## 2016-06-07 LAB — CBC
HCT: 32.2 % — ABNORMAL LOW (ref 40.0–52.0)
Hemoglobin: 11.4 g/dL — ABNORMAL LOW (ref 13.0–18.0)
MCH: 33.6 pg (ref 26.0–34.0)
MCHC: 35.2 g/dL (ref 32.0–36.0)
MCV: 95.5 fL (ref 80.0–100.0)
PLATELETS: 190 10*3/uL (ref 150–440)
RBC: 3.38 MIL/uL — ABNORMAL LOW (ref 4.40–5.90)
RDW: 13.5 % (ref 11.5–14.5)
WBC: 13.4 10*3/uL — ABNORMAL HIGH (ref 3.8–10.6)

## 2016-06-07 LAB — PROCALCITONIN: Procalcitonin: 0.31 ng/mL

## 2016-06-07 MED ORDER — AZITHROMYCIN 500 MG PO TABS: 500.0000 mg | ORAL_TABLET | Freq: Every day | ORAL | 0 refills | Status: DC

## 2016-06-07 MED ORDER — GUAIFENESIN-DM 100-10 MG/5ML PO SYRP: 5.0000 mL | ORAL_SOLUTION | ORAL | 0 refills | Status: DC | PRN

## 2016-06-07 MED ORDER — LEVOFLOXACIN 750 MG PO TABS: 750.0000 mg | ORAL_TABLET | Freq: Every day | ORAL | 0 refills | Status: DC

## 2016-06-07 NOTE — Care Management Important Message (Signed)
Important Message  Patient Details  Name: Dominic Mcmillan MRN: [id number] Date of Birth: 07/05/1945   Medicare Important Message Given:  Yes    Chapman FitchBOWEN, Sharry Beining T, RN 06/07/2016, 11:32 AM

## 2016-06-07 NOTE — Discharge Summary (Signed)
[facility] [place] at [facility]   PATIENT NAME: Dominic CoombeKenneth Mcmillan    MR#:  [id number]  DATE OF BIRTH:  02/01/1946  DATE OF ADMISSION:  06/04/2016   ADMITTING PHYSICIAN: Enid Baasadhika Kalisetti, MD  DATE OF DISCHARGE: 06/07/2016  1:17 PM  PRIMARY CARE PHYSICIAN: Bari EdwardLaura Berglund, MD   ADMISSION DIAGNOSIS:  Confusion [R41.0] Community acquired pneumonia, unspecified laterality [J18.9] DISCHARGE DIAGNOSIS:  Active Problems:   Sepsis (HCC) sepsis-secondary to multilobar pneumonia. Acute respiratory failure with hypoxia due to pneumonia  SECONDARY DIAGNOSIS:   Past Medical History:  Diagnosis Date  . Acute MI   . CAD (coronary artery disease)   . Glaucoma   . HLD (hyperlipidemia)   . HTN (hypertension)   . PTSD (post-traumatic stress disorder)    HOSPITAL COURSE:  Archie EndoKenneth Biancaniellois a 71 y.o. malewith a known history of CAD s/p stents, glaucoma, PTSD, HTN, c-spine surgery brought in from home due to fevers and worsening confusion.  #1 sepsis-secondary to multilobar pneumonia. Chest x-ray with bibasilar pneumonia noted. -Influenza PCR is Negative. discontinued Tamiflu.  -Blood cultures negative so far. Started on Rocephin and azithromycin for community-acquired pneumonia. Discontinued Rocephin and Zithromax. Change to by mouth Levaquin for 3 more days.  Acute respiratory failure with hypoxia due to pneumonia  Weaned off Oxygen. Patient not on home oxygen.  Hyponatremia, improved with  NS IV fluids  #2 metabolic encephalopathy-likely from underlying sepsis.  Improved.  #3 CAD-stable at this time. Continue aspirin, Plavix, statin and ARB  #4 glaucoma-continue eyedrops  #5 hypertension-continue his home medications. Patient on Benicar-substituted to Avapro in the hospital, hold due to low side BP. BP is elevated, resumed.  DISCHARGE CONDITIONS:  Stable, discharged to home today. CONSULTS OBTAINED:   DRUG ALLERGIES:   Allergies    Allergen Reactions  . Imdur [Isosorbide Dinitrate]   . Nortriptyline Hcl   . Prazosin Hcl     joint pain  . Quetiapine Fumarate   . Tizanidine    DISCHARGE MEDICATIONS:   Allergies as of 06/07/2016      Reactions   Imdur [isosorbide Dinitrate]    Nortriptyline Hcl    Prazosin Hcl    joint pain   Quetiapine Fumarate    Tizanidine       Medication List    STOP taking these medications   HYDROcodone-homatropine 5-1.5 MG/5ML syrup Commonly known as:  HYCODAN   oseltamivir 75 MG capsule Commonly known as:  TAMIFLU     TAKE these medications   BENICAR 20 MG tablet Generic drug:  olmesartan Take 1 tablet by mouth daily.   brimonidine 0.2 % ophthalmic solution Commonly known as:  ALPHAGAN Place 1 drop into the left eye 2 (two) times daily.   CENTRUM SILVER tablet Take 1 tablet by mouth daily.   co-enzyme Q-10 30 MG capsule Take 30 mg by mouth 3 (three) times daily.   DORZOLAMIDE HCL-TIMOLOL MAL OP Place 1 drop into the left eye 2 (two) times daily.   ECOTRIN LOW STRENGTH 81 MG EC tablet Generic drug:  aspirin Take 1 tablet by mouth daily.   guaiFENesin-dextromethorphan 100-10 MG/5ML syrup Commonly known as:  ROBITUSSIN DM Take 5 mLs by mouth every 4 (four) hours as needed for cough (chest congestion).   HM VITAMIN D3 2000 units Caps Generic drug:  Cholecalciferol Take 1 capsule by mouth daily.   HYDROcodone-acetaminophen 5-325 MG tablet Commonly known as:  NORCO/VICODIN Take 1 tablet by mouth every 6 (six) hours as needed for moderate pain.  isosorbide mononitrate 30 MG 24 hr tablet Commonly known as:  IMDUR Take 1 tablet (30 mg total) by mouth daily.   ketoconazole 2 % shampoo Commonly known as:  NIZORAL KETOCONAZOLE, 2% (External Shampoo) - Historical Medication  (2 %) Active   latanoprost 0.005 % ophthalmic solution Commonly known as:  XALATAN Place 1 drop into both eyes at bedtime.   levofloxacin 750 MG tablet Commonly known as:   LEVAQUIN Take 1 tablet (750 mg total) by mouth daily.   LORazepam 1 MG tablet Commonly known as:  ATIVAN Take 1 mg by mouth every 8 (eight) hours as needed for anxiety (PTSD).   nitroGLYCERIN 0.4 MG SL tablet Commonly known as:  NITROSTAT Place under the tongue.   ondansetron 4 MG disintegrating tablet Commonly known as:  ZOFRAN ODT Take 1 tablet (4 mg total) by mouth every 8 (eight) hours as needed for nausea or vomiting.   PLAVIX 75 MG tablet Generic drug:  clopidogrel Take 1 tablet by mouth daily.   rosuvastatin 40 MG tablet Commonly known as:  CRESTOR Take 20 mg by mouth daily.   vitamin C 500 MG tablet Commonly known as:  ASCORBIC ACID Take 1,000 mg by mouth daily.        DISCHARGE INSTRUCTIONS:  See AVS.  If you experience worsening of your admission symptoms, develop shortness of breath, life threatening emergency, suicidal or homicidal thoughts you must seek medical attention immediately by calling 911 or calling your MD immediately  if symptoms less severe.  You Must read complete instructions/literature along with all the possible adverse reactions/side effects for all the Medicines you take and that have been prescribed to you. Take any new Medicines after you have completely understood and accpet all the possible adverse reactions/side effects.   Please note  You were cared for by a hospitalist during your hospital stay. If you have any questions about your discharge medications or the care you received while you were in the hospital after you are discharged, you can call the unit and asked to speak with the hospitalist on call if the hospitalist that took care of you is not available. Once you are discharged, your primary care physician will handle any further medical issues. Please note that NO REFILLS for any discharge medications will be authorized once you are discharged, as it is imperative that you return to your primary care physician (or establish a  relationship with a primary care physician if you do not have one) for your aftercare needs so that they can reassess your need for medications and monitor your lab values.    On the day of Discharge:  VITAL SIGNS:  Blood pressure 138/76, pulse 70, temperature 98 F (36.7 C), temperature source Oral, resp. rate 18, height 5\' 11"  (1.803 m), weight 163 lb (73.9 kg), SpO2 94 %. PHYSICAL EXAMINATION:  GENERAL:  71 y.o.-year-old patient lying in the bed with no acute distress.  EYES: Pupils equal, round, reactive to light and accommodation. No scleral icterus. Extraocular muscles intact.  HEENT: Head atraumatic, normocephalic. Oropharynx and nasopharynx clear.  NECK:  Supple, no jugular venous distention. No thyroid enlargement, no tenderness.  LUNGS: Normal breath sounds bilaterally, no wheezing, rales,rhonchi or crepitation. No use of accessory muscles of respiration.  CARDIOVASCULAR: S1, S2 normal. No murmurs, rubs, or gallops.  ABDOMEN: Soft, non-tender, non-distended. Bowel sounds present. No organomegaly or mass.  EXTREMITIES: No pedal edema, cyanosis, or clubbing.  NEUROLOGIC: Cranial nerves II through XII are intact. Muscle strength 5/5 in  all extremities. Sensation intact. Gait not checked.  PSYCHIATRIC: The patient is alert and oriented x 3.  SKIN: No obvious rash, lesion, or ulcer.  DATA REVIEW:   CBC  Recent Labs Lab 06/07/16 0457  WBC 13.4*  HGB 11.4*  HCT 32.2*  PLT 190    Chemistries   Recent Labs Lab 06/04/16 0939  06/06/16 0443  NA 135  < > 140  K 3.8  < > 3.9  CL 101  < > 109  CO2 26  < > 27  GLUCOSE 134*  < > 109*  BUN 20  < > 10  CREATININE 0.79  < > 0.55*  CALCIUM 8.6*  < > 8.4*  AST 25  --   --   ALT 13*  --   --   ALKPHOS 53  --   --   BILITOT 0.8  --   --   < > = values in this interval not displayed.   Microbiology Results  Results for orders placed or performed during the hospital encounter of 06/04/16  Blood Culture (routine x 2)      Status: None (Preliminary result)   Collection Time: 06/04/16  9:39 AM  Result Value Ref Range Status   Specimen Description BLOOD L AC  Final   Special Requests BOTTLES DRAWN AEROBIC AND ANAEROBIC BCAV  Final   Culture NO GROWTH 3 DAYS  Final   Report Status PENDING  Incomplete  Blood Culture (routine x 2)     Status: None (Preliminary result)   Collection Time: 06/04/16  9:39 AM  Result Value Ref Range Status   Specimen Description BLOOD L HAND  Final   Special Requests BOTTLES DRAWN AEROBIC AND ANAEROBIC BCAV  Final   Culture NO GROWTH 3 DAYS  Final   Report Status PENDING  Incomplete  Urine culture     Status: None   Collection Time: 06/04/16  9:39 AM  Result Value Ref Range Status   Specimen Description URINE, RANDOM  Final   Special Requests NONE  Final   Culture   Final    NO GROWTH Performed at [facility], [address], [place], [place] [address]    Report Status 06/06/2016 FINAL  Final    RADIOLOGY:  No results found.   Management plans discussed with the patient, His wife and they are in agreement.  CODE STATUS:  Code Status History    Date Active Date Inactive Code Status Order ID Comments User Context   06/04/2016  2:33 PM 06/07/2016  4:22 PM Full Code [id number]  Enid Baas, MD Inpatient   09/12/2015  1:34 AM 09/12/2015 10:48 PM Full Code [id number]  Oralia Manis, MD Inpatient      TOTAL TIME TAKING CARE OF THIS PATIENT: 35 minutes.    Shaune Pollack M.D on 06/07/2016 at 5:10 PM  Between 7am to 6pm - Pager - [phone]  After 6pm go to [email or web address] - Social research officer, government  [facility] [place] [facility]  Office  [phone]  CC: Primary care physician; Bari Edward, MD   Note: This dictation was prepared with Dragon dictation along with smaller phrase technology. Any transcriptional errors that result from this process are unintentional.

## 2016-06-07 NOTE — Progress Notes (Signed)
Patient discharged to home as ordered. Patient given prescription for levaquin instead a Azithromycin. IV discontinued, site clean dry and intact. Patient states he is ready to go had a large BM today. Wife at the bedside to take patient home. Discharge instructions given and no acute distress noted.

## 2016-06-07 NOTE — Discharge Instructions (Signed)
Heart healthy diet

## 2016-06-09 LAB — CULTURE, BLOOD (ROUTINE X 2)
CULTURE: NO GROWTH
CULTURE: NO GROWTH

## 2016-06-21 ENCOUNTER — Ambulatory Visit (INDEPENDENT_AMBULATORY_CARE_PROVIDER_SITE_OTHER): Payer: Medicare PPO | Admitting: Internal Medicine

## 2016-06-21 VITALS — BP 136/78 | HR 62 | Temp 97.7°F | Ht 71.0 in | Wt 156.6 lb

## 2016-06-21 DIAGNOSIS — J189 Pneumonia, unspecified organism: Secondary | ICD-10-CM

## 2016-06-21 NOTE — Progress Notes (Signed)
Date:  06/21/2016   Name:  Dominic Mcmillan   DOB:  1946-04-02   MRN:  [id number]   Chief Complaint: Hospitalization Follow-up (Symptoms for pnemonia have gotten better. Still having difficulty getting appetite back.) Admitted to [facility] with pneumonia 06/04/16 to 06/07/16. Treated with Rocephin and Zithromax and discharged on Levaquin.  He did not require O2 at home.  He has been home for 2 weeks and is doing much better.  His appetite is improving, he is walking for exercise and has no wheezing with only mild SOB.  Review of Systems  Constitutional: Positive for appetite change. Negative for fatigue and fever.  Eyes: Negative for visual disturbance.  Respiratory: Negative for chest tightness, shortness of breath and wheezing.   Cardiovascular: Negative for chest pain and palpitations.  Gastrointestinal: Negative for abdominal pain, constipation and diarrhea.  Genitourinary: Negative for dysuria.  Musculoskeletal: Negative for arthralgias.  Skin: Negative for color change, rash and wound.  Neurological: Negative for dizziness, light-headedness and headaches.    Patient Active Problem List   Diagnosis Date Noted  . Sepsis (HCC) 06/04/2016  . Angina pectoris (HCC) 09/12/2015  . Thrombocytopenia (HCC) 09/12/2015  . Hyperglycemia 09/12/2015  . Otitis media 03/28/2015  . Familial multiple lipoprotein-type hyperlipidemia 02/13/2015  . Hearing loss 02/13/2015  . Neurosis, posttraumatic 02/13/2015  . Arteriosclerosis of coronary artery 02/13/2015  . Degeneration of intervertebral disc of lumbar region 02/13/2015  . Essential (primary) hypertension 02/13/2015  . Discoloration of nail 02/13/2015  . GERD (gastroesophageal reflux disease) 03/14/2014  . History of cardiac catheterization 03/11/2014    Prior to Admission medications   Medication Sig Start Date End Date Taking? Authorizing Provider  aspirin (ECOTRIN LOW STRENGTH) 81 MG EC tablet Take 1 tablet by mouth daily.   Yes  Historical Provider, MD  brimonidine (ALPHAGAN) 0.2 % ophthalmic solution Place 1 drop into the left eye 2 (two) times daily.    Yes Historical Provider, MD  Cholecalciferol (HM VITAMIN D3) 2000 units CAPS Take 1 capsule by mouth daily.    Yes Historical Provider, MD  clopidogrel (PLAVIX) 75 MG tablet Take 1 tablet by mouth daily.   Yes Historical Provider, MD  co-enzyme Q-10 30 MG capsule Take 30 mg by mouth 3 (three) times daily.   Yes Historical Provider, MD  DORZOLAMIDE HCL-TIMOLOL MAL OP Place 1 drop into the left eye 2 (two) times daily.    Yes Historical Provider, MD  HYDROcodone-acetaminophen (NORCO/VICODIN) 5-325 MG tablet Take 1 tablet by mouth every 6 (six) hours as needed for moderate pain.   Yes Historical Provider, MD  isosorbide mononitrate (IMDUR) 30 MG 24 hr tablet Take 1 tablet (30 mg total) by mouth daily. 09/12/15  Yes Katharina Caper, MD  ketoconazole (NIZORAL) 2 % shampoo KETOCONAZOLE, 2% (External Shampoo) - Historical Medication  (2 %) Active   Yes Historical Provider, MD  latanoprost (XALATAN) 0.005 % ophthalmic solution Place 1 drop into both eyes at bedtime.    Yes Historical Provider, MD  LORazepam (ATIVAN) 1 MG tablet Take 1 mg by mouth every 8 (eight) hours as needed for anxiety (PTSD).   Yes Historical Provider, MD  Multiple Vitamins-Minerals (CENTRUM SILVER) tablet Take 1 tablet by mouth daily.    Yes Historical Provider, MD  nitroGLYCERIN (NITROSTAT) 0.4 MG SL tablet Place under the tongue.   Yes Historical Provider, MD  olmesartan (BENICAR) 20 MG tablet Take 1 tablet by mouth daily.   Yes Historical Provider, MD  rosuvastatin (CRESTOR) 40 MG tablet Take  20 mg by mouth daily.   Yes Historical Provider, MD  vitamin C (ASCORBIC ACID) 500 MG tablet Take 1,000 mg by mouth daily.    Yes Historical Provider, MD  guaiFENesin-dextromethorphan (ROBITUSSIN DM) 100-10 MG/5ML syrup Take 5 mLs by mouth every 4 (four) hours as needed for cough (chest congestion). Patient not  taking: Reported on 06/21/2016 06/07/16   Shaune PollackQing Chen, MD       Shaune PollackQing Chen, MD  ondansetron [facility](ZOFRAN ODT) 4 MG disintegrating tablet Take 1 tablet (4 mg total) by mouth every 8 (eight) hours as needed for nausea or vomiting. Patient not taking: Reported on 06/04/2016 06/03/16   Reubin MilanLaura H Prescilla Monger, MD    Allergies  Allergen Reactions  . Imdur [Isosorbide Dinitrate]   . Nortriptyline Hcl   . Prazosin Hcl     joint pain  . Quetiapine Fumarate   . Tizanidine     Past Surgical History:  Procedure Laterality Date  . CARDIAC CATHETERIZATION N/A 09/12/2015   Procedure: Left Heart Cath and Coronary Angiography;  Surgeon: Lamar BlinksBruce J Kowalski, MD;  Location: [facility] INVASIVE CV LAB;  Service: Cardiovascular;  Laterality: N/A;  . CERVICAL SPINE SURGERY    . COLONOSCOPY  2008  . CORONARY ANGIOPLASTY WITH STENT PLACEMENT    . CORONARY ANGIOPLASTY WITH STENT PLACEMENT    . NASAL SINUS SURGERY    . SALIVARY GLAND SURGERY Right 2011   oncocytoma  . TONSILLECTOMY      Social History  Substance Use Topics  . Smoking status: Never Smoker  . Smokeless tobacco: Never Used  . Alcohol use No     Medication list has been reviewed and updated.   Physical Exam  Constitutional: He appears well-developed and well-nourished.  Neck: Normal range of motion. Neck supple. Carotid bruit is not present.  Cardiovascular: Normal rate, regular rhythm and normal heart sounds.   Pulmonary/Chest: Effort normal. He has decreased breath sounds. He has no wheezes. He has no rhonchi. He exhibits no tenderness.  Musculoskeletal: Normal range of motion.  Neurological: He is alert.  Skin: Skin is warm and dry.  Psychiatric: He has a normal mood and affect.  Nursing note and vitals reviewed.   BP 136/78   Pulse 62   Temp 97.7 F (36.5 C)   Ht 5\' 11"  (1.803 m)   Wt 156 lb 9.6 oz (71 kg)   SpO2 95%   BMI 21.84 kg/m   Assessment and Plan: 1. Community acquired pneumonia, unspecified laterality Resolving - continue healthy  diet, fluids and advance activity as tolerated F/U in 3 week for CBC and CXR   Bari EdwardLaura Gisell Buehrle, MD [facility]  06/21/2016

## 2016-07-12 ENCOUNTER — Encounter: Payer: Self-pay | Admitting: Internal Medicine

## 2016-07-12 ENCOUNTER — Ambulatory Visit (INDEPENDENT_AMBULATORY_CARE_PROVIDER_SITE_OTHER): Payer: Medicare PPO | Admitting: Internal Medicine

## 2016-07-12 ENCOUNTER — Ambulatory Visit
Admission: RE | Admit: 2016-07-12 | Discharge: 2016-07-12 | Disposition: A | Discharge status: Home / Self Care | Payer: Medicare PPO | Source: Ambulatory Visit | Attending: Internal Medicine | Admitting: Internal Medicine

## 2016-07-12 VITALS — BP 120/78 | HR 60 | Ht 71.0 in | Wt 160.0 lb

## 2016-07-12 DIAGNOSIS — J189 Pneumonia, unspecified organism: Secondary | ICD-10-CM

## 2016-07-12 DIAGNOSIS — I209 Angina pectoris, unspecified: Secondary | ICD-10-CM | POA: Diagnosis not present

## 2016-07-12 NOTE — Progress Notes (Signed)
Date:  07/12/2016   Name:  Dominic LorenzoKenneth E Esquivel   DOB:  10/01/1945   MRN:  [id number]   Chief Complaint: Pneumonia (follow up/ sepsis) Pneumonia  There is no chest tightness, cough, difficulty breathing, shortness of breath or wheezing. The problem has been resolved. Associated symptoms include rhinorrhea. Pertinent negatives include no appetite change, chest pain, fever, orthopnea or PND. He reports significant (feels much better ~ 70% of normal) improvement on treatment.  He is improving steadily.  Appetite is back and weight is up.  He is walking and gaining strength.  Mild cough but no  Sputum.  CAD - had demand ischemia on admission.  No further anginal pain.  Does have some chest wall muscular pain related to cough.     Review of Systems  Constitutional: Negative for appetite change, fatigue, fever and unexpected weight change.  HENT: Positive for congestion and rhinorrhea.   Eyes: Negative for visual disturbance.  Respiratory: Negative for cough, shortness of breath and wheezing.   Cardiovascular: Negative for chest pain, palpitations, leg swelling and PND.    Patient Active Problem List   Diagnosis Date Noted  . Sepsis (HCC) 06/04/2016  . Angina pectoris (HCC) 09/12/2015  . Thrombocytopenia (HCC) 09/12/2015  . Hyperglycemia 09/12/2015  . Otitis media 03/28/2015  . Familial multiple lipoprotein-type hyperlipidemia 02/13/2015  . Hearing loss 02/13/2015  . Neurosis, posttraumatic 02/13/2015  . Arteriosclerosis of coronary artery 02/13/2015  . Degeneration of intervertebral disc of lumbar region 02/13/2015  . Essential (primary) hypertension 02/13/2015  . Discoloration of nail 02/13/2015  . GERD (gastroesophageal reflux disease) 03/14/2014  . History of cardiac catheterization 03/11/2014    Prior to Admission medications   Medication Sig Start Date End Date Taking? Authorizing Provider  aspirin (ECOTRIN LOW STRENGTH) 81 MG EC tablet Take 1 tablet by mouth daily.   Yes  Historical Provider, MD  brimonidine (ALPHAGAN) 0.2 % ophthalmic solution Place 1 drop into the left eye 2 (two) times daily.    Yes Historical Provider, MD  Cholecalciferol (HM VITAMIN D3) 2000 units CAPS Take 1 capsule by mouth daily.    Yes Historical Provider, MD  clopidogrel (PLAVIX) 75 MG tablet Take 1 tablet by mouth daily.   Yes Historical Provider, MD  co-enzyme Q-10 30 MG capsule Take 30 mg by mouth 3 (three) times daily.   Yes Historical Provider, MD  DORZOLAMIDE HCL-TIMOLOL MAL OP Place 1 drop into the left eye 2 (two) times daily.    Yes Historical Provider, MD  guaiFENesin-dextromethorphan (ROBITUSSIN DM) 100-10 MG/5ML syrup Take 5 mLs by mouth every 4 (four) hours as needed for cough (chest congestion). 06/07/16  Yes Shaune PollackQing Chen, MD  HYDROcodone-acetaminophen (NORCO/VICODIN) 5-325 MG tablet Take 1 tablet by mouth every 6 (six) hours as needed for moderate pain.   Yes Historical Provider, MD  isosorbide mononitrate (IMDUR) 30 MG 24 hr tablet Take 1 tablet (30 mg total) by mouth daily. 09/12/15  Yes Katharina Caperima Vaickute, MD  ketoconazole (NIZORAL) 2 % shampoo KETOCONAZOLE, 2% (External Shampoo) - Historical Medication  (2 %) Active   Yes Historical Provider, MD  latanoprost (XALATAN) 0.005 % ophthalmic solution Place 1 drop into both eyes at bedtime.    Yes Historical Provider, MD  LORazepam (ATIVAN) 1 MG tablet Take 1 mg by mouth every 8 (eight) hours as needed for anxiety (PTSD).   Yes Historical Provider, MD  Multiple Vitamins-Minerals (CENTRUM SILVER) tablet Take 1 tablet by mouth daily.    Yes Historical Provider, MD  nitroGLYCERIN (  NITROSTAT) 0.4 MG SL tablet Place under the tongue.   Yes Historical Provider, MD  olmesartan (BENICAR) 20 MG tablet Take 1 tablet by mouth daily.   Yes Historical Provider, MD  ondansetron (ZOFRAN ODT) 4 MG disintegrating tablet Take 1 tablet (4 mg total) by mouth every 8 (eight) hours as needed for nausea or vomiting. 06/03/16  Yes Reubin Milan, MD    rosuvastatin (CRESTOR) 40 MG tablet Take 20 mg by mouth daily.   Yes Historical Provider, MD  vitamin C (ASCORBIC ACID) 500 MG tablet Take 1,000 mg by mouth daily.    Yes Historical Provider, MD    Allergies  Allergen Reactions  . Imdur [Isosorbide Dinitrate]   . Nortriptyline Hcl   . Prazosin Hcl     joint pain  . Quetiapine Fumarate   . Tizanidine     Past Surgical History:  Procedure Laterality Date  . CARDIAC CATHETERIZATION N/A 09/12/2015   Procedure: Left Heart Cath and Coronary Angiography;  Surgeon: Lamar Blinks, MD;  Location: [facility] INVASIVE CV LAB;  Service: Cardiovascular;  Laterality: N/A;  . CERVICAL SPINE SURGERY    . COLONOSCOPY  2008  . CORONARY ANGIOPLASTY WITH STENT PLACEMENT    . CORONARY ANGIOPLASTY WITH STENT PLACEMENT    . NASAL SINUS SURGERY    . SALIVARY GLAND SURGERY Right 2011   oncocytoma  . TONSILLECTOMY      Social History  Substance Use Topics  . Smoking status: Never Smoker  . Smokeless tobacco: Never Used  . Alcohol use No     Medication list has been reviewed and updated.   Physical Exam  Constitutional: He is oriented to person, place, and time. He appears well-developed. No distress.  HENT:  Head: Normocephalic and atraumatic.  Neck: Normal range of motion. Neck supple. Carotid bruit is not present.  Cardiovascular: Normal rate, regular rhythm and normal heart sounds.   Pulmonary/Chest: Effort normal and breath sounds normal. No respiratory distress. He has no wheezes. He has no rales.  Musculoskeletal: Normal range of motion.  Neurological: He is alert and oriented to person, place, and time. He has normal strength.  Skin: Skin is warm and dry. No rash noted.  Psychiatric: He has a normal mood and affect. His speech is normal and behavior is normal. Thought content normal.  Nursing note and vitals reviewed.   BP 120/78   Pulse 60   Ht 5\' 11"  (1.803 m)   Wt 160 lb (72.6 kg)   BMI 22.32 kg/m   Assessment and Plan: 1.  Community acquired pneumonia, unspecified laterality Resolving clinically - CBC with Differential/Platelet - DG Chest 2 View; Future  2. Angina pectoris (HCC) No recurrence Unable to tolerate Imdur.   No orders of the defined types were placed in this encounter.   Bari Edward, MD [facility]  07/12/2016

## 2016-07-13 LAB — CBC WITH DIFFERENTIAL/PLATELET
BASOS: 1 %
Basophils Absolute: 0 10*3/uL (ref 0.0–0.2)
EOS (ABSOLUTE): 0 10*3/uL (ref 0.0–0.4)
Eos: 1 %
Hematocrit: 40.3 % (ref 37.5–51.0)
Hemoglobin: 12.8 g/dL — ABNORMAL LOW (ref 13.0–17.7)
Immature Grans (Abs): 0 10*3/uL (ref 0.0–0.1)
Immature Granulocytes: 0 %
LYMPHS ABS: 1.4 10*3/uL (ref 0.7–3.1)
LYMPHS: 29 %
MCH: 32.1 pg (ref 26.6–33.0)
MCHC: 31.8 g/dL (ref 31.5–35.7)
MCV: 101 fL — ABNORMAL HIGH (ref 79–97)
MONOCYTES: 9 %
Monocytes Absolute: 0.4 10*3/uL (ref 0.1–0.9)
Neutrophils Absolute: 2.8 10*3/uL (ref 1.4–7.0)
Neutrophils: 60 %
PLATELETS: 150 10*3/uL (ref 150–379)
RBC: 3.99 x10E6/uL — ABNORMAL LOW (ref 4.14–5.80)
RDW: 14.3 % (ref 12.3–15.4)
WBC: 4.7 10*3/uL (ref 3.4–10.8)

## 2016-07-22 DIAGNOSIS — L570 Actinic keratosis: Secondary | ICD-10-CM | POA: Diagnosis not present

## 2016-08-13 DIAGNOSIS — Z9889 Other specified postprocedural states: Secondary | ICD-10-CM | POA: Diagnosis not present

## 2016-08-13 DIAGNOSIS — E785 Hyperlipidemia, unspecified: Secondary | ICD-10-CM | POA: Diagnosis not present

## 2016-08-13 DIAGNOSIS — I10 Essential (primary) hypertension: Secondary | ICD-10-CM | POA: Diagnosis not present

## 2016-08-13 DIAGNOSIS — I2510 Atherosclerotic heart disease of native coronary artery without angina pectoris: Secondary | ICD-10-CM | POA: Diagnosis not present

## 2016-08-14 DIAGNOSIS — H401111 Primary open-angle glaucoma, right eye, mild stage: Secondary | ICD-10-CM | POA: Diagnosis not present

## 2016-08-14 DIAGNOSIS — H401123 Primary open-angle glaucoma, left eye, severe stage: Secondary | ICD-10-CM | POA: Diagnosis not present

## 2016-08-14 DIAGNOSIS — H2513 Age-related nuclear cataract, bilateral: Secondary | ICD-10-CM | POA: Diagnosis not present

## 2016-09-09 DIAGNOSIS — D229 Melanocytic nevi, unspecified: Secondary | ICD-10-CM | POA: Diagnosis not present

## 2016-09-09 DIAGNOSIS — D485 Neoplasm of uncertain behavior of skin: Secondary | ICD-10-CM | POA: Diagnosis not present

## 2016-09-24 DIAGNOSIS — D229 Melanocytic nevi, unspecified: Secondary | ICD-10-CM | POA: Diagnosis not present

## 2016-09-24 DIAGNOSIS — D485 Neoplasm of uncertain behavior of skin: Secondary | ICD-10-CM | POA: Diagnosis not present

## 2016-10-07 ENCOUNTER — Telehealth: Payer: Self-pay | Admitting: Internal Medicine

## 2016-10-07 NOTE — Telephone Encounter (Signed)
Pt stated that he would like a referral to a new cardiologist. [facility]  Phone# [phone]. Please advise. Thanks TNP

## 2016-10-15 ENCOUNTER — Other Ambulatory Visit: Payer: Self-pay | Admitting: Internal Medicine

## 2016-10-15 DIAGNOSIS — I2510 Atherosclerotic heart disease of native coronary artery without angina pectoris: Secondary | ICD-10-CM

## 2016-10-15 NOTE — Telephone Encounter (Signed)
Done

## 2016-10-15 NOTE — Telephone Encounter (Signed)
Pt informed of referral sent and has already been called to schedule appt. Pt states he wants Dr. Judithann GravesBerglund to know :  One major reason he is switching is his cardiologist doesn't spend any time in office with him. He has reassigned pt to PA in last office visit. He spends about 90 seconds with him in exam room, and he wants him to continue seeing a PA. He decided to switch due to this issue of not being able to see actual cardiologist anymore. Wanted to inform Doctor of reason.

## 2016-11-19 ENCOUNTER — Ambulatory Visit (INDEPENDENT_AMBULATORY_CARE_PROVIDER_SITE_OTHER): Payer: Medicare PPO | Admitting: Internal Medicine

## 2016-11-19 ENCOUNTER — Encounter: Payer: Self-pay | Admitting: Internal Medicine

## 2016-11-19 VITALS — BP 120/68 | HR 67 | Temp 98.0°F | Ht 71.0 in | Wt 168.0 lb

## 2016-11-19 DIAGNOSIS — J019 Acute sinusitis, unspecified: Secondary | ICD-10-CM | POA: Diagnosis not present

## 2016-11-19 DIAGNOSIS — H6992 Unspecified Eustachian tube disorder, left ear: Secondary | ICD-10-CM

## 2016-11-19 MED ORDER — AMOXICILLIN-POT CLAVULANATE 875-125 MG PO TABS: 1.0000 | ORAL_TABLET | Freq: Two times a day (BID) | ORAL | 0 refills | Status: DC

## 2016-11-19 NOTE — Progress Notes (Signed)
Date:  11/19/2016   Name:  Dominic Mcmillan   DOB:  03/04/1946   MRN:  [id number]   Chief Complaint: Sinusitis (X 3 weeks - thought it would get better after OTC meds but not better. Drainage in throat and right ear aching.)  Sinusitis  This is a new problem. The current episode started 1 to 4 weeks ago. The problem has been gradually worsening since onset. There has been no fever. The pain is moderate. Associated symptoms include ear pain, sinus pressure, a sore throat and swollen glands. Pertinent negatives include no chills, coughing or shortness of breath. Past treatments include oral decongestants.     Review of Systems  Constitutional: Negative for chills, fatigue and fever.  HENT: Positive for ear pain, sinus pressure and sore throat.   Respiratory: Negative for cough, chest tightness and shortness of breath.   Cardiovascular: Negative for chest pain.    Patient Active Problem List   Diagnosis Date Noted  . Sepsis (HCC) 06/04/2016  . Angina pectoris (HCC) 09/12/2015  . Thrombocytopenia (HCC) 09/12/2015  . Hyperglycemia 09/12/2015  . Otitis media 03/28/2015  . Familial multiple lipoprotein-type hyperlipidemia 02/13/2015  . Hearing loss 02/13/2015  . Neurosis, posttraumatic 02/13/2015  . Arteriosclerosis of coronary artery 02/13/2015  . Degeneration of intervertebral disc of lumbar region 02/13/2015  . Essential (primary) hypertension 02/13/2015  . Discoloration of nail 02/13/2015  . GERD (gastroesophageal reflux disease) 03/14/2014  . History of cardiac catheterization 03/11/2014    Prior to Admission medications   Medication Sig Start Date End Date Taking? Authorizing Provider  aspirin (ECOTRIN LOW STRENGTH) 81 MG EC tablet Take 1 tablet by mouth daily.   Yes [provider]  brimonidine (ALPHAGAN) 0.2 % ophthalmic solution Place 1 drop into the left eye 2 (two) times daily.    Yes [provider]  Cholecalciferol (HM VITAMIN D3) 2000 units  CAPS Take 1 capsule by mouth daily.    Yes [provider]  clopidogrel (PLAVIX) 75 MG tablet Take 1 tablet by mouth daily.   Yes [provider]  co-enzyme Q-10 30 MG capsule Take 30 mg by mouth 3 (three) times daily.   Yes [provider]  DORZOLAMIDE HCL-TIMOLOL MAL OP Place 1 drop into the left eye 2 (two) times daily.    Yes [provider]  HYDROcodone-acetaminophen (NORCO/VICODIN) 5-325 MG tablet Take 1 tablet by mouth every 6 (six) hours as needed for moderate pain.   Yes [provider]  ketoconazole (NIZORAL) 2 % shampoo KETOCONAZOLE, 2% (External Shampoo) - Historical Medication  (2 %) Active   Yes [provider]  latanoprost (XALATAN) 0.005 % ophthalmic solution Place 1 drop into both eyes at bedtime.    Yes [provider]  Multiple Vitamins-Minerals (CENTRUM SILVER) tablet Take 1 tablet by mouth daily.    Yes [provider]  nitroGLYCERIN (NITROSTAT) 0.4 MG SL tablet Place under the tongue.   Yes [provider]  olmesartan (BENICAR) 20 MG tablet Take 1 tablet by mouth daily.   Yes [provider]  rosuvastatin (CRESTOR) 40 MG tablet Take 20 mg by mouth daily.   Yes [provider]  vitamin C (ASCORBIC ACID) 500 MG tablet Take 1,000 mg by mouth daily.    Yes [provider]    Allergies  Allergen Reactions  . Nortriptyline Hcl   . Prazosin Hcl     joint pain  . Quetiapine Fumarate   . Tizanidine   . Imdur [employer][Isosorbide  Dinitrate] Other (See Comments)    Hypotension    Past Surgical History:  Procedure Laterality Date  . CARDIAC CATHETERIZATION N/A 09/12/2015   Procedure: Left Heart Cath and Coronary Angiography;  Surgeon: Lamar Blinks, MD;  Location: [facility] INVASIVE CV LAB;  Service: Cardiovascular;  Laterality: N/A;  . CERVICAL SPINE SURGERY    . COLONOSCOPY  2008  . CORONARY ANGIOPLASTY WITH STENT PLACEMENT    . CORONARY ANGIOPLASTY WITH STENT PLACEMENT    .  NASAL SINUS SURGERY    . SALIVARY GLAND SURGERY Right 2011   oncocytoma  . TONSILLECTOMY      Social History  Substance Use Topics  . Smoking status: Never Smoker  . Smokeless tobacco: Never Used  . Alcohol use No     Medication list has been reviewed and updated.   Physical Exam  Constitutional: He is oriented to person, place, and time. He appears well-developed and well-nourished.  HENT:  Right Ear: External ear and ear canal normal. Tympanic membrane is retracted. Tympanic membrane is not erythematous.  Left Ear: External ear and ear canal normal. Tympanic membrane is retracted. Tympanic membrane is not erythematous. A middle ear effusion is present.  Nose: Right sinus exhibits no maxillary sinus tenderness and no frontal sinus tenderness. Left sinus exhibits no maxillary sinus tenderness and no frontal sinus tenderness.  Mouth/Throat: Uvula is midline and mucous membranes are normal. No oral lesions. Posterior oropharyngeal erythema present. No oropharyngeal exudate.  Cardiovascular: Normal rate, regular rhythm and normal heart sounds.   Pulmonary/Chest: Breath sounds normal. He has no wheezes. He has no rales.  Lymphadenopathy:    He has no cervical adenopathy.  Neurological: He is alert and oriented to person, place, and time.    BP 120/68   Pulse 67   Ht 5\' 11"  (1.803 m)   Wt 168 lb (76.2 kg)   SpO2 98%   BMI 23.43 kg/m   Assessment and Plan: 1. Acute non-recurrent sinusitis, unspecified location Continue coricidin HBP - amoxicillin-clavulanate (AUGMENTIN) 875-125 MG tablet; Take 1 tablet by mouth 2 (two) times daily.  Dispense: 20 tablet; Refill: 0  2. Eustachian tube disorder, left Resume Flonase nasal spray   Meds ordered this encounter  Medications  . amoxicillin-clavulanate (AUGMENTIN) 875-125 MG tablet    Sig: Take 1 tablet by mouth 2 (two) times daily.    Dispense:  20 tablet    Refill:  0    Bari Edward, MD [facility]  11/19/2016

## 2016-11-19 NOTE — Patient Instructions (Signed)
Continue Coricidin HBP and start Flonase nasal spray daily

## 2016-11-27 DIAGNOSIS — E785 Hyperlipidemia, unspecified: Secondary | ICD-10-CM | POA: Diagnosis not present

## 2016-11-27 DIAGNOSIS — Z7982 Long term (current) use of aspirin: Secondary | ICD-10-CM | POA: Diagnosis not present

## 2016-11-27 DIAGNOSIS — Z955 Presence of coronary angioplasty implant and graft: Secondary | ICD-10-CM | POA: Diagnosis not present

## 2016-11-27 DIAGNOSIS — G8929 Other chronic pain: Secondary | ICD-10-CM | POA: Diagnosis not present

## 2016-11-27 DIAGNOSIS — I2510 Atherosclerotic heart disease of native coronary artery without angina pectoris: Secondary | ICD-10-CM | POA: Diagnosis not present

## 2016-11-27 DIAGNOSIS — K219 Gastro-esophageal reflux disease without esophagitis: Secondary | ICD-10-CM | POA: Diagnosis not present

## 2016-11-27 DIAGNOSIS — I10 Essential (primary) hypertension: Secondary | ICD-10-CM | POA: Diagnosis not present

## 2016-11-27 DIAGNOSIS — K30 Functional dyspepsia: Secondary | ICD-10-CM | POA: Diagnosis not present

## 2016-11-28 ENCOUNTER — Other Ambulatory Visit: Payer: Self-pay

## 2016-11-28 ENCOUNTER — Telehealth: Payer: Self-pay

## 2016-11-28 DIAGNOSIS — J0191 Acute recurrent sinusitis, unspecified: Secondary | ICD-10-CM

## 2016-11-28 DIAGNOSIS — H6992 Unspecified Eustachian tube disorder, left ear: Secondary | ICD-10-CM

## 2016-11-28 NOTE — Telephone Encounter (Signed)
Patient called and requested referral for ENT since antibiotics is not helping with eustachian tube disorder and sinusitis still not any better. Sent referral in for pt.

## 2016-12-02 ENCOUNTER — Telehealth: Payer: Self-pay

## 2016-12-02 ENCOUNTER — Encounter: Payer: Self-pay | Admitting: Internal Medicine

## 2016-12-02 NOTE — Telephone Encounter (Signed)
Patient called stating [employer] informed him its time to get a cologuard done. Pt requesting for a order to be sent in for this. Please Advise.

## 2016-12-02 NOTE — Telephone Encounter (Signed)
He is not a candidate for Cologuard since he has had a colonoscopy.  There is little value in screening with fecal occult blood testing.  He should contact [facility] to see if he should have a colonoscopy.

## 2016-12-02 NOTE — Telephone Encounter (Signed)
Patient states [facility] will contact [facility] to see if [facility] is needed.

## 2016-12-18 DIAGNOSIS — H9209 Otalgia, unspecified ear: Secondary | ICD-10-CM | POA: Diagnosis not present

## 2016-12-18 DIAGNOSIS — H401111 Primary open-angle glaucoma, right eye, mild stage: Secondary | ICD-10-CM | POA: Insufficient documentation

## 2016-12-18 DIAGNOSIS — H2513 Age-related nuclear cataract, bilateral: Secondary | ICD-10-CM | POA: Diagnosis not present

## 2016-12-18 DIAGNOSIS — H6121 Impacted cerumen, right ear: Secondary | ICD-10-CM | POA: Diagnosis not present

## 2016-12-18 DIAGNOSIS — H401123 Primary open-angle glaucoma, left eye, severe stage: Secondary | ICD-10-CM | POA: Insufficient documentation

## 2016-12-18 DIAGNOSIS — H90A21 Sensorineural hearing loss, unilateral, right ear, with restricted hearing on the contralateral side: Secondary | ICD-10-CM | POA: Diagnosis not present

## 2016-12-18 DIAGNOSIS — K219 Gastro-esophageal reflux disease without esophagitis: Secondary | ICD-10-CM | POA: Diagnosis not present

## 2017-01-28 ENCOUNTER — Telehealth: Payer: Self-pay

## 2017-01-28 NOTE — Telephone Encounter (Signed)
Patient called stating still having ear pain after 4 months. Seen Dr Percell Boston who prescribed him a antibiotic for 10 days but never got better. Stated he wants another referral to [facility] for different ENT.  Dr Judithann Graves reviewed patient OV with Dr Percell Boston who told pt to try otc Zantac and if not better in 30 days to call back to see GI for acid reflux.  Informed pt to contact Dr Percell Boston to have follow up and see GI. He stated "I appreiciate the doctors opinion. Thank you.  Goodbye. "

## 2017-02-20 ENCOUNTER — Ambulatory Visit (INDEPENDENT_AMBULATORY_CARE_PROVIDER_SITE_OTHER): Payer: Medicare PPO

## 2017-02-20 VITALS — BP 142/70 | HR 66 | Temp 97.7°F | Resp 16 | Ht 71.0 in | Wt 173.2 lb

## 2017-02-20 DIAGNOSIS — Z0000 Encounter for general adult medical examination without abnormal findings: Secondary | ICD-10-CM | POA: Diagnosis not present

## 2017-02-20 NOTE — Patient Instructions (Signed)
Dominic Mcmillan , Thank you for taking time to come for your Medicare Wellness Visit. I appreciate your ongoing commitment to your health goals. Please review the following plan we discussed and let me know if I can assist you in the future.   Screening recommendations/referrals: Colonoscopy: Completed 07/2006. Please provide a copy of your colonoscopy results. Please discuss need for repeat colonoscopy with your physician at the [place]. Hep C Screening: Completed. Please provide a copy of your lab results from the [place] Recommended yearly ophthalmology/optometry visit for glaucoma screening and checkup Recommended yearly dental visit for hygiene and checkup  Vaccinations: Influenza vaccine: Completed Pneumococcal vaccine: Completed series Tdap vaccine: Up to date Shingles vaccine: Completed Shingrix series    Advanced directives: Advance directive discussed with you today. Even though you declined this today please call our office should you change your mind and we can give you the proper paperwork for you to fill out.  Conditions/risks identified: Fall risk prevention discussed  Next appointment: Please schedule follow up annual wellnes exam in one year.   Preventive Care 49 Years and Older, Male Preventive care refers to lifestyle choices and visits with your health care provider that can promote health and wellness. What does preventive care include?  A yearly physical exam. This is also called an annual well check.  Dental exams once or twice a year.  Routine eye exams. Ask your health care provider how often you should have your eyes checked.  Personal lifestyle choices, including:  Daily care of your teeth and gums.  Regular physical activity.  Eating a healthy diet.  Avoiding tobacco and drug use.  Limiting alcohol use.  Practicing safe sex.  Taking low-dose aspirin every day.  Taking vitamin and mineral supplements as recommended by your health care provider. What  happens during an annual well check? The services and screenings done by your health care provider during your annual well check will depend on your age, overall health, lifestyle risk factors, and family history of disease. Counseling  Your health care provider may ask you questions about your:  Alcohol use.  Tobacco use.  Drug use.  Emotional well-being.  Home and relationship well-being.  Sexual activity.  Eating habits.  History of falls.  Memory and ability to understand (cognition).  Work and work Astronomer.  Reproductive health. Screening  You may have the following tests or measurements:  Height, weight, and BMI.  Blood pressure.  Lipid and cholesterol levels. These may be checked every 5 years, or more frequently if you are over 92 years old.  Skin check.  Lung cancer screening. You may have this screening every year starting at age 39 if you have a 30-pack-year history of smoking and currently smoke or have quit within the past 15 years.  Fecal occult blood test (FOBT) of the stool. You may have this test every year starting at age 21.  Flexible sigmoidoscopy or colonoscopy. You may have a sigmoidoscopy every 5 years or a colonoscopy every 10 years starting at age 70.  Hepatitis C blood test.  Hepatitis B blood test.  Sexually transmitted disease (STD) testing.  Diabetes screening. This is done by checking your blood sugar (glucose) after you have not eaten for a while (fasting). You may have this done every 1-3 years.  Bone density scan. This is done to screen for osteoporosis. You may have this done starting at age 35.  Mammogram. This may be done every 1-2 years. Talk to your health care provider about how  often you should have regular mammograms. Talk with your health care provider about your test results, treatment options, and if necessary, the need for more tests. Vaccines  Your health care provider may recommend certain vaccines, such  as:  Influenza vaccine. This is recommended every year.  Tetanus, diphtheria, and acellular pertussis (Tdap, Td) vaccine. You may need a Td booster every 10 years.  Zoster vaccine. You may need this after age 81.  Pneumococcal 13-valent conjugate (PCV13) vaccine. One dose is recommended after age 69.  Pneumococcal polysaccharide (PPSV23) vaccine. One dose is recommended after age 41. Talk to your health care provider about which screenings and vaccines you need and how often you need them. This information is not intended to replace advice given to you by your health care provider. Make sure you discuss any questions you have with your health care provider. Document Released: 05/12/2015 Document Revised: 01/03/2016 Document Reviewed: 02/14/2015 Elsevier Interactive Patient Education  2017 [place] Prevention in the Home Falls can cause injuries. They can happen to people of all ages. There are many things you can do to make your home safe and to help prevent falls. What can I do on the outside of my home?  Regularly fix the edges of walkways and driveways and fix any cracks.  Remove anything that might make you trip as you walk through a door, such as a raised step or threshold.  Trim any bushes or trees on the path to your home.  Use bright outdoor lighting.  Clear any walking paths of anything that might make someone trip, such as rocks or tools.  Regularly check to see if handrails are loose or broken. Make sure that both sides of any steps have handrails.  Any raised decks and porches should have guardrails on the edges.  Have any leaves, snow, or ice cleared regularly.  Use sand or salt on walking paths during winter.  Clean up any spills in your garage right away. This includes oil or grease spills. What can I do in the bathroom?  Use night lights.  Install grab bars by the toilet and in the tub and shower. Do not use towel bars as grab bars.  Use  non-skid mats or decals in the tub or shower.  If you need to sit down in the shower, use a plastic, non-slip stool.  Keep the floor dry. Clean up any water that spills on the floor as soon as it happens.  Remove soap buildup in the tub or shower regularly.  Attach bath mats securely with double-sided non-slip rug tape.  Do not have throw rugs and other things on the floor that can make you trip. What can I do in the bedroom?  Use night lights.  Make sure that you have a light by your bed that is easy to reach.  Do not use any sheets or blankets that are too big for your bed. They should not hang down onto the floor.  Have a firm chair that has side arms. You can use this for support while you get dressed.  Do not have throw rugs and other things on the floor that can make you trip. What can I do in the kitchen?  Clean up any spills right away.  Avoid walking on wet floors.  Keep items that you use a lot in easy-to-reach places.  If you need to reach something above you, use a strong step stool that has a grab bar.  Keep electrical  cords out of the way.  Do not use floor polish or wax that makes floors slippery. If you must use wax, use non-skid floor wax.  Do not have throw rugs and other things on the floor that can make you trip. What can I do with my stairs?  Do not leave any items on the stairs.  Make sure that there are handrails on both sides of the stairs and use them. Fix handrails that are broken or loose. Make sure that handrails are as long as the stairways.  Check any carpeting to make sure that it is firmly attached to the stairs. Fix any carpet that is loose or worn.  Avoid having throw rugs at the top or bottom of the stairs. If you do have throw rugs, attach them to the floor with carpet tape.  Make sure that you have a light switch at the top of the stairs and the bottom of the stairs. If you do not have them, ask someone to add them for you. What  else can I do to help prevent falls?  Wear shoes that:  Do not have high heels.  Have rubber bottoms.  Are comfortable and fit you well.  Are closed at the toe. Do not wear sandals.  If you use a stepladder:  Make sure that it is fully opened. Do not climb a closed stepladder.  Make sure that both sides of the stepladder are locked into place.  Ask someone to hold it for you, if possible.  Clearly mark and make sure that you can see:  Any grab bars or handrails.  First and last steps.  Where the edge of each step is.  Use tools that help you move around (mobility aids) if they are needed. These include:  Canes.  Walkers.  Scooters.  Crutches.  Turn on the lights when you go into a dark area. Replace any light bulbs as soon as they burn out.  Set up your furniture so you have a clear path. Avoid moving your furniture around.  If any of your floors are uneven, fix them.  If there are any pets around you, be aware of where they are.  Review your medicines with your doctor. Some medicines can make you feel dizzy. This can increase your chance of falling. Ask your doctor what other things that you can do to help prevent falls. This information is not intended to replace advice given to you by your health care provider. Make sure you discuss any questions you have with your health care provider. Document Released: 02/09/2009 Document Revised: 09/21/2015 Document Reviewed: 05/20/2014 Elsevier Interactive Patient Education  2017 [employer]

## 2017-02-20 NOTE — Progress Notes (Signed)
Subjective:   Dominic Mcmillan is a 71 y.o. male who presents for an Initial Medicare Annual Wellness Visit.  Review of Systems  N/A Cardiac Risk Factors include: advanced age (>33men, >10 women);hypertension;obesity (BMI >30kg/m2);male gender    Objective:    Today's Vitals   02/20/17 0805  BP: (!) 142/70  Pulse: 66  Resp: 16  Temp: 97.7 F (36.5 C)  TempSrc: Oral  Weight: 173 lb 3.2 oz (78.6 kg)  Height: 5\' 11"  (1.803 m)   Body mass index is 24.16 kg/m.  Current Medications (verified) Outpatient Encounter Prescriptions as of 02/20/2017  Medication Sig  . aspirin (ECOTRIN LOW STRENGTH) 81 MG EC tablet Take 1 tablet by mouth daily.  . brimonidine (ALPHAGAN) 0.2 % ophthalmic solution Place 1 drop into the left eye 2 (two) times daily.   . Cholecalciferol (HM VITAMIN D3) 2000 units CAPS Take 1 capsule by mouth daily.   . clopidogrel (PLAVIX) 75 MG tablet Take 1 tablet by mouth daily.  Marland Kitchen co-enzyme Q-10 30 MG capsule Take 30 mg by mouth 3 (three) times daily.  . DORZOLAMIDE HCL-TIMOLOL MAL OP Place 1 drop into the left eye 2 (two) times daily.   Marland Kitchen HYDROcodone-acetaminophen (NORCO/VICODIN) 5-325 MG tablet Take 1 tablet by mouth every 6 (six) hours as needed for moderate pain.  Marland Kitchen ketoconazole (NIZORAL) 2 % shampoo KETOCONAZOLE, 2% (External Shampoo) - Historical Medication  (2 %) Active  . latanoprost (XALATAN) 0.005 % ophthalmic solution Place 1 drop into both eyes at bedtime.   . Multiple Vitamins-Minerals (CENTRUM SILVER) tablet Take 1 tablet by mouth daily.   . nitroGLYCERIN (NITROSTAT) 0.4 MG SL tablet Place under the tongue.  Marland Kitchen olmesartan (BENICAR) 20 MG tablet Take 1 tablet by mouth daily.  . rosuvastatin (CRESTOR) 40 MG tablet Take 20 mg by mouth daily.  . vitamin C (ASCORBIC ACID) 500 MG tablet Take 1,000 mg by mouth daily.   . [DISCONTINUED] amoxicillin-clavulanate (AUGMENTIN) 875-125 MG tablet Take 1 tablet by mouth 2 (two) times daily.   No  facility-administered encounter medications on file as of 02/20/2017.     Allergies (verified) Nortriptyline hcl; Prazosin hcl; Quetiapine fumarate; Tizanidine; and Imdur [isosorbide dinitrate]   History: Past Medical History:  Diagnosis Date  . Acute MI (HCC)   . CAD (coronary artery disease)   . Glaucoma   . HLD (hyperlipidemia)   . HTN (hypertension)   . PTSD (post-traumatic stress disorder)    Past Surgical History:  Procedure Laterality Date  . CARDIAC CATHETERIZATION N/A 09/12/2015   Procedure: Left Heart Cath and Coronary Angiography;  Surgeon: Dominic Blinks, MD;  Location: [facility] INVASIVE CV LAB;  Service: Cardiovascular;  Laterality: N/A;  . CERVICAL SPINE SURGERY    . COLONOSCOPY  2008  . CORONARY ANGIOPLASTY WITH STENT PLACEMENT    . CORONARY ANGIOPLASTY WITH STENT PLACEMENT    . NASAL SINUS SURGERY    . SALIVARY GLAND SURGERY Right 2011   oncocytoma  . TONSILLECTOMY     Family History  Problem Relation Age of Onset  . Hypertension Mother    Social History   Occupational History  . Not on file.   Social History Main Topics  . Smoking status: Never Smoker  . Smokeless tobacco: Never Used  . Alcohol use No  . Drug use: No  . Sexual activity: Not on file   Tobacco Counseling Counseling given: Not Answered   Activities of Daily Living In your present state of health, do you have any difficulty  performing the following activities: 02/20/2017 06/04/2016  Hearing? N Y  Vision? N N  Difficulty concentrating or making decisions? N N  Walking or climbing stairs? N N  Dressing or bathing? N N  Doing errands, shopping? N N  Preparing Food and eating ? N -  Using the Toilet? N -  In the past six months, have you accidently leaked urine? N -  Do you have problems with loss of bowel control? N -  Managing your Medications? N -  Managing your Finances? N -  Housekeeping or managing your Housekeeping? N -  Some recent data might be hidden    Immunizations  and Health Maintenance Immunization History  Administered Date(s) Administered  . Influenza-Unspecified 12/28/2013, 01/12/2015  . Pneumococcal Polysaccharide-23 09/27/2008  . Tdap 03/30/2011  . Zoster 04/30/2008   Health Maintenance Due  Topic Date Due  . Hepatitis C Screening  10-23-1945  . COLONOSCOPY  10/01/1993    Patient Care Team: Dominic Milan, MD as PCP - General (Family Medicine)  Indicate any recent Medical Services you may have received from other than [facility] providers in the past year (date may be approximate).    Assessment:   This is a routine wellness examination for Dominic Mcmillan.   Hearing/Vision screen Vision Screening Comments: Sees Dr. Marcell Mcmillan at [facility] for annual eye exams  Dietary issues and exercise activities discussed: Current Exercise Habits: The patient does not participate in regular exercise at present, Exercise limited by: None identified  Goals    . Exercise 150 minutes per week (moderate activity)          Continue to remain active      Depression Screen PHQ 2/9 Scores 02/20/2017 08/02/2015  PHQ - 2 Score 0 0    Fall Risk Fall Risk  02/20/2017 08/02/2015  Falls in the past year? No No    Cognitive Function: declined        Screening Tests Health Maintenance  Topic Date Due  . Hepatitis C Screening  [birth year]  . COLONOSCOPY  10/01/1993  . TETANUS/TDAP  03/29/2021  . INFLUENZA VACCINE  Completed  . PNA vac Low Risk Adult  Completed        Plan:   I have personally reviewed and addressed the Medicare Annual Wellness questionnaire and have noted the following in the patient's chart:  A. Medical and social history B. Use of alcohol, tobacco or illicit drugs  C. Current medications and supplements D. Functional ability and status E.  Nutritional status F.  Physical activity G. Advance directives H. List of other physicians I.  Hospitalizations, surgeries, and ER visits in previous 12 months J.  Vitals K. Screenings such as  hearing and vision if needed, cognitive and depression L. Referrals and appointments - none  In addition, I have reviewed and discussed with patient certain preventive protocols, quality metrics, and best practice recommendations. A written personalized care plan for preventive services as well as general preventive health recommendations were provided to patient.  See attached scanned questionnaire for additional information.   Signed,  Deon Pilling, LPN Nurse Health Advisor  MD Recommendations: Pt is due for Hep C screening. Declined testing today. States he has had blood work completed at the [place]. Asked pt to provide our office with a copy of these results.  Pt due for colonoscopy. States he has completed a colonoscopy in 07/2006, 1 polyp removed, recommended repeat in 10 years. Not a candidate for Cologuard due to previous history of colon polyps. Asked pt  to provide results of previous colonoscopy. Also [facility].

## 2017-02-24 ENCOUNTER — Encounter: Payer: Self-pay | Admitting: Internal Medicine

## 2017-02-24 ENCOUNTER — Ambulatory Visit: Payer: Self-pay

## 2017-02-24 ENCOUNTER — Ambulatory Visit (INDEPENDENT_AMBULATORY_CARE_PROVIDER_SITE_OTHER): Payer: Medicare PPO | Admitting: Internal Medicine

## 2017-02-24 VITALS — BP 122/84 | HR 68 | Temp 98.4°F | Resp 16 | Ht 71.0 in | Wt 170.2 lb

## 2017-02-24 DIAGNOSIS — J40 Bronchitis, not specified as acute or chronic: Secondary | ICD-10-CM

## 2017-02-24 MED ORDER — AMOXICILLIN-POT CLAVULANATE 875-125 MG PO TABS: 1.0000 | ORAL_TABLET | Freq: Two times a day (BID) | ORAL | 0 refills | Status: AC

## 2017-02-24 NOTE — Patient Instructions (Signed)
Flush your sinuses about once a day.

## 2017-02-24 NOTE — Progress Notes (Signed)
Date:  02/24/2017   Name:  Dominic Mcmillan   DOB:  Sep 17, 1945   MRN:  [id number]   Chief Complaint: Sinus Problem (sore throat, chest congestion, and cough. x 2 weeks) Sinus Problem  This is a new problem. The current episode started 1 to 4 weeks ago. The problem is unchanged. There has been no fever. The pain is mild. Associated symptoms include congestion, coughing, ear pain, sinus pressure and a sore throat. Pertinent negatives include no chills, headaches, shortness of breath or swollen glands. Treatments tried: over the counter cough syrup. The treatment provided mild relief.     Review of Systems  Constitutional: Negative for chills, fatigue and fever.  HENT: Positive for congestion, ear pain, sinus pressure and sore throat.   Respiratory: Positive for cough. Negative for shortness of breath.   Cardiovascular: Negative for chest pain and palpitations.  Neurological: Negative for dizziness and headaches.    Patient Active Problem List   Diagnosis Date Noted  . Primary open angle glaucoma (POAG) of left eye, severe stage 12/18/2016  . Primary open angle glaucoma (POAG) of right eye, mild stage 12/18/2016  . Primary open angle glaucoma of right eye, mild stage 08/14/2016  . Cataract, nuclear sclerotic, both eyes 08/14/2016  . Sepsis (HCC) 06/04/2016  . Angina pectoris (HCC) 09/12/2015  . Thrombocytopenia (HCC) 09/12/2015  . Hyperglycemia 09/12/2015  . Otitis media 03/28/2015  . Familial multiple lipoprotein-type hyperlipidemia 02/13/2015  . Hearing loss 02/13/2015  . Neurosis, posttraumatic 02/13/2015  . Arteriosclerosis of coronary artery 02/13/2015  . Degeneration of intervertebral disc of lumbar region 02/13/2015  . Essential (primary) hypertension 02/13/2015  . Discoloration of nail 02/13/2015  . GERD (gastroesophageal reflux disease) 03/14/2014  . History of cardiac catheterization 03/11/2014    Prior to Admission medications   Medication Sig Start Date End  Date Taking? Authorizing Provider  aspirin (ECOTRIN LOW STRENGTH) 81 MG EC tablet Take 1 tablet by mouth daily.    [provider]  brimonidine (ALPHAGAN) 0.2 % ophthalmic solution Place 1 drop into the left eye 2 (two) times daily.     [provider]  Cholecalciferol (HM VITAMIN D3) 2000 units CAPS Take 1 capsule by mouth daily.     [provider]  clopidogrel (PLAVIX) 75 MG tablet Take 1 tablet by mouth daily.    [provider]  co-enzyme Q-10 30 MG capsule Take 30 mg by mouth 3 (three) times daily.    [provider]  DORZOLAMIDE HCL-TIMOLOL MAL OP Place 1 drop into the left eye 2 (two) times daily.     [provider]  HYDROcodone-acetaminophen (NORCO/VICODIN) 5-325 MG tablet Take 1 tablet by mouth every 6 (six) hours as needed for moderate pain.    [provider]  ketoconazole (NIZORAL) 2 % shampoo KETOCONAZOLE, 2% (External Shampoo) - Historical Medication  (2 %) Active    [provider]  latanoprost (XALATAN) 0.005 % ophthalmic solution Place 1 drop into both eyes at bedtime.     [provider]  Multiple Vitamins-Minerals (CENTRUM SILVER) tablet Take 1 tablet by mouth daily.     [provider]  nitroGLYCERIN (NITROSTAT) 0.4 MG SL tablet Place under the tongue.    [provider]  olmesartan (BENICAR) 20 MG tablet Take 1 tablet by mouth daily.    [provider]  rosuvastatin (CRESTOR) 40 MG tablet Take 20 mg by mouth daily.    [provider]  vitamin C (ASCORBIC ACID) 500 MG tablet  Take 1,000 mg by mouth daily.     [provider]    Allergies  Allergen Reactions  . Nortriptyline Hcl   . Prazosin Hcl     joint pain  . Quetiapine Fumarate   . Tizanidine   . Imdur [Isosorbide Dinitrate] Other (See Comments)    Hypotension    Past Surgical History:  Procedure Laterality Date  . CARDIAC CATHETERIZATION N/A 09/12/2015   Procedure: Left Heart Cath and  Coronary Angiography;  Surgeon: Lamar BlinksBruce J Kowalski, MD;  Location: [facility] INVASIVE CV LAB;  Service: Cardiovascular;  Laterality: N/A;  . CERVICAL SPINE SURGERY    . COLONOSCOPY  2008  . CORONARY ANGIOPLASTY WITH STENT PLACEMENT    . CORONARY ANGIOPLASTY WITH STENT PLACEMENT    . NASAL SINUS SURGERY    . SALIVARY GLAND SURGERY Right 2011   oncocytoma  . TONSILLECTOMY      Social History  Substance Use Topics  . Smoking status: Never Smoker  . Smokeless tobacco: Never Used  . Alcohol use No     Medication list has been reviewed and updated.  PHQ 2/9 Scores 02/20/2017 08/02/2015  PHQ - 2 Score 0 0    Physical Exam  Constitutional: He is oriented to person, place, and time. He appears well-developed and well-nourished.  HENT:  Right Ear: External ear and ear canal normal. Tympanic membrane is not erythematous and not retracted.  Left Ear: External ear and ear canal normal. Tympanic membrane is not erythematous and not retracted.  Nose: Right sinus exhibits maxillary sinus tenderness. Right sinus exhibits no frontal sinus tenderness. Left sinus exhibits maxillary sinus tenderness. Left sinus exhibits no frontal sinus tenderness.  Mouth/Throat: Uvula is midline and mucous membranes are normal. No oral lesions. Posterior oropharyngeal erythema present. No oropharyngeal exudate.  Cardiovascular: Normal rate, regular rhythm and normal heart sounds.   Pulmonary/Chest: Breath sounds normal. He has no decreased breath sounds. He has no wheezes. He has no rales.  Lymphadenopathy:    He has no cervical adenopathy.  Neurological: He is alert and oriented to person, place, and time.    BP 122/84   Pulse 68   Temp 98.4 F (36.9 C) (Oral)   Resp 16   Ht 5\' 11"  (1.803 m)   Wt 170 lb 3.2 oz (77.2 kg)   SpO2 97%   BMI 23.74 kg/m   Assessment and Plan: 1. Bronchitis Continue sinus rinse and over the counter cough syrup - amoxicillin-clavulanate (AUGMENTIN) 875-125 MG tablet; Take 1 tablet  by mouth 2 (two) times daily.  Dispense: 28 tablet; Refill: 0   Meds ordered this encounter  Medications  . amoxicillin-clavulanate (AUGMENTIN) 875-125 MG tablet    Sig: Take 1 tablet by mouth 2 (two) times daily.    Dispense:  28 tablet    Refill:  0    Partially dictated using Animal nutritionistDragon software. Any errors are unintentional.  Bari EdwardLaura Berglund, MD [facility]  02/24/2017

## 2017-03-15 DIAGNOSIS — K219 Gastro-esophageal reflux disease without esophagitis: Secondary | ICD-10-CM | POA: Diagnosis not present

## 2017-03-15 DIAGNOSIS — R110 Nausea: Secondary | ICD-10-CM | POA: Diagnosis not present

## 2017-03-15 DIAGNOSIS — Z79899 Other long term (current) drug therapy: Secondary | ICD-10-CM | POA: Diagnosis not present

## 2017-03-15 DIAGNOSIS — R0789 Other chest pain: Secondary | ICD-10-CM | POA: Diagnosis not present

## 2017-03-15 DIAGNOSIS — R61 Generalized hyperhidrosis: Secondary | ICD-10-CM | POA: Diagnosis not present

## 2017-03-15 DIAGNOSIS — G8929 Other chronic pain: Secondary | ICD-10-CM | POA: Diagnosis not present

## 2017-03-15 DIAGNOSIS — R0602 Shortness of breath: Secondary | ICD-10-CM | POA: Diagnosis not present

## 2017-03-15 DIAGNOSIS — R001 Bradycardia, unspecified: Secondary | ICD-10-CM | POA: Diagnosis not present

## 2017-03-15 DIAGNOSIS — I252 Old myocardial infarction: Secondary | ICD-10-CM | POA: Diagnosis not present

## 2017-03-15 DIAGNOSIS — R079 Chest pain, unspecified: Secondary | ICD-10-CM | POA: Diagnosis not present

## 2017-03-15 DIAGNOSIS — F4310 Post-traumatic stress disorder, unspecified: Secondary | ICD-10-CM | POA: Diagnosis not present

## 2017-03-15 DIAGNOSIS — Z7982 Long term (current) use of aspirin: Secondary | ICD-10-CM | POA: Diagnosis not present

## 2017-03-15 DIAGNOSIS — D72829 Elevated white blood cell count, unspecified: Secondary | ICD-10-CM | POA: Diagnosis not present

## 2017-03-15 DIAGNOSIS — I2510 Atherosclerotic heart disease of native coronary artery without angina pectoris: Secondary | ICD-10-CM | POA: Diagnosis not present

## 2017-03-15 DIAGNOSIS — R55 Syncope and collapse: Secondary | ICD-10-CM | POA: Diagnosis not present

## 2017-03-15 MED ORDER — MORPHINE SULFATE (PF) 4 MG/ML IV SOLN
2.00 mg | INTRAVENOUS | Status: DC
Start: ? — End: 2017-03-15

## 2017-03-15 MED ORDER — ATORVASTATIN CALCIUM 80 MG PO TABS
80.00 mg | ORAL_TABLET | ORAL | Status: DC
Start: 2017-03-16 — End: 2017-03-15

## 2017-03-15 MED ORDER — BRIMONIDINE TARTRATE 0.2 % OP SOLN
1.00 | OPHTHALMIC | Status: DC
Start: 2017-03-15 — End: 2017-03-15

## 2017-03-15 MED ORDER — GENERIC EXTERNAL MEDICATION
Status: DC
Start: ? — End: 2017-03-15

## 2017-03-15 MED ORDER — ASPIRIN 81 MG PO CHEW
81.00 mg | CHEWABLE_TABLET | ORAL | Status: DC
Start: 2017-03-16 — End: 2017-03-15

## 2017-03-15 MED ORDER — DORZOLAMIDE HCL-TIMOLOL MAL 22.3-6.8 MG/ML OP SOLN
1.00 | OPHTHALMIC | Status: DC
Start: 2017-03-15 — End: 2017-03-15

## 2017-03-15 MED ORDER — ACETAMINOPHEN 325 MG PO TABS
650.00 mg | ORAL_TABLET | ORAL | Status: DC
Start: ? — End: 2017-03-15

## 2017-03-15 MED ORDER — NITROGLYCERIN 0.4 MG SL SUBL
0.40 mg | SUBLINGUAL_TABLET | SUBLINGUAL | Status: DC
Start: ? — End: 2017-03-15

## 2017-03-15 MED ORDER — LATANOPROST 0.005 % OP SOLN
1.00 | OPHTHALMIC | Status: DC
Start: ? — End: 2017-03-15

## 2017-03-15 MED ORDER — FAMOTIDINE 20 MG PO TABS
20.00 mg | ORAL_TABLET | ORAL | Status: DC
Start: 2017-03-15 — End: 2017-03-15

## 2017-03-15 MED ORDER — CLOPIDOGREL BISULFATE 75 MG PO TABS
75.00 mg | ORAL_TABLET | ORAL | Status: DC
Start: 2017-03-16 — End: 2017-03-15

## 2017-03-15 MED ORDER — VALSARTAN 80 MG PO TABS
80.00 mg | ORAL_TABLET | ORAL | Status: DC
Start: 2017-03-16 — End: 2017-03-15

## 2017-03-15 MED ORDER — HYDROCODONE-ACETAMINOPHEN 5-325 MG PO TABS
1.00 | ORAL_TABLET | ORAL | Status: DC
Start: ? — End: 2017-03-15

## 2017-03-25 DIAGNOSIS — D3610 Benign neoplasm of peripheral nerves and autonomic nervous system, unspecified: Secondary | ICD-10-CM | POA: Diagnosis not present

## 2017-03-25 DIAGNOSIS — L570 Actinic keratosis: Secondary | ICD-10-CM | POA: Diagnosis not present

## 2017-03-25 DIAGNOSIS — Z86018 Personal history of other benign neoplasm: Secondary | ICD-10-CM | POA: Diagnosis not present

## 2017-03-26 DIAGNOSIS — H401123 Primary open-angle glaucoma, left eye, severe stage: Secondary | ICD-10-CM | POA: Diagnosis not present

## 2017-03-26 DIAGNOSIS — H401111 Primary open-angle glaucoma, right eye, mild stage: Secondary | ICD-10-CM | POA: Diagnosis not present

## 2017-03-26 DIAGNOSIS — H2513 Age-related nuclear cataract, bilateral: Secondary | ICD-10-CM | POA: Diagnosis not present

## 2017-03-27 DIAGNOSIS — R079 Chest pain, unspecified: Secondary | ICD-10-CM | POA: Diagnosis not present

## 2017-04-02 DIAGNOSIS — E782 Mixed hyperlipidemia: Secondary | ICD-10-CM | POA: Diagnosis not present

## 2017-04-02 DIAGNOSIS — I10 Essential (primary) hypertension: Secondary | ICD-10-CM | POA: Diagnosis not present

## 2017-04-02 DIAGNOSIS — I2510 Atherosclerotic heart disease of native coronary artery without angina pectoris: Secondary | ICD-10-CM | POA: Diagnosis not present

## 2017-04-14 IMAGING — CR DG CHEST 2V
2 series · 3 of 3 positions shown · non-contrast
Comparison: 06/04/2016

CLINICAL DATA: Pneumonia

EXAM:
CHEST  2 VIEW

[Series 1: chest pa · 0.14mm/px · 2 of 2 slices shown]
[im 1/2]
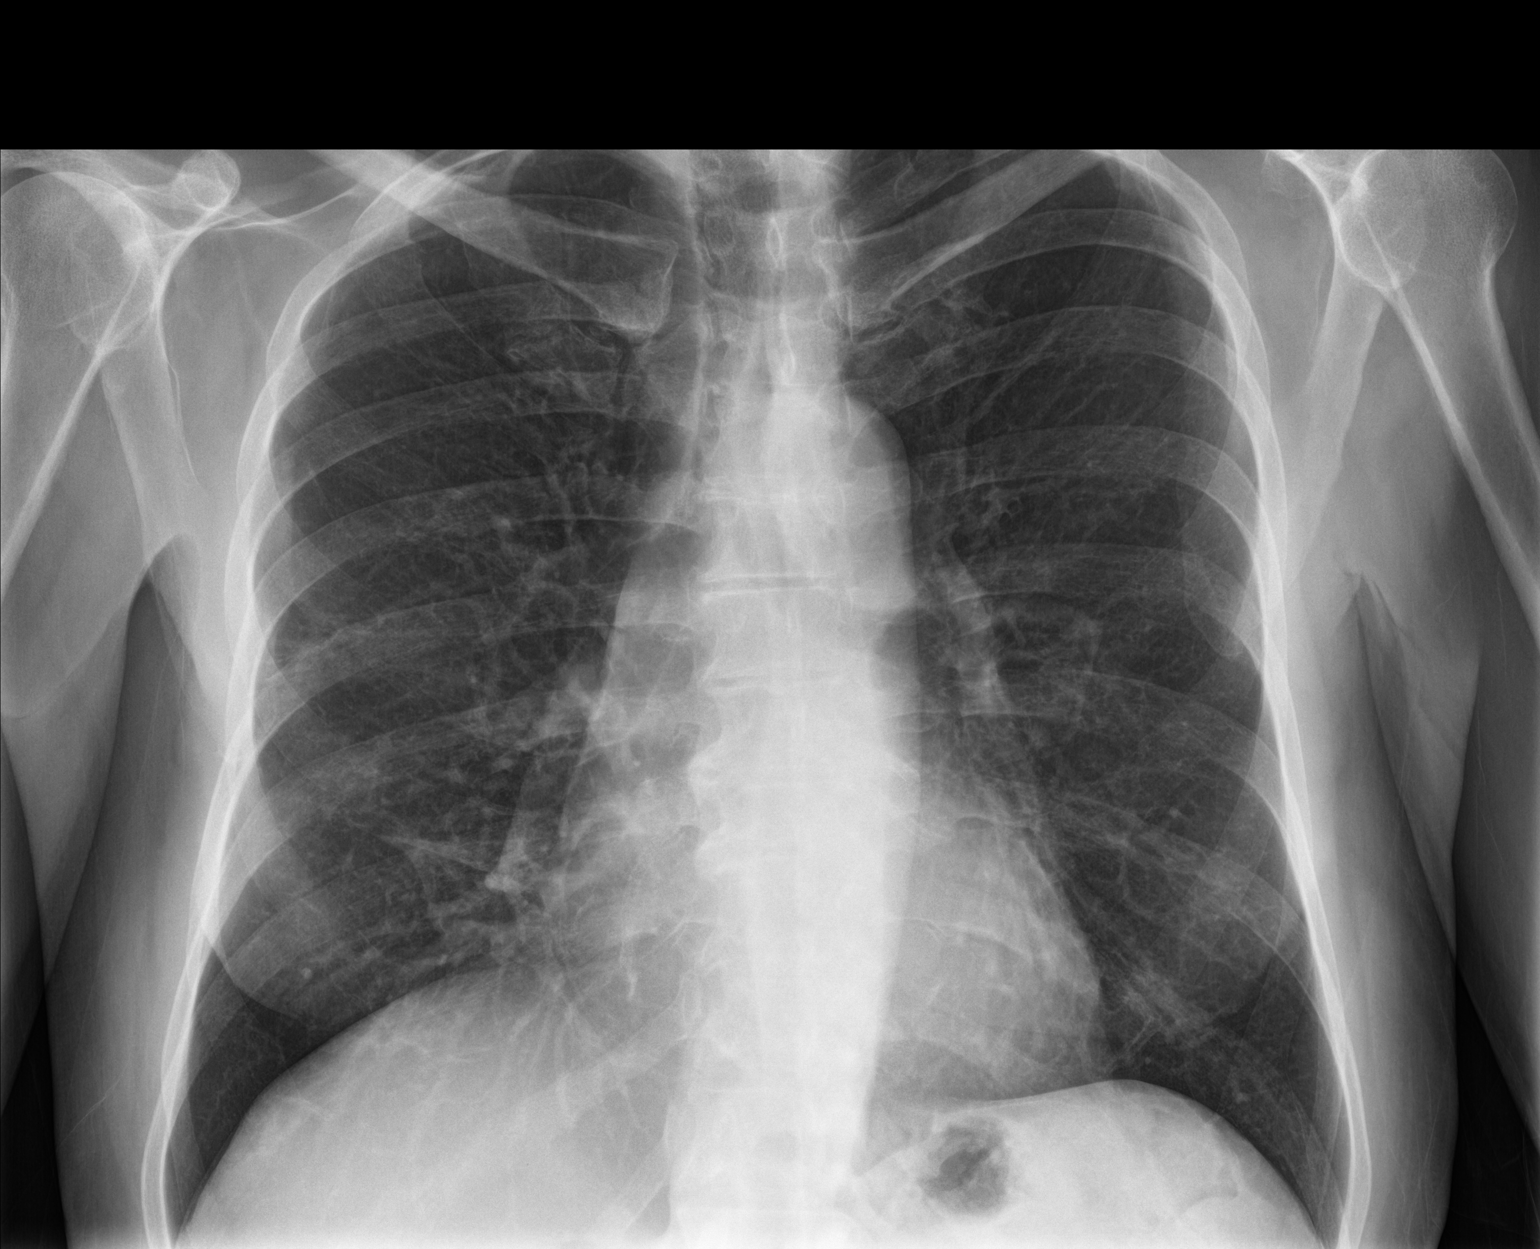
[im 2/2]
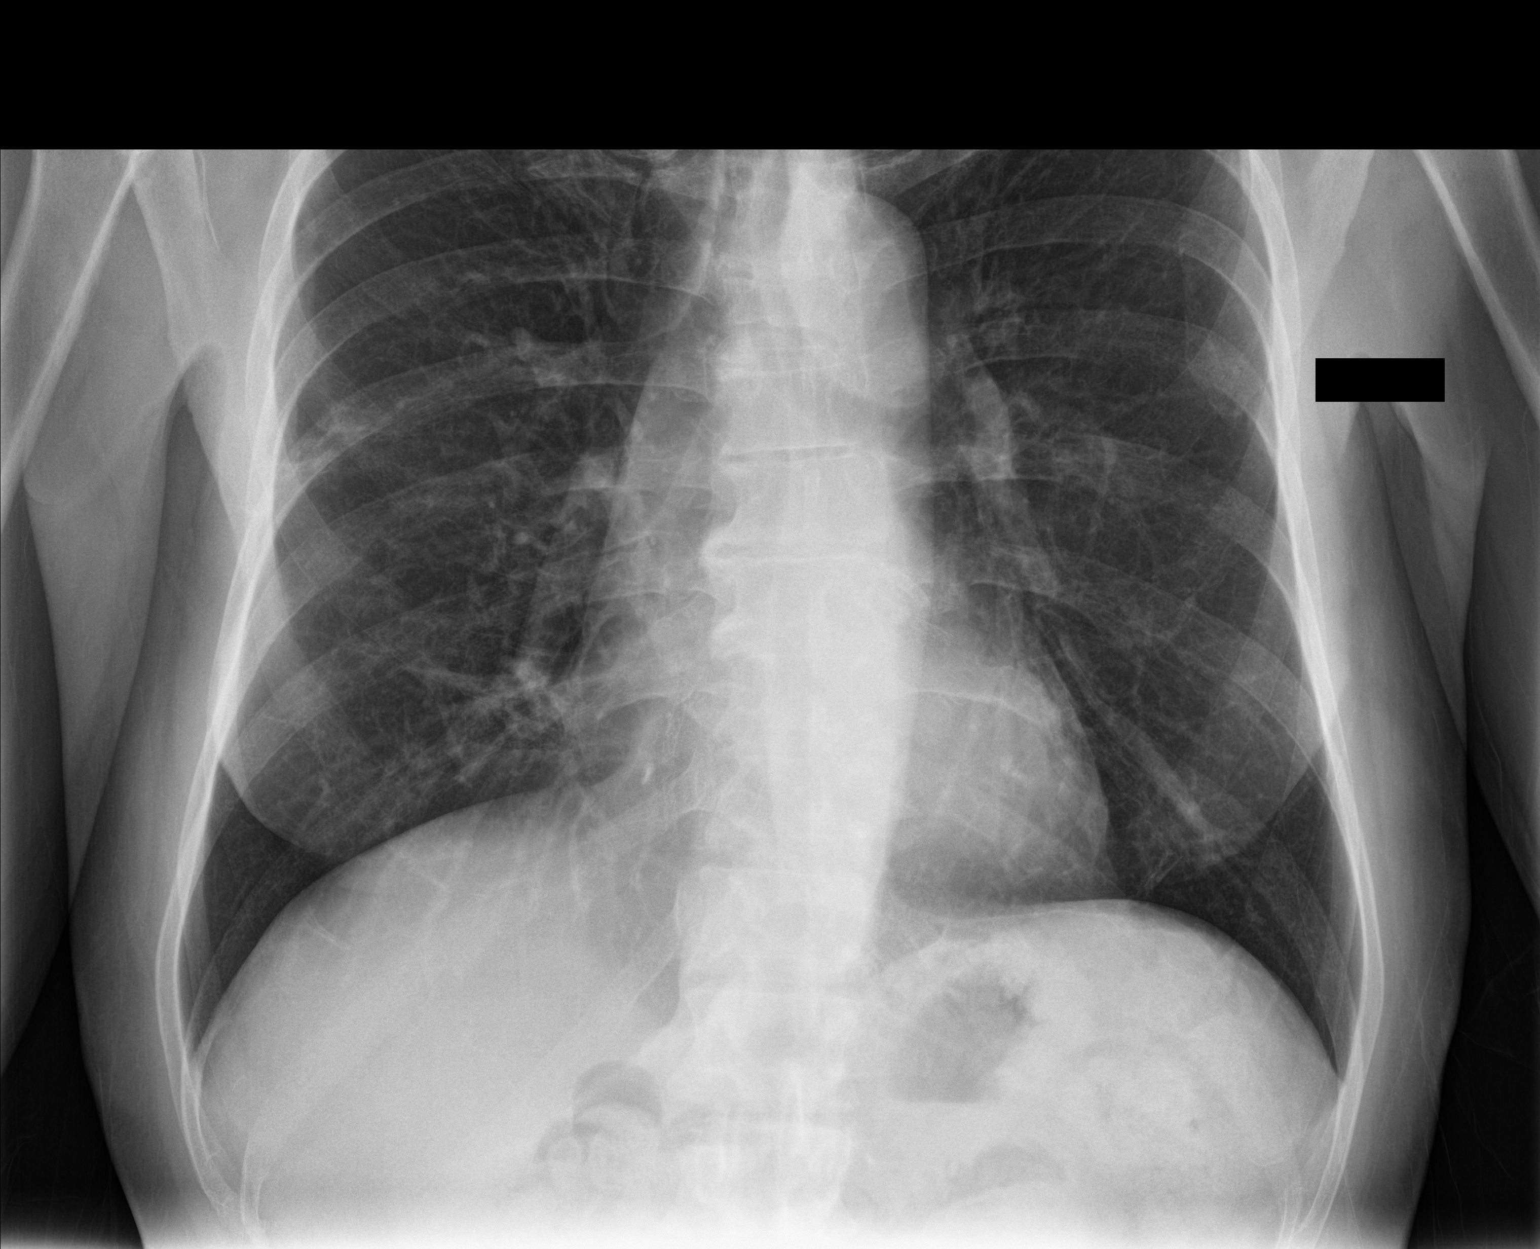

[chest lat]
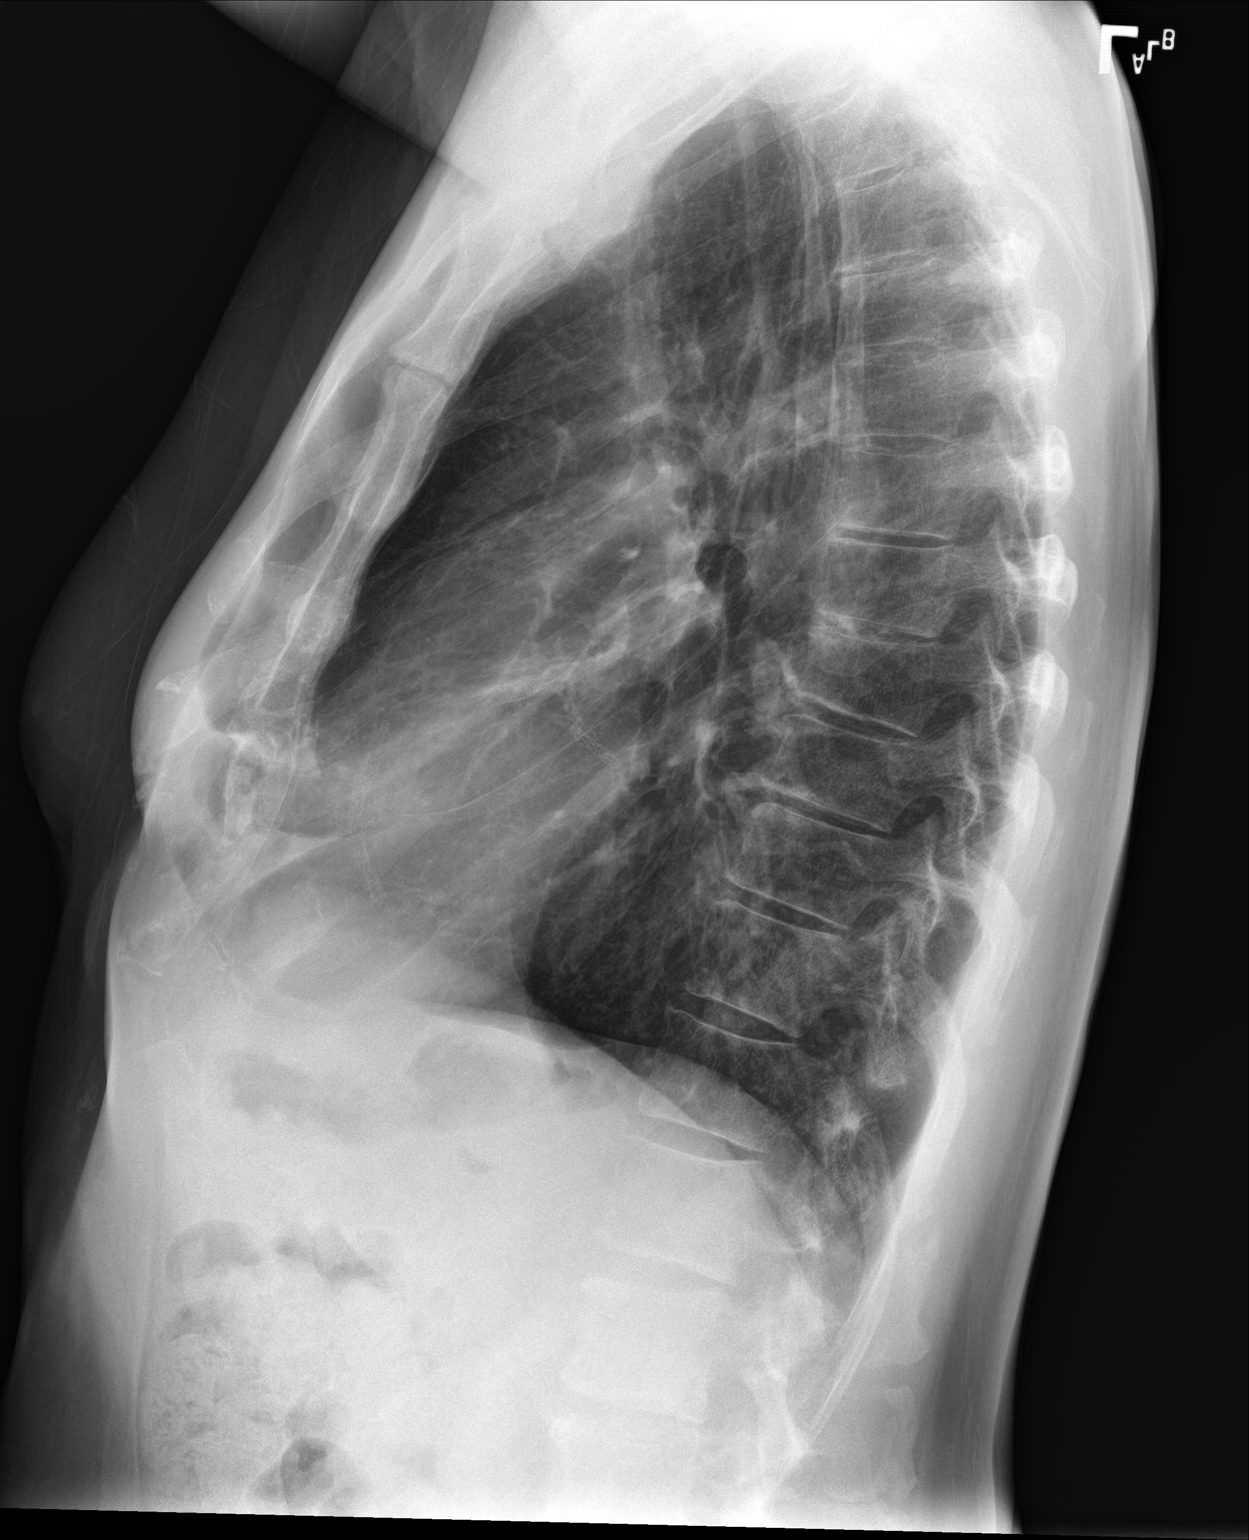

[3 of 3 positions shown; findings below may reference images not displayed]

FINDINGS: Bilateral heterogeneous opacities have markedly improved. There is
some residual opacity in the right midlung zone. Heart is normal in
size. Upper lungs clear. No pneumothorax or pleural effusion.
IMPRESSION: Improved bilateral pneumonia.

## 2017-04-15 DIAGNOSIS — I2510 Atherosclerotic heart disease of native coronary artery without angina pectoris: Secondary | ICD-10-CM | POA: Diagnosis not present

## 2017-04-15 DIAGNOSIS — I10 Essential (primary) hypertension: Secondary | ICD-10-CM | POA: Diagnosis not present

## 2017-04-15 DIAGNOSIS — E782 Mixed hyperlipidemia: Secondary | ICD-10-CM | POA: Diagnosis not present

## 2017-04-15 DIAGNOSIS — Z01818 Encounter for other preprocedural examination: Secondary | ICD-10-CM | POA: Diagnosis not present

## 2017-04-15 DIAGNOSIS — F40240 Claustrophobia: Secondary | ICD-10-CM | POA: Diagnosis not present

## 2017-04-15 DIAGNOSIS — K219 Gastro-esophageal reflux disease without esophagitis: Secondary | ICD-10-CM | POA: Diagnosis not present

## 2017-04-24 DIAGNOSIS — Z7982 Long term (current) use of aspirin: Secondary | ICD-10-CM | POA: Diagnosis not present

## 2017-04-24 DIAGNOSIS — F4310 Post-traumatic stress disorder, unspecified: Secondary | ICD-10-CM | POA: Diagnosis not present

## 2017-04-24 DIAGNOSIS — K219 Gastro-esophageal reflux disease without esophagitis: Secondary | ICD-10-CM | POA: Diagnosis not present

## 2017-04-24 DIAGNOSIS — I10 Essential (primary) hypertension: Secondary | ICD-10-CM | POA: Diagnosis not present

## 2017-04-24 DIAGNOSIS — Z955 Presence of coronary angioplasty implant and graft: Secondary | ICD-10-CM | POA: Diagnosis not present

## 2017-04-24 DIAGNOSIS — H2511 Age-related nuclear cataract, right eye: Secondary | ICD-10-CM | POA: Diagnosis not present

## 2017-04-24 DIAGNOSIS — I252 Old myocardial infarction: Secondary | ICD-10-CM | POA: Diagnosis not present

## 2017-04-24 DIAGNOSIS — I2510 Atherosclerotic heart disease of native coronary artery without angina pectoris: Secondary | ICD-10-CM | POA: Diagnosis not present

## 2017-04-24 DIAGNOSIS — H9190 Unspecified hearing loss, unspecified ear: Secondary | ICD-10-CM | POA: Diagnosis not present

## 2017-05-08 DIAGNOSIS — I2510 Atherosclerotic heart disease of native coronary artery without angina pectoris: Secondary | ICD-10-CM | POA: Diagnosis not present

## 2017-05-08 DIAGNOSIS — H2512 Age-related nuclear cataract, left eye: Secondary | ICD-10-CM | POA: Diagnosis not present

## 2017-05-08 DIAGNOSIS — Z955 Presence of coronary angioplasty implant and graft: Secondary | ICD-10-CM | POA: Diagnosis not present

## 2017-05-08 DIAGNOSIS — H409 Unspecified glaucoma: Secondary | ICD-10-CM | POA: Diagnosis not present

## 2017-05-08 DIAGNOSIS — H401111 Primary open-angle glaucoma, right eye, mild stage: Secondary | ICD-10-CM | POA: Diagnosis not present

## 2017-05-08 DIAGNOSIS — I10 Essential (primary) hypertension: Secondary | ICD-10-CM | POA: Diagnosis not present

## 2017-05-08 DIAGNOSIS — H9190 Unspecified hearing loss, unspecified ear: Secondary | ICD-10-CM | POA: Diagnosis not present

## 2017-05-08 DIAGNOSIS — I252 Old myocardial infarction: Secondary | ICD-10-CM | POA: Diagnosis not present

## 2017-05-08 DIAGNOSIS — H401123 Primary open-angle glaucoma, left eye, severe stage: Secondary | ICD-10-CM | POA: Diagnosis not present

## 2017-05-09 ENCOUNTER — Encounter: Payer: Self-pay | Admitting: Internal Medicine

## 2017-05-09 HISTORY — PX: CATARACT EXTRACTION W/ INTRAOCULAR LENS IMPLANT: SHX1309

## 2017-06-24 ENCOUNTER — Ambulatory Visit: Payer: Medicare PPO | Admitting: Internal Medicine

## 2017-06-24 ENCOUNTER — Encounter: Payer: Self-pay | Admitting: Internal Medicine

## 2017-06-24 VITALS — BP 122/80 | HR 75 | Temp 98.1°F | Ht 71.0 in | Wt 171.0 lb

## 2017-06-24 DIAGNOSIS — J0100 Acute maxillary sinusitis, unspecified: Secondary | ICD-10-CM | POA: Diagnosis not present

## 2017-06-24 DIAGNOSIS — H6692 Otitis media, unspecified, left ear: Secondary | ICD-10-CM

## 2017-06-24 DIAGNOSIS — J9801 Acute bronchospasm: Secondary | ICD-10-CM | POA: Diagnosis not present

## 2017-06-24 MED ORDER — ALBUTEROL SULFATE HFA 108 (90 BASE) MCG/ACT IN AERS: 2.0000 | INHALATION_SPRAY | Freq: Four times a day (QID) | RESPIRATORY_TRACT | 0 refills | Status: DC | PRN

## 2017-06-24 MED ORDER — AMOXICILLIN-POT CLAVULANATE 875-125 MG PO TABS: 1.0000 | ORAL_TABLET | Freq: Two times a day (BID) | ORAL | 0 refills | Status: AC

## 2017-06-24 NOTE — Progress Notes (Signed)
Date:  06/24/2017   Name:  Dominic Mcmillan   DOB:  January 29, 1946   MRN:  [id number]   Chief Complaint: Sinusitis (2-3 weeks. Started out as sinus drainage. Feels like caught this from daughter in law. Chills, body aches. Chest congestion. Cough- no production- stuck in chest.)  Sinusitis  This is a new problem. The current episode started 1 to 4 weeks ago. The problem has been gradually worsening since onset. There has been no fever. He is experiencing no pain. Associated symptoms include chills, congestion, coughing, ear pain, shortness of breath, sinus pressure and a sore throat. Pertinent negatives include no headaches.     Review of Systems  Constitutional: Positive for chills and fatigue. Negative for unexpected weight change.  HENT: Positive for congestion, ear pain, sinus pressure and sore throat. Negative for postnasal drip.   Eyes: Negative for visual disturbance.  Respiratory: Positive for cough, shortness of breath and wheezing. Negative for chest tightness.   Cardiovascular: Negative for chest pain and palpitations.  Gastrointestinal: Negative for abdominal distention.  Neurological: Negative for dizziness and headaches.    Patient Active Problem List   Diagnosis Date Noted  . Primary open angle glaucoma (POAG) of left eye, severe stage 12/18/2016  . Primary open angle glaucoma (POAG) of right eye, mild stage 12/18/2016  . Primary open angle glaucoma of right eye, mild stage 08/14/2016  . Cataract, nuclear sclerotic, both eyes 08/14/2016  . Sepsis (HCC) 06/04/2016  . Angina pectoris (HCC) 09/12/2015  . Thrombocytopenia (HCC) 09/12/2015  . Hyperglycemia 09/12/2015  . Otitis media 03/28/2015  . Familial multiple lipoprotein-type hyperlipidemia 02/13/2015  . Hearing loss 02/13/2015  . Neurosis, posttraumatic 02/13/2015  . Arteriosclerosis of coronary artery 02/13/2015  . Degeneration of intervertebral disc of lumbar region 02/13/2015  . Essential (primary)  hypertension 02/13/2015  . Discoloration of nail 02/13/2015  . GERD (gastroesophageal reflux disease) 03/14/2014  . History of cardiac catheterization 03/11/2014    Prior to Admission medications   Medication Sig Start Date End Date Taking? Authorizing Provider  aspirin (ECOTRIN LOW STRENGTH) 81 MG EC tablet Take 1 tablet by mouth daily.   Yes [provider]  brimonidine (ALPHAGAN) 0.2 % ophthalmic solution Place 1 drop into the left eye 2 (two) times daily.    Yes [provider]  Cholecalciferol (HM VITAMIN D3) 2000 units CAPS Take 1 capsule by mouth daily.    Yes [provider]  clopidogrel (PLAVIX) 75 MG tablet Take 1 tablet by mouth daily.   Yes [provider]  co-enzyme Q-10 30 MG capsule Take 30 mg by mouth 3 (three) times daily.   Yes [provider]  DORZOLAMIDE HCL-TIMOLOL MAL OP Place 1 drop into the left eye 2 (two) times daily.    Yes [provider]  HYDROcodone-acetaminophen (NORCO/VICODIN) 5-325 MG tablet Take 1 tablet by mouth every 6 (six) hours as needed for moderate pain.   Yes [provider]  ketoconazole (NIZORAL) 2 % shampoo KETOCONAZOLE, 2% (External Shampoo) - Historical Medication  (2 %) Active   Yes [provider]  latanoprost (XALATAN) 0.005 % ophthalmic solution Place 1 drop into both eyes at bedtime.    Yes [provider]  Multiple Vitamins-Minerals (CENTRUM SILVER) tablet Take 1 tablet by mouth daily.    Yes [provider]  nitroGLYCERIN (NITROSTAT) 0.4 MG SL tablet Place under the tongue.   Yes [provider]  olmesartan (BENICAR) 20 MG tablet Take 1 tablet by mouth daily.  Yes [provider]  prednisoLONE acetate (PRED MILD) 0.12 % ophthalmic suspension 1 drop 4 (four) times daily.   Yes [provider]  rosuvastatin (CRESTOR) 40 MG tablet Take 20 mg by mouth daily.   Yes [provider]  vitamin C (ASCORBIC ACID) 500 MG  tablet Take 1,000 mg by mouth daily.    Yes [provider]    Allergies  Allergen Reactions  . Nortriptyline Hcl   . Prazosin Hcl     joint pain  . Quetiapine Fumarate   . Tizanidine   . Imdur [Isosorbide Dinitrate] Other (See Comments)    Hypotension    Past Surgical History:  Procedure Laterality Date  . CARDIAC CATHETERIZATION N/A 09/12/2015   Procedure: Left Heart Cath and Coronary Angiography;  Surgeon: Lamar BlinksBruce J Kowalski, MD;  Location: [facility] INVASIVE CV LAB;  Service: Cardiovascular;  Laterality: N/A;  . CATARACT EXTRACTION W/ INTRAOCULAR LENS IMPLANT Left 05/09/2017  . CERVICAL SPINE SURGERY    . COLONOSCOPY  2008  . CORONARY ANGIOPLASTY WITH STENT PLACEMENT    . CORONARY ANGIOPLASTY WITH STENT PLACEMENT    . NASAL SINUS SURGERY    . SALIVARY GLAND SURGERY Right 2011   oncocytoma  . TONSILLECTOMY      Social History   Tobacco Use  . Smoking status: Never Smoker  . Smokeless tobacco: Never Used  Substance Use Topics  . Alcohol use: No    Alcohol/week: 0.0 oz  . Drug use: No     Medication list has been reviewed and updated.  PHQ 2/9 Scores 02/20/2017 08/02/2015  PHQ - 2 Score 0 0    Physical Exam  Constitutional: He is oriented to person, place, and time. He appears well-developed and well-nourished.  HENT:  Right Ear: External ear and ear canal normal. Tympanic membrane is not erythematous and not retracted.  Left Ear: External ear and ear canal normal. Tympanic membrane is erythematous and retracted.  Nose: Right sinus exhibits maxillary sinus tenderness. Right sinus exhibits no frontal sinus tenderness. Left sinus exhibits maxillary sinus tenderness. Left sinus exhibits no frontal sinus tenderness.  Mouth/Throat: Uvula is midline and mucous membranes are normal. No oral lesions. Posterior oropharyngeal erythema present. No oropharyngeal exudate.  Cardiovascular: Normal rate, regular rhythm and normal heart sounds.  Pulmonary/Chest: He has decreased  breath sounds. He has no wheezes. He has no rhonchi. He has no rales.  Lymphadenopathy:    He has no cervical adenopathy.  Neurological: He is alert and oriented to person, place, and time.    BP 122/80   Pulse 75   Temp 98.1 F (36.7 C) (Oral)   Ht 5\' 11"  (1.803 m)   Wt 171 lb (77.6 kg)   SpO2 96%   BMI 23.85 kg/m   Assessment and Plan: 1. Acute maxillary sinusitis, recurrence not specified Continue otc cold medication - amoxicillin-clavulanate (AUGMENTIN) 875-125 MG tablet; Take 1 tablet by mouth 2 (two) times daily for 10 days.  Dispense: 20 tablet; Refill: 0  2. Otitis of left ear - amoxicillin-clavulanate (AUGMENTIN) 875-125 MG tablet; Take 1 tablet by mouth 2 (two) times daily for 10 days.  Dispense: 20 tablet; Refill: 0  3. Cough due to bronchospasm Continue albuterol - albuterol (PROVENTIL HFA;VENTOLIN HFA) 108 (90 Base) MCG/ACT inhaler; Inhale 2 puffs into the lungs every 6 (six) hours as needed for wheezing or shortness of breath.  Dispense: 1 Inhaler; Refill: 0   Meds ordered this encounter  Medications  . albuterol (PROVENTIL HFA;VENTOLIN HFA) 108 (90  Base) MCG/ACT inhaler    Sig: Inhale 2 puffs into the lungs every 6 (six) hours as needed for wheezing or shortness of breath.    Dispense:  1 Inhaler    Refill:  0  . amoxicillin-clavulanate (AUGMENTIN) 875-125 MG tablet    Sig: Take 1 tablet by mouth 2 (two) times daily for 10 days.    Dispense:  20 tablet    Refill:  0    Partially dictated using Animal nutritionist. Any errors are unintentional.  Bari Edward, MD [facility]  06/24/2017

## 2017-08-20 DIAGNOSIS — Z961 Presence of intraocular lens: Secondary | ICD-10-CM | POA: Diagnosis not present

## 2017-08-20 DIAGNOSIS — H401123 Primary open-angle glaucoma, left eye, severe stage: Secondary | ICD-10-CM | POA: Diagnosis not present

## 2017-08-20 DIAGNOSIS — H401111 Primary open-angle glaucoma, right eye, mild stage: Secondary | ICD-10-CM | POA: Diagnosis not present

## 2017-09-16 ENCOUNTER — Ambulatory Visit: Payer: Medicare PPO | Admitting: Internal Medicine

## 2017-09-16 ENCOUNTER — Encounter: Payer: Self-pay | Admitting: Internal Medicine

## 2017-09-16 VITALS — BP 112/68 | HR 64 | Resp 16 | Ht 71.0 in | Wt 171.0 lb

## 2017-09-16 DIAGNOSIS — W5911XA Bitten by nonvenomous snake, initial encounter: Secondary | ICD-10-CM | POA: Diagnosis not present

## 2017-09-16 DIAGNOSIS — M79605 Pain in left leg: Secondary | ICD-10-CM | POA: Diagnosis not present

## 2017-09-16 NOTE — Progress Notes (Signed)
Date:  09/16/2017   Name:  Dominic Mcmillan   DOB:  [birth year]   MRN:  [id number]   Chief Complaint: Animal Bite (Bit Sunday by snake that he did not see.L Leg 1 1/2 cm apart bite markings.  He was moving stuff and had shorts on and felt a sting and thought it was a wasp but he saw the two spots on leg and fit a snake bite and so EMS family member looked at it and verified as wellas a nurse that it was some kind of snake. Did not feel serious pain or nausea so he just wants to be sure no infection risk. L leg)  Bite - bitten 48 hours ago on 09/14/17 while in his home storage shed.  He did not see the snake but by the bite mark that is what is was. It was fairly painful for the rest of that day.  Now it is still slightly uncomfortable but improving.  He has seen no other side effects, no fever, chills, SOB, HA, bleeding.  He has been applying bactroban to the bite site.  There has been some bruising but no drainage or redness at the site.  Review of Systems  Constitutional: Negative for chills, fatigue and fever.  Respiratory: Negative for chest tightness and shortness of breath.   Cardiovascular: Negative for chest pain and palpitations.  Gastrointestinal: Negative for abdominal pain.  Musculoskeletal: Negative for arthralgias, gait problem and joint swelling.  Skin: Positive for wound.  Hematological: Negative for adenopathy. Does not bruise/bleed easily.    Patient Active Problem List   Diagnosis Date Noted  . Primary open angle glaucoma (POAG) of left eye, severe stage 12/18/2016  . Primary open angle glaucoma (POAG) of right eye, mild stage 12/18/2016  . Primary open angle glaucoma of right eye, mild stage 08/14/2016  . Cataract, nuclear sclerotic, both eyes 08/14/2016  . Sepsis (HCC) 06/04/2016  . Angina pectoris (HCC) 09/12/2015  . Thrombocytopenia (HCC) 09/12/2015  . Hyperglycemia 09/12/2015  . Otitis media 03/28/2015  . Familial multiple lipoprotein-type hyperlipidemia  02/13/2015  . Hearing loss 02/13/2015  . Neurosis, posttraumatic 02/13/2015  . Arteriosclerosis of coronary artery 02/13/2015  . Degeneration of intervertebral disc of lumbar region 02/13/2015  . Essential (primary) hypertension 02/13/2015  . Discoloration of nail 02/13/2015  . GERD (gastroesophageal reflux disease) 03/14/2014  . History of cardiac catheterization 03/11/2014    Prior to Admission medications   Medication Sig Start Date End Date Taking? Authorizing Provider  aspirin (ECOTRIN LOW STRENGTH) 81 MG EC tablet Take 1 tablet by mouth daily.   Yes [provider]  Cholecalciferol (HM VITAMIN D3) 2000 units CAPS Take 1 capsule by mouth daily.    Yes [provider]  clopidogrel (PLAVIX) 75 MG tablet Take 1 tablet by mouth daily.   Yes [provider]  co-enzyme Q-10 30 MG capsule Take 30 mg by mouth 3 (three) times daily.   Yes [provider]  DORZOLAMIDE HCL-TIMOLOL MAL OP Place 1 drop into the left eye 2 (two) times daily.    Yes [provider]  HYDROcodone-acetaminophen (NORCO/VICODIN) 5-325 MG tablet Take 1 tablet by mouth every 6 (six) hours as needed for moderate pain.   Yes [provider]  ketoconazole (NIZORAL) 2 % shampoo KETOCONAZOLE, 2% (External Shampoo) - Historical Medication  (2 %) Active   Yes [provider]  latanoprost (XALATAN) 0.005 % ophthalmic solution Place 1 drop into both eyes at bedtime.    Yes  [provider]  Multiple Vitamins-Minerals (CENTRUM SILVER) tablet Take 1 tablet by mouth daily.    Yes [provider]  nitroGLYCERIN (NITROSTAT) 0.4 MG SL tablet Place under the tongue.   Yes [provider]  olmesartan (BENICAR) 20 MG tablet Take 1 tablet by mouth daily.   Yes [provider]  ranitidine (ZANTAC) 150 MG capsule Take 150 mg by mouth 2 (two) times daily.   Yes [provider]  rosuvastatin (CRESTOR) 40 MG tablet Take 20 mg by mouth daily.    Yes [provider]  vitamin C (ASCORBIC ACID) 500 MG tablet Take 1,000 mg by mouth daily.    Yes [provider]    Allergies  Allergen Reactions  . Nortriptyline Hcl   . Prazosin Hcl     joint pain  . Quetiapine Fumarate   . Tizanidine   . Imdur [Isosorbide Dinitrate] Other (See Comments)    Hypotension    Past Surgical History:  Procedure Laterality Date  . CARDIAC CATHETERIZATION N/A 09/12/2015   Procedure: Left Heart Cath and Coronary Angiography;  Surgeon: Lamar Blinks, MD;  Location: [facility] INVASIVE CV LAB;  Service: Cardiovascular;  Laterality: N/A;  . CATARACT EXTRACTION W/ INTRAOCULAR LENS IMPLANT Left 05/09/2017  . CERVICAL SPINE SURGERY    . COLONOSCOPY  2008  . CORONARY ANGIOPLASTY WITH STENT PLACEMENT    . CORONARY ANGIOPLASTY WITH STENT PLACEMENT    . NASAL SINUS SURGERY    . SALIVARY GLAND SURGERY Right 2011   oncocytoma  . TONSILLECTOMY      Social History   Tobacco Use  . Smoking status: Never Smoker  . Smokeless tobacco: Never Used  Substance Use Topics  . Alcohol use: No    Alcohol/week: 0.0 oz  . Drug use: No     Medication list has been reviewed and updated.  PHQ 2/9 Scores 02/20/2017 08/02/2015  PHQ - 2 Score 0 0    Physical Exam  Constitutional: He is oriented to person, place, and time. He appears well-developed. No distress.  HENT:  Head: Normocephalic and atraumatic.  Neck: Normal range of motion. Neck supple.  Cardiovascular: Normal rate, regular rhythm and normal heart sounds.  Pulmonary/Chest: Effort normal and breath sounds normal. No respiratory distress.  Musculoskeletal: Normal range of motion.  Neurological: He is alert and oriented to person, place, and time.  Skin: Skin is warm and dry. No rash noted.     Psychiatric: He has a normal mood and affect. His behavior is normal. Thought content normal.  Nursing note and vitals reviewed.   BP 112/68   Pulse 64   Resp 16   Ht  (1.803 m)   Wt 171  lb (77.6 kg)   SpO2 98%   BMI 23.85 kg/m   Assessment and Plan: 1. Pain of left lower extremity Improving - continue anti-inflammatories as needed  2. Snake bite, initial encounter Local care TDap up to date Call if s/s bacterial infection develop   No orders of the defined types were placed in this encounter.   Partially dictated using Animal nutritionist. Any errors are unintentional.  Bari Edward, MD [facility]  09/16/2017

## 2017-10-03 DIAGNOSIS — H401123 Primary open-angle glaucoma, left eye, severe stage: Secondary | ICD-10-CM | POA: Diagnosis not present

## 2017-10-03 DIAGNOSIS — Z961 Presence of intraocular lens: Secondary | ICD-10-CM | POA: Diagnosis not present

## 2017-10-03 DIAGNOSIS — H401111 Primary open-angle glaucoma, right eye, mild stage: Secondary | ICD-10-CM | POA: Diagnosis not present

## 2017-10-08 DIAGNOSIS — I10 Essential (primary) hypertension: Secondary | ICD-10-CM | POA: Diagnosis not present

## 2017-10-08 DIAGNOSIS — E785 Hyperlipidemia, unspecified: Secondary | ICD-10-CM | POA: Insufficient documentation

## 2017-10-08 DIAGNOSIS — E7800 Pure hypercholesterolemia, unspecified: Secondary | ICD-10-CM | POA: Insufficient documentation

## 2017-10-08 DIAGNOSIS — I2510 Atherosclerotic heart disease of native coronary artery without angina pectoris: Secondary | ICD-10-CM | POA: Diagnosis not present

## 2017-12-23 DIAGNOSIS — L570 Actinic keratosis: Secondary | ICD-10-CM | POA: Diagnosis not present

## 2018-01-02 DIAGNOSIS — H401123 Primary open-angle glaucoma, left eye, severe stage: Secondary | ICD-10-CM | POA: Diagnosis not present

## 2018-01-02 DIAGNOSIS — H401111 Primary open-angle glaucoma, right eye, mild stage: Secondary | ICD-10-CM | POA: Diagnosis not present

## 2018-01-02 DIAGNOSIS — Z961 Presence of intraocular lens: Secondary | ICD-10-CM | POA: Diagnosis not present

## 2018-02-09 ENCOUNTER — Telehealth: Payer: Self-pay | Admitting: Internal Medicine

## 2018-02-09 NOTE — Telephone Encounter (Signed)
Called to resched awv  °

## 2018-02-23 ENCOUNTER — Ambulatory Visit: Payer: Self-pay

## 2018-02-27 ENCOUNTER — Encounter: Payer: Self-pay | Admitting: Internal Medicine

## 2018-02-27 ENCOUNTER — Ambulatory Visit (INDEPENDENT_AMBULATORY_CARE_PROVIDER_SITE_OTHER): Payer: Medicare PPO | Admitting: Internal Medicine

## 2018-02-27 VITALS — BP 120/68 | HR 69 | Temp 98.0°F | Ht 71.0 in | Wt 166.0 lb

## 2018-02-27 DIAGNOSIS — J0100 Acute maxillary sinusitis, unspecified: Secondary | ICD-10-CM | POA: Diagnosis not present

## 2018-02-27 MED ORDER — DOXYCYCLINE HYCLATE 100 MG PO TABS: 100.0000 mg | ORAL_TABLET | Freq: Two times a day (BID) | ORAL | 0 refills | Status: AC

## 2018-02-27 NOTE — Progress Notes (Signed)
Date:  02/27/2018   Name:  Dominic Mcmillan   DOB:  Aug 11, 1945   MRN:  [id number]   Chief Complaint: Sore Throat (Started 2 weeks ago. Sinus drainage, with sore throat. Cough- no production. No fever.)  Sinus Problem  This is a new problem. The current episode started in the past 7 days. The problem has been gradually worsening since onset. There has been no fever. Associated symptoms include chills, congestion, coughing, sinus pressure and a sore throat. Pertinent negatives include no headaches, hoarse voice, shortness of breath or swollen glands.    Review of Systems  Constitutional: Positive for chills.  HENT: Positive for congestion, sinus pressure and sore throat. Negative for hoarse voice.   Eyes: Negative for visual disturbance.  Respiratory: Positive for cough. Negative for chest tightness, shortness of breath and wheezing.   Cardiovascular: Negative for chest pain and palpitations.  Neurological: Negative for headaches.    Patient Active Problem List   Diagnosis Date Noted  . Primary open angle glaucoma (POAG) of left eye, severe stage 12/18/2016  . Primary open angle glaucoma (POAG) of right eye, mild stage 12/18/2016  . Primary open angle glaucoma of right eye, mild stage 08/14/2016  . Cataract, nuclear sclerotic, both eyes 08/14/2016  . Sepsis (HCC) 06/04/2016  . Angina pectoris (HCC) 09/12/2015  . Thrombocytopenia (HCC) 09/12/2015  . Hyperglycemia 09/12/2015  . Otitis media 03/28/2015  . Familial multiple lipoprotein-type hyperlipidemia 02/13/2015  . Hearing loss 02/13/2015  . Neurosis, posttraumatic 02/13/2015  . Arteriosclerosis of coronary artery 02/13/2015  . Degeneration of intervertebral disc of lumbar region 02/13/2015  . Essential (primary) hypertension 02/13/2015  . Discoloration of nail 02/13/2015  . GERD (gastroesophageal reflux disease) 03/14/2014  . History of cardiac catheterization 03/11/2014    Allergies  Allergen Reactions  .  Nortriptyline Hcl   . Prazosin Hcl     joint pain  . Quetiapine Fumarate   . Tizanidine   . Imdur [Isosorbide Dinitrate] Other (See Comments)    Hypotension    Past Surgical History:  Procedure Laterality Date  . CARDIAC CATHETERIZATION N/A 09/12/2015   Procedure: Left Heart Cath and Coronary Angiography;  Surgeon: Lamar Blinks, MD;  Location: [facility] INVASIVE CV LAB;  Service: Cardiovascular;  Laterality: N/A;  . CATARACT EXTRACTION W/ INTRAOCULAR LENS IMPLANT Left 05/09/2017  . CERVICAL SPINE SURGERY    . COLONOSCOPY  2008  . CORONARY ANGIOPLASTY WITH STENT PLACEMENT    . CORONARY ANGIOPLASTY WITH STENT PLACEMENT    . NASAL SINUS SURGERY    . SALIVARY GLAND SURGERY Right 2011   oncocytoma  . TONSILLECTOMY      Social History   Tobacco Use  . Smoking status: Never Smoker  . Smokeless tobacco: Never Used  Substance Use Topics  . Alcohol use: No    Alcohol/week: 0.0 standard drinks  . Drug use: No     Medication list has been reviewed and updated.  Current Meds  Medication Sig  . aspirin (ECOTRIN LOW STRENGTH) 81 MG EC tablet Take 1 tablet by mouth daily.  . Cholecalciferol (HM VITAMIN D3) 2000 units CAPS Take 1 capsule by mouth daily.   . clopidogrel (PLAVIX) 75 MG tablet Take 1 tablet by mouth daily.  Marland Kitchen co-enzyme Q-10 30 MG capsule Take 30 mg by mouth 3 (three) times daily.  . DORZOLAMIDE HCL-TIMOLOL MAL OP Place 1 drop into the left eye 2 (two) times daily.   . famotidine (PEPCID) 10 MG tablet Take 10 mg by mouth  2 (two) times daily.  Marland Kitchen HYDROcodone-acetaminophen (NORCO/VICODIN) 5-325 MG tablet Take 1 tablet by mouth every 6 (six) hours as needed for moderate pain.  Marland Kitchen ketoconazole (NIZORAL) 2 % shampoo KETOCONAZOLE, 2% (External Shampoo) - Historical Medication  (2 %) Active  . latanoprost (XALATAN) 0.005 % ophthalmic solution Place 1 drop into both eyes at bedtime.   . Multiple Vitamins-Minerals (CENTRUM SILVER) tablet Take 1 tablet by mouth daily.   .  nitroGLYCERIN (NITROSTAT) 0.4 MG SL tablet Place under the tongue.  Marland Kitchen olmesartan (BENICAR) 20 MG tablet Take 1 tablet by mouth daily.  . rosuvastatin (CRESTOR) 40 MG tablet Take 20 mg by mouth daily.  . vitamin C (ASCORBIC ACID) 500 MG tablet Take 1,000 mg by mouth daily.     PHQ 2/9 Scores 02/27/2018 02/20/2017 08/02/2015  PHQ - 2 Score 0 0 0    Physical Exam  Constitutional: He is oriented to person, place, and time. He appears well-developed. No distress.  HENT:  Head: Normocephalic and atraumatic.  Right Ear: No tenderness.  Left Ear: No tenderness.  Nose: Right sinus exhibits maxillary sinus tenderness. Left sinus exhibits maxillary sinus tenderness.  Mouth/Throat: Uvula is midline and mucous membranes are normal. Posterior oropharyngeal erythema present. No oropharyngeal exudate or posterior oropharyngeal edema.  Neck: Normal range of motion. Neck supple.  Cardiovascular: Normal rate and regular rhythm.  Pulmonary/Chest: Effort normal and breath sounds normal. No respiratory distress. He has no wheezes. He has no rales.  Musculoskeletal: Normal range of motion.  Lymphadenopathy:    He has no cervical adenopathy.  Neurological: He is alert and oriented to person, place, and time.  Skin: Skin is warm and dry. No rash noted.  Psychiatric: He has a normal mood and affect. His behavior is normal. Thought content normal.  Nursing note and vitals reviewed.   BP 120/68 (BP Location: Right Arm, Patient Position: Sitting, Cuff Size: Normal)   Pulse 69   Temp 98 F (36.7 C) (Oral)   Ht 5\' 11"  (1.803 m)   Wt 166 lb (75.3 kg)   SpO2 96%   BMI 23.15 kg/m   Assessment and Plan: 1. Acute non-recurrent maxillary sinusitis Continue chlortrimeton PRN - doxycycline (VIBRA-TABS) 100 MG tablet; Take 1 tablet (100 mg total) by mouth 2 (two) times daily for 10 days.  Dispense: 20 tablet; Refill: 0   Partially dictated using Animal nutritionist. Any errors are unintentional.  Bari Edward,  MD [facility]  02/27/2018

## 2018-03-31 DIAGNOSIS — L720 Epidermal cyst: Secondary | ICD-10-CM | POA: Diagnosis not present

## 2018-03-31 DIAGNOSIS — Z85828 Personal history of other malignant neoplasm of skin: Secondary | ICD-10-CM | POA: Diagnosis not present

## 2018-03-31 DIAGNOSIS — L578 Other skin changes due to chronic exposure to nonionizing radiation: Secondary | ICD-10-CM | POA: Diagnosis not present

## 2018-03-31 DIAGNOSIS — L570 Actinic keratosis: Secondary | ICD-10-CM | POA: Diagnosis not present

## 2018-03-31 DIAGNOSIS — L821 Other seborrheic keratosis: Secondary | ICD-10-CM | POA: Diagnosis not present

## 2018-03-31 DIAGNOSIS — Z872 Personal history of diseases of the skin and subcutaneous tissue: Secondary | ICD-10-CM | POA: Diagnosis not present

## 2018-03-31 DIAGNOSIS — Z86018 Personal history of other benign neoplasm: Secondary | ICD-10-CM | POA: Diagnosis not present

## 2018-04-06 ENCOUNTER — Ambulatory Visit
Admission: RE | Admit: 2018-04-06 | Discharge: 2018-04-06 | Disposition: A | Discharge status: Home / Self Care | Payer: Medicare PPO | Source: Ambulatory Visit | Attending: Internal Medicine | Admitting: Internal Medicine

## 2018-04-06 ENCOUNTER — Ambulatory Visit: Payer: Medicare PPO | Admitting: Internal Medicine

## 2018-04-06 ENCOUNTER — Encounter: Payer: Self-pay | Admitting: Internal Medicine

## 2018-04-06 ENCOUNTER — Ambulatory Visit
Admission: RE | Admit: 2018-04-06 | Discharge: 2018-04-06 | Disposition: A | Discharge status: Home / Self Care | Payer: Medicare PPO | Attending: Internal Medicine | Admitting: Internal Medicine

## 2018-04-06 VITALS — BP 138/76 | HR 81 | Temp 98.4°F | Ht 71.0 in | Wt 171.0 lb

## 2018-04-06 DIAGNOSIS — J189 Pneumonia, unspecified organism: Secondary | ICD-10-CM | POA: Insufficient documentation

## 2018-04-06 DIAGNOSIS — R6889 Other general symptoms and signs: Secondary | ICD-10-CM | POA: Diagnosis not present

## 2018-04-06 DIAGNOSIS — R05 Cough: Secondary | ICD-10-CM | POA: Diagnosis not present

## 2018-04-06 LAB — POCT INFLUENZA A/B
INFLUENZA A, POC: NEGATIVE
Influenza B, POC: NEGATIVE

## 2018-04-06 MED ORDER — AZITHROMYCIN 250 MG PO TABS: ORAL_TABLET | ORAL | 0 refills | Status: AC

## 2018-04-06 NOTE — Progress Notes (Signed)
Date:  04/06/2018   Name:  Dominic Mcmillan   DOB:  08/04/1945   MRN:  [id number]   Chief Complaint: Sore Throat (Sinus drainage and throat is sore. Losing voice. Green/ bloody production. Chest congestion. Body aches and fever since Sat. 100.6 that morning.  )  HPI  Review of Systems  Constitutional: Positive for chills, fatigue and fever. Negative for diaphoresis.  HENT: Positive for sinus pressure and sore throat.   Eyes: Negative for visual disturbance.  Respiratory: Positive for wheezing. Negative for chest tightness and shortness of breath.   Cardiovascular: Negative for chest pain, palpitations and leg swelling.  Genitourinary: Negative for enuresis and hematuria.  Musculoskeletal: Positive for myalgias.  Neurological: Positive for headaches. Negative for dizziness.  Psychiatric/Behavioral: Negative for dysphoric mood and sleep disturbance.    Patient Active Problem List   Diagnosis Date Noted  . Primary open angle glaucoma (POAG) of left eye, severe stage 12/18/2016  . Primary open angle glaucoma (POAG) of right eye, mild stage 12/18/2016  . Primary open angle glaucoma of right eye, mild stage 08/14/2016  . Cataract, nuclear sclerotic, both eyes 08/14/2016  . Sepsis (HCC) 06/04/2016  . Angina pectoris (HCC) 09/12/2015  . Thrombocytopenia (HCC) 09/12/2015  . Hyperglycemia 09/12/2015  . Otitis media 03/28/2015  . Familial multiple lipoprotein-type hyperlipidemia 02/13/2015  . Hearing loss 02/13/2015  . Neurosis, posttraumatic 02/13/2015  . Arteriosclerosis of coronary artery 02/13/2015  . Degeneration of intervertebral disc of lumbar region 02/13/2015  . Essential (primary) hypertension 02/13/2015  . Discoloration of nail 02/13/2015  . GERD (gastroesophageal reflux disease) 03/14/2014  . History of cardiac catheterization 03/11/2014    Allergies  Allergen Reactions  . Nortriptyline Hcl   . Prazosin Hcl     joint pain  . Quetiapine Fumarate   . Tizanidine    . Imdur [Isosorbide Dinitrate] Other (See Comments)    Hypotension    Past Surgical History:  Procedure Laterality Date  . CARDIAC CATHETERIZATION N/A 09/12/2015   Procedure: Left Heart Cath and Coronary Angiography;  Surgeon: Lamar Blinks, MD;  Location: [facility] INVASIVE CV LAB;  Service: Cardiovascular;  Laterality: N/A;  . CATARACT EXTRACTION W/ INTRAOCULAR LENS IMPLANT Left 05/09/2017  . CERVICAL SPINE SURGERY    . COLONOSCOPY  2008  . CORONARY ANGIOPLASTY WITH STENT PLACEMENT    . CORONARY ANGIOPLASTY WITH STENT PLACEMENT    . NASAL SINUS SURGERY    . SALIVARY GLAND SURGERY Right 2011   oncocytoma  . TONSILLECTOMY      Social History   Tobacco Use  . Smoking status: Never Smoker  . Smokeless tobacco: Never Used  Substance Use Topics  . Alcohol use: No    Alcohol/week: 0.0 standard drinks  . Drug use: No     Medication list has been reviewed and updated.  Current Meds  Medication Sig  . aspirin (ECOTRIN LOW STRENGTH) 81 MG EC tablet Take 1 tablet by mouth daily.  . Cholecalciferol (HM VITAMIN D3) 2000 units CAPS Take 1 capsule by mouth daily.   . clopidogrel (PLAVIX) 75 MG tablet Take 1 tablet by mouth daily.  Marland Kitchen co-enzyme Q-10 30 MG capsule Take 30 mg by mouth 3 (three) times daily.  . DORZOLAMIDE HCL-TIMOLOL MAL OP Place 1 drop into the left eye 2 (two) times daily.   . famotidine (PEPCID) 10 MG tablet Take 10 mg by mouth 2 (two) times daily.  Marland Kitchen HYDROcodone-acetaminophen (NORCO/VICODIN) 5-325 MG tablet Take 1 tablet by mouth every 6 (six) hours  as needed for moderate pain.  Marland Kitchen. ketoconazole (NIZORAL) 2 % shampoo KETOCONAZOLE, 2% (External Shampoo) - Historical Medication  (2 %) Active  . latanoprost (XALATAN) 0.005 % ophthalmic solution Place 1 drop into both eyes at bedtime.   . Multiple Vitamins-Minerals (CENTRUM SILVER) tablet Take 1 tablet by mouth daily.   . nitroGLYCERIN (NITROSTAT) 0.4 MG SL tablet Place under the tongue.  Marland Kitchen. olmesartan (BENICAR) 20 MG  tablet Take 1 tablet by mouth daily.  . rosuvastatin (CRESTOR) 40 MG tablet Take 20 mg by mouth daily.  . vitamin C (ASCORBIC ACID) 500 MG tablet Take 1,000 mg by mouth daily.     PHQ 2/9 Scores 02/27/2018 02/20/2017 08/02/2015  PHQ - 2 Score 0 0 0    Physical Exam  Constitutional: He is oriented to person, place, and time. He appears well-developed. He appears ill. No distress.  HENT:  Head: Normocephalic and atraumatic.  Mouth/Throat: Uvula is midline and oropharynx is clear and moist. No tonsillar exudate.  Neck: Normal range of motion. Neck supple.  Cardiovascular: Normal rate and regular rhythm.  Pulmonary/Chest: Effort normal. No accessory muscle usage. No respiratory distress. He has decreased breath sounds. He has rhonchi in the right upper field and the left lower field.  Musculoskeletal: Normal range of motion.  Lymphadenopathy:    He has no cervical adenopathy.  Neurological: He is alert and oriented to person, place, and time.  Skin: Skin is warm and dry. No rash noted.  Psychiatric: He has a normal mood and affect. His behavior is normal. Thought content normal.  Nursing note and vitals reviewed.   BP 138/76 (BP Location: Left Arm, Patient Position: Sitting, Cuff Size: Normal)   Pulse 81   Temp 98.4 F (36.9 C) (Oral)   Ht 5\' 11"  (1.803 m)   Wt 171 lb (77.6 kg)   SpO2 94%   BMI 23.85 kg/m   Assessment and Plan: 1. Flu-like symptoms Test negative - POCT Influenza A/B  2. Community acquired pneumonia, unspecified laterality zpak back to back Tylenol 1000 mg every 8 hours Fluids, rest - azithromycin (ZITHROMAX Z-PAK) 250 MG tablet; UAD - take in succession x 11 days  Dispense: 12 each; Refill: 0 - DG Chest 2 View; Future   Partially dictated using Animal nutritionistDragon software. Any errors are unintentional.  Bari EdwardLaura Marja Adderley, MD [facility]  04/06/2018

## 2018-04-07 ENCOUNTER — Other Ambulatory Visit: Payer: Self-pay | Admitting: Internal Medicine

## 2018-04-07 DIAGNOSIS — R911 Solitary pulmonary nodule: Secondary | ICD-10-CM

## 2018-04-13 ENCOUNTER — Ambulatory Visit (INDEPENDENT_AMBULATORY_CARE_PROVIDER_SITE_OTHER): Payer: Medicare PPO

## 2018-04-13 VITALS — BP 112/68 | HR 64 | Temp 97.9°F | Resp 16 | Ht 71.0 in | Wt 165.0 lb

## 2018-04-13 DIAGNOSIS — Z0000 Encounter for general adult medical examination without abnormal findings: Secondary | ICD-10-CM

## 2018-04-13 NOTE — Progress Notes (Signed)
Subjective:   Dominic Mcmillan is a 72 y.o. male who presents for Medicare Annual/Subsequent preventive examination.  Review of Systems:   Cardiac Risk Factors include: male gender;hypertension, hyperlipidemia, age male > 67     Objective:    Vitals: BP 112/68 (BP Location: Left Arm, Patient Position: Sitting, Cuff Size: Normal)   Pulse 64   Temp 97.9 F (36.6 C) (Oral)   Resp 16   Ht 5\' 11"  (1.803 m)   Wt 165 lb (74.8 kg)   BMI 23.01 kg/m   Body mass index is 23.01 kg/m.  Advanced Directives 04/13/2018 02/20/2017 06/04/2016 09/12/2015 09/11/2015 08/02/2015  Does Patient Have a Medical Advance Directive? No No No No No No  Would patient like information on creating a medical advance directive? No - Patient declined No - Patient declined No - Patient declined No - patient declined information No - patient declined information No - patient declined information    Tobacco Social History   Tobacco Use  Smoking Status Never Smoker  Smokeless Tobacco Never Used     Counseling given: Not Answered   Clinical Intake:  Pre-visit preparation completed: Yes  Pain : 0-10 Pain Score: 2  Pain Type: Chronic pain Pain Location: Neck Pain Orientation: Left Pain Descriptors / Indicators: Aching Pain Onset: More than a month ago Pain Frequency: Constant     Nutritional Status: BMI of 19-24  Normal Nutritional Risks: None Diabetes: No  How often do you need to have someone help you when you read instructions, pamphlets, or other written materials from your doctor or pharmacy?: 1 - Never What is the last grade level you completed in school?: some college  Interpreter Needed?: No  Information entered by :: Reather Littler LPN  Past Medical History:  Diagnosis Date  . Acute MI (HCC)   . CAD (coronary artery disease)   . Glaucoma   . HLD (hyperlipidemia)   . HTN (hypertension)   . PTSD (post-traumatic stress disorder)    Past Surgical History:  Procedure Laterality Date   . CARDIAC CATHETERIZATION N/A 09/12/2015   Procedure: Left Heart Cath and Coronary Angiography;  Surgeon: Lamar Blinks, MD;  Location: [facility] INVASIVE CV LAB;  Service: Cardiovascular;  Laterality: N/A;  . CATARACT EXTRACTION W/ INTRAOCULAR LENS IMPLANT Left 05/09/2017  . CERVICAL SPINE SURGERY    . COLONOSCOPY  2008  . CORONARY ANGIOPLASTY WITH STENT PLACEMENT    . CORONARY ANGIOPLASTY WITH STENT PLACEMENT    . NASAL SINUS SURGERY    . SALIVARY GLAND SURGERY Right 2011   oncocytoma  . TONSILLECTOMY     Family History  Problem Relation Age of Onset  . Hypertension Mother   . Stroke Mother   . Congestive Heart Failure Father    Social History   Socioeconomic History  . Marital status: Married    Spouse name: Not on file  . Number of children: 2  . Years of education: Not on file  . Highest education level: Some college, no degree  Occupational History  . Occupation: retired  Engineer, production  . Financial resource strain: Not hard at all  . Food insecurity:    Worry: Never true    Inability: Never true  . Transportation needs:    Medical: No    Non-medical: No  Tobacco Use  . Smoking status: Never Smoker  . Smokeless tobacco: Never Used  Substance and Sexual Activity  . Alcohol use: No    Alcohol/week: 0.0 standard drinks  . Drug  use: No  . Sexual activity: Not on file  Lifestyle  . Physical activity:    Days per week: 0 days    Minutes per session: 0 min  . Stress: Not at all  Relationships  . Social connections:    Talks on phone: More than three times a week    Gets together: More than three times a week    Attends religious service: Not on file    Active member of club or organization: No    Attends meetings of clubs or organizations: Never    Relationship status: Married  Other Topics Concern  . Not on file  Social History Narrative   Lives at home with wife, independent at baseline    Outpatient Encounter Medications as of 04/13/2018  Medication Sig    . aspirin (ECOTRIN LOW STRENGTH) 81 MG EC tablet Take 1 tablet by mouth daily.  Marland Kitchen. azithromycin (ZITHROMAX Z-PAK) 250 MG tablet UAD - take in succession x 11 days  . Cholecalciferol (HM VITAMIN D3) 2000 units CAPS Take 1 capsule by mouth daily.   . clopidogrel (PLAVIX) 75 MG tablet Take 1 tablet by mouth daily.  Marland Kitchen. co-enzyme Q-10 30 MG capsule Take 30 mg by mouth 3 (three) times daily.  . DORZOLAMIDE HCL-TIMOLOL MAL OP Place 1 drop into the left eye 2 (two) times daily.   . famotidine (PEPCID) 10 MG tablet Take 10 mg by mouth 2 (two) times daily.  Marland Kitchen. HYDROcodone-acetaminophen (NORCO/VICODIN) 5-325 MG tablet Take 1 tablet by mouth every 6 (six) hours as needed for moderate pain.  Marland Kitchen. ketoconazole (NIZORAL) 2 % shampoo KETOCONAZOLE, 2% (External Shampoo) - Historical Medication  (2 %) Active  . latanoprost (XALATAN) 0.005 % ophthalmic solution Place 1 drop into both eyes at bedtime.   . Multiple Vitamins-Minerals (CENTRUM SILVER) tablet Take 1 tablet by mouth daily.   . nitroGLYCERIN (NITROSTAT) 0.4 MG SL tablet Place under the tongue.  Marland Kitchen. olmesartan (BENICAR) 20 MG tablet Take 1 tablet by mouth daily.  . rosuvastatin (CRESTOR) 40 MG tablet Take 20 mg by mouth daily.  . vitamin C (ASCORBIC ACID) 500 MG tablet Take 1,000 mg by mouth daily.    No facility-administered encounter medications on file as of 04/13/2018.     Activities of Daily Living In your present state of health, do you have any difficulty performing the following activities: 04/13/2018 02/27/2018  Hearing? Y N  Comment pt wears hearing aids -  Vision? N N  Comment wears reading glasses -  Difficulty concentrating or making decisions? N N  Walking or climbing stairs? N N  Dressing or bathing? N N  Doing errands, shopping? N N  Preparing Food and eating ? N -  Using the Toilet? N -  In the past six months, have you accidently leaked urine? N -  Do you have problems with loss of bowel control? N -  Managing your Medications? N  -  Managing your Finances? N -  Housekeeping or managing your Housekeeping? N -  Some recent data might be hidden    Patient Care Team: Reubin MilanBerglund, Laura H, MD as PCP - General (Internal Medicine) [facility] (Internal Medicine)   Assessment:   This is a routine wellness examination for Dominic Mcmillan.  Exercise Activities and Dietary recommendations Current Exercise Habits: Home exercise routine(pt is active at home and in the yard), Exercise limited by: None identified  Goals    . Exercise 150 minutes per week (moderate activity)     Continue  to remain active       Fall Risk Fall Risk  04/13/2018 02/27/2018 02/20/2017 08/02/2015  Falls in the past year? 0 0 No No  Number falls in past yr: 0 0 - -  Injury with Fall? - 0 - -   FALL RISK PREVENTION PERTAINING TO THE HOME:  Any stairs in or around the home WITH handrails? Yes  Home free of loose throw rugs in walkways, pet beds, electrical cords, etc? Yes  Adequate lighting in your home to reduce risk of falls? Yes   ASSISTIVE DEVICES UTILIZED TO PREVENT FALLS:  Life alert? No  Use of a cane, walker or w/c? No  Grab bars in the bathroom? No  Shower chair or bench in shower? Yes  Elevated toilet seat or a handicapped toilet? No   DME ORDERS:  DME order needed?  No   TIMED UP AND GO:  Was the test performed? Yes .  Length of time to ambulate 10 feet: 5 sec.   GAIT:  Appearance of gait: Gait stead-fast and without the use of an assistive device.   Education: Fall risk prevention has been discussed.  Intervention(s) required? No   Depression Screen PHQ 2/9 Scores 04/13/2018 02/27/2018 02/20/2017 08/02/2015  PHQ - 2 Score 0 0 0 0    Cognitive Function pt declined 6CIT        Immunization History  Administered Date(s) Administered  . Influenza, High Dose Seasonal PF 12/26/2016  . Influenza-Unspecified 12/28/2013, 12/11/2016, 12/22/2017  . Pneumococcal Polysaccharide-23 09/27/2008  . Tdap 03/30/2011  . Zoster  04/30/2008  . Zoster Recombinat (Shingrix) 07/30/2016, 12/30/2016    Qualifies for Shingles Vaccine? Yes Shingrix series completed 2019  Tdap: Up to date  Flu Vaccine: Up to date  Pneumococcal Vaccine: Up to date   Screening Tests Health Maintenance  Topic Date Due  . Hepatitis C Screening  09/04/1945  . COLONOSCOPY  10/01/1993  . TETANUS/TDAP  03/29/2021  . INFLUENZA VACCINE  Completed  . PNA vac Low Risk Adult  Completed   Cancer Screenings:  Colorectal Screening: Completed 2008 per pt. Repeat every 10 years; [facility] [place] [facility].  Lung Cancer Screening: (Low Dose CT Chest recommended if Age 24-80 years, 30 pack-year currently smoking OR have quit w/in 15years.) does not qualify.    Additional Screening:  Hepatitis C Screening: does qualify; Completed at [facility] per pt.   Vision Screening: Recommended annual ophthalmology exams for early detection of glaucoma and other disorders of the eye. Is the patient up to date with their annual eye exam?  Yes  Who is the provider or what is the name of the office in which the pt attends annual eye exams? Dr. Geryl Councilman   Dental Screening: Recommended annual dental exams for proper oral hygiene  Community Resource Referral:  CRR required this visit?  No      Plan:    I have personally reviewed and addressed the Medicare Annual Wellness questionnaire and have noted the following in the patient's chart:  A. Medical and social history B. Use of alcohol, tobacco or illicit drugs  C. Current medications and supplements D. Functional ability and status E.  Nutritional status F.  Physical activity G. Advance directives H. List of other physicians I.  Hospitalizations, surgeries, and ER visits in previous 12 months J.  Vitals K. Screenings such as hearing and vision if needed, cognitive and depression L. Referrals and appointments   In addition, I have reviewed and  discussed with  patient certain preventive protocols, quality metrics, and best practice recommendations. A written personalized care plan for preventive services as well as general preventive health recommendations were provided to patient.   Signed,  Reather Littler, LPN Nurse Health Advisor   Nurse Notes:pt doing well and appreciative of visit today.

## 2018-04-13 NOTE — Patient Instructions (Signed)
[name] , Thank you for taking time to come for your Medicare Wellness Visit. I appreciate your ongoing commitment to your health goals. Please review the following plan we discussed and let me know if I can assist you in the future.   Screening recommendations/referrals: Colonoscopy: done 2008 - [facility] Recommended yearly ophthalmology/optometry visit for glaucoma screening and checkup Recommended yearly dental visit for hygiene and checkup  Vaccinations: Influenza vaccine: done 12/25/17 Pneumococcal vaccine: done 2017 Tdap vaccine: done 2012 Shingles vaccine: Shingrix series completed 2019    Advanced directives: Advance directive discussed with you today. Even though you declined this today please call our office should you change your mind and we can give you the proper paperwork for you to fill out.  Conditions/risks identified: Keep up the great work!  Next appointment: Please follow up in one year for your Medicare Annual Wellness visit.   Preventive Care 2665 Years and Older, Male Preventive care refers to lifestyle choices and visits with your health care provider that can promote health and wellness. What does preventive care include?  A yearly physical exam. This is also called an annual well check.  Dental exams once or twice a year.  Routine eye exams. Ask your health care provider how often you should have your eyes checked.  Personal lifestyle choices, including:  Daily care of your teeth and gums.  Regular physical activity.  Eating a healthy diet.  Avoiding tobacco and drug use.  Limiting alcohol use.  Practicing safe sex.  Taking low doses of aspirin every day.  Taking vitamin and mineral supplements as recommended by your health care provider. What happens during an annual well check? The services and screenings done by your health care provider during your annual well check will depend on your age,  overall health, lifestyle risk factors, and family history of disease. Counseling  Your health care provider may ask you questions about your:  Alcohol use.  Tobacco use.  Drug use.  Emotional well-being.  Home and relationship well-being.  Sexual activity.  Eating habits.  History of falls.  Memory and ability to understand (cognition).  Work and work Astron[employer]. Screening  You may have the following tests or measurements:  Height, weight, and BMI.  Blood pressure.  Lipid and cholesterol levels. These may be checked every 5 years, or more frequently if you are over 74 years old.  Skin check.  Lung cancer screening. You may have this screening every year starting at age 74 if you have a 30-pack-year history of smoking and currently smoke or have quit within the past 15 years.  Fecal occult blood test (FOBT) of the stool. You may have this test every year starting at age 74.  Flexible sigmoidoscopy or colonoscopy. You may have a sigmoidoscopy every 5 years or a colonoscopy every 10 years starting at age 74.  Prostate cancer screening. Recommendations will vary depending on your family history and other risks.  Hepatitis C blood test.  Hepatitis B blood test.  Sexually transmitted disease (STD) testing.  Diabetes screening. This is done by checking your blood sugar (glucose) after you have not eaten for a while (fasting). You may have this done every 1-3 years.  Abdominal aortic aneurysm (AAA) screening. You may need this if you are a current or former smoker.  Osteoporosis. You may be screened starting at age 74 if you are at high risk. Talk with your health care provider about your test results, treatment options,  and if necessary, the need for more tests. Vaccines  Your health care provider may recommend certain vaccines, such as:  Influenza vaccine. This is recommended every year.  Tetanus, diphtheria, and acellular pertussis (Tdap, Td) vaccine. You may  need a Td booster every 10 years.  Zoster vaccine. You may need this after age 74.  Pneumococcal 13-valent conjugate (PCV13) vaccine. One dose is recommended after age 101.  Pneumococcal polysaccharide (PPSV23) vaccine. One dose is recommended after age 40. Talk to your health care provider about which screenings and vaccines you need and how often you need them. This information is not intended to replace advice given to you by your health care provider. Make sure you discuss any questions you have with your health care provider. Document Released: 05/12/2015 Document Revised: 01/03/2016 Document Reviewed: 02/14/2015 Elsevier Interactive Patient Education  2017 [place] Prevention in the Home Falls can cause injuries. They can happen to people of all ages. There are many things you can do to make your home safe and to help prevent falls. What can I do on the outside of my home?  Regularly fix the edges of walkways and driveways and fix any cracks.  Remove anything that might make you trip as you walk through a door, such as a raised step or threshold.  Trim any bushes or trees on the path to your home.  Use bright outdoor lighting.  Clear any walking paths of anything that might make someone trip, such as rocks or tools.  Regularly check to see if handrails are loose or broken. Make sure that both sides of any steps have handrails.  Any raised decks and porches should have guardrails on the edges.  Have any leaves, snow, or ice cleared regularly.  Use sand or salt on walking paths during winter.  Clean up any spills in your garage right away. This includes oil or grease spills. What can I do in the bathroom?  Use night lights.  Install grab bars by the toilet and in the tub and shower. Do not use towel bars as grab bars.  Use non-skid mats or decals in the tub or shower.  If you need to sit down in the shower, use a plastic, non-slip stool.  Keep the floor  dry. Clean up any water that spills on the floor as soon as it happens.  Remove soap buildup in the tub or shower regularly.  Attach bath mats securely with double-sided non-slip rug tape.  Do not have throw rugs and other things on the floor that can make you trip. What can I do in the bedroom?  Use night lights.  Make sure that you have a light by your bed that is easy to reach.  Do not use any sheets or blankets that are too big for your bed. They should not hang down onto the floor.  Have a firm chair that has side arms. You can use this for support while you get dressed.  Do not have throw rugs and other things on the floor that can make you trip. What can I do in the kitchen?  Clean up any spills right away.  Avoid walking on wet floors.  Keep items that you use a lot in easy-to-reach places.  If you need to reach something above you, use a strong step stool that has a grab bar.  Keep electrical cords out of the way.  Do not use floor polish or wax that makes floors slippery. If  you must use wax, use non-skid floor wax.  Do not have throw rugs and other things on the floor that can make you trip. What can I do with my stairs?  Do not leave any items on the stairs.  Make sure that there are handrails on both sides of the stairs and use them. Fix handrails that are broken or loose. Make sure that handrails are as long as the stairways.  Check any carpeting to make sure that it is firmly attached to the stairs. Fix any carpet that is loose or worn.  Avoid having throw rugs at the top or bottom of the stairs. If you do have throw rugs, attach them to the floor with carpet tape.  Make sure that you have a light switch at the top of the stairs and the bottom of the stairs. If you do not have them, ask someone to add them for you. What else can I do to help prevent falls?  Wear shoes that:  Do not have high heels.  Have rubber bottoms.  Are comfortable and fit you  well.  Are closed at the toe. Do not wear sandals.  If you use a stepladder:  Make sure that it is fully opened. Do not climb a closed stepladder.  Make sure that both sides of the stepladder are locked into place.  Ask someone to hold it for you, if possible.  Clearly mark and make sure that you can see:  Any grab bars or handrails.  First and last steps.  Where the edge of each step is.  Use tools that help you move around (mobility aids) if they are needed. These include:  Canes.  Walkers.  Scooters.  Crutches.  Turn on the lights when you go into a dark area. Replace any light bulbs as soon as they burn out.  Set up your furniture so you have a clear path. Avoid moving your furniture around.  If any of your floors are uneven, fix them.  If there are any pets around you, be aware of where they are.  Review your medicines with your doctor. Some medicines can make you feel dizzy. This can increase your chance of falling. Ask your doctor what other things that you can do to help prevent falls. This information is not intended to replace advice given to you by your health care provider. Make sure you discuss any questions you have with your health care provider. Document Released: 02/09/2009 Document Revised: 09/21/2015 Document Reviewed: 05/20/2014 Elsevier Interactive Patient Education  2017 [employer].

## 2018-04-17 DIAGNOSIS — Z961 Presence of intraocular lens: Secondary | ICD-10-CM | POA: Diagnosis not present

## 2018-04-17 DIAGNOSIS — H401111 Primary open-angle glaucoma, right eye, mild stage: Secondary | ICD-10-CM | POA: Diagnosis not present

## 2018-04-17 DIAGNOSIS — H401123 Primary open-angle glaucoma, left eye, severe stage: Secondary | ICD-10-CM | POA: Diagnosis not present

## 2018-04-17 DIAGNOSIS — H26493 Other secondary cataract, bilateral: Secondary | ICD-10-CM | POA: Diagnosis not present

## 2018-04-28 ENCOUNTER — Ambulatory Visit
Admission: RE | Admit: 2018-04-28 | Discharge: 2018-04-28 | Disposition: A | Discharge status: Home / Self Care | Payer: Medicare PPO | Source: Ambulatory Visit | Attending: Internal Medicine | Admitting: Internal Medicine

## 2018-04-28 DIAGNOSIS — R911 Solitary pulmonary nodule: Secondary | ICD-10-CM | POA: Insufficient documentation

## 2018-04-28 DIAGNOSIS — R918 Other nonspecific abnormal finding of lung field: Secondary | ICD-10-CM | POA: Diagnosis not present

## 2018-04-30 ENCOUNTER — Other Ambulatory Visit: Payer: Self-pay | Admitting: Internal Medicine

## 2018-04-30 DIAGNOSIS — R918 Other nonspecific abnormal finding of lung field: Secondary | ICD-10-CM | POA: Insufficient documentation

## 2018-05-05 ENCOUNTER — Other Ambulatory Visit
Admission: RE | Admit: 2018-05-05 | Discharge: 2018-05-05 | Disposition: A | Discharge status: Home / Self Care | Payer: Medicare PPO | Attending: Pulmonary Disease | Admitting: Pulmonary Disease

## 2018-05-05 ENCOUNTER — Ambulatory Visit: Payer: Medicare PPO | Admitting: Pulmonary Disease

## 2018-05-05 ENCOUNTER — Encounter: Payer: Self-pay | Admitting: Pulmonary Disease

## 2018-05-05 VITALS — BP 140/88 | HR 65 | Ht 71.0 in | Wt 167.8 lb

## 2018-05-05 DIAGNOSIS — R918 Other nonspecific abnormal finding of lung field: Secondary | ICD-10-CM | POA: Diagnosis not present

## 2018-05-05 DIAGNOSIS — R05 Cough: Secondary | ICD-10-CM | POA: Diagnosis not present

## 2018-05-05 DIAGNOSIS — J8410 Pulmonary fibrosis, unspecified: Secondary | ICD-10-CM

## 2018-05-05 DIAGNOSIS — R0609 Other forms of dyspnea: Secondary | ICD-10-CM

## 2018-05-05 DIAGNOSIS — R053 Chronic cough: Secondary | ICD-10-CM

## 2018-05-05 MED ORDER — MOMETASONE FURO-FORMOTEROL FUM 200-5 MCG/ACT IN AERO: 2.0000 | INHALATION_SPRAY | Freq: Two times a day (BID) | RESPIRATORY_TRACT | 0 refills | Status: DC

## 2018-05-05 NOTE — Progress Notes (Signed)
Patient seen in the office today and instructed on use of Dulera.  Patient expressed understanding and demonstrated technique.

## 2018-05-05 NOTE — Patient Instructions (Addendum)
Test today: QuantiFERON gold (to test for prior tuberculosis exposure)  Trial of Dulera inhaler, 2 actuations twice a day.  Rinse mouth after use.  Sample provided.  Use at the sample and then cycle off of the medication.  We will determine at next visit whether this is a medication to be resumed and continued  Follow-up in 4 weeks with PFTs (lung function test) prior to that visit

## 2018-05-07 LAB — QUANTIFERON-TB GOLD PLUS: QUANTIFERON-TB GOLD PLUS: NEGATIVE

## 2018-05-07 LAB — QUANTIFERON-TB GOLD PLUS (RQFGPL)
QuantiFERON Mitogen Value: >10 IU/mL
QuantiFERON Nil Value: 0.03 IU/mL
QuantiFERON TB1 Ag Value: 0.04 IU/mL
QuantiFERON TB2 Ag Value: 0.04 IU/mL

## 2018-05-07 NOTE — Progress Notes (Signed)
PULMONARY CONSULT NOTE  Requesting MD/Service: Lonell Grandchild, MD [facility]) Date of initial consultation: 05/05/18 Reason for consultation: Chronic cough, abnormal CT chest  PT PROFILE: 73 y.o. male former remote smoker (less than 1 PPD ages [age]) with no previously documented pulmonary disease referred for evaluation of chronic cough (3 months duration) and abnormalities on CT chest  DATA: 04/28/18 CT chest: Multifocal ground-glass attenuation airspace opacities with additional areas of ill-defined micro nodularity and mild bronchiolectasis in the right upper, right middle and lingular segment of the left upper lobes. Overall, the imaging appearance is most suggestive of a chronic indolent atypical infection such as MAC  INTERVAL:  HPI:  As above.  Per his report, he developed a "chest cold" in October 2019.  His symptoms of the time were cough productive of white phlegm with increased shortness of breath (mild).  He has been treated with repeated courses of antibiotics without resolution of his symptoms.  He continues to have mild to moderate cough productive of "phlegm" and mild to moderate shortness of breath (class I), most notable when walking steps.  He denies fever, hemoptysis, chest pain, orthopnea, paroxysmal nocturnal dyspnea, lower extremity edema, calf tenderness.  He does have intermittent heartburn symptoms.  He denies significant nasal or sinus symptoms.  He has no significant occupational exposures in the past.  He has no current environmental exposures.  He has no pets and does not participate in unusual hobbies.  However, most significantly, he was in the [employer] during the [place] [employer] and was stationed in [place] for almost 2 years at which time he was in close contact with numerous local residents.  He has never been tested for tuberculosis.    Past Medical History:  Diagnosis Date  . Acute MI ([place])   . CAD (coronary artery disease)   . Glaucoma   . HLD  (hyperlipidemia)   . HTN (hypertension)   . PTSD (post-traumatic stress disorder)     Past Surgical History:  Procedure Laterality Date  . CARDIAC CATHETERIZATION N/A 09/12/2015   Procedure: Left Heart Cath and Coronary Angiography;  Surgeon: Corey Skains, MD;  Location: [facility];  Service: Cardiovascular;  Laterality: N/A;  . CATARACT EXTRACTION W/ INTRAOCULAR LENS IMPLANT Left 05/09/2017  . CERVICAL SPINE SURGERY    . COLONOSCOPY  2008  . CORONARY ANGIOPLASTY WITH STENT PLACEMENT    . CORONARY ANGIOPLASTY WITH STENT PLACEMENT    . NASAL SINUS SURGERY    . SALIVARY GLAND SURGERY Right 2011   oncocytoma  . TONSILLECTOMY      MEDICATIONS: I have reviewed all medications and confirmed regimen as documented  Social History   Socioeconomic History  . Marital status: Married    Spouse name: Not on file  . Number of children: 2  . Years of education: Not on file  . Highest education level: Some college, no degree  Occupational History  . Occupation: retired  Scientific laboratory technician  . Financial resource strain: Not hard at all  . Food insecurity:    Worry: Never true    Inability: Never true  . Transportation needs:    Medical: No    Non-medical: No  Tobacco Use  . Smoking status: Never Smoker  . Smokeless tobacco: Never Used  Substance and Sexual Activity  . Alcohol use: No    Alcohol/week: 0.0 standard drinks  . Drug use: No  . Sexual activity: Not on file  Lifestyle  . Physical activity:    Days per week: 0  days    Minutes per session: 0 min  . Stress: Not at all  Relationships  . Social connections:    Talks on phone: More than three times a week    Gets together: More than three times a week    Attends religious service: Not on file    Active member of club or organization: No    Attends meetings of clubs or organizations: Never    Relationship status: Married  . Intimate partner violence:    Fear of current or ex partner: No    Emotionally abused: No     Physically abused: No    Forced sexual activity: No  Other Topics Concern  . Not on file  Social History Narrative   Lives at home with wife, independent at baseline    Family History  Problem Relation Age of Onset  . Hypertension Mother   . Stroke Mother   . Congestive Heart Failure Father     ROS: No fever, myalgias/arthralgias, unexplained weight loss or weight gain No new focal weakness or sensory deficits No otalgia, hearing loss, visual changes, nasal and sinus symptoms, mouth and throat problems No neck pain or adenopathy No abdominal pain, N/V/D, diarrhea, change in bowel pattern No dysuria, change in urinary pattern   Vitals:   05/05/18 1133 05/05/18 1143  BP:  140/88  Pulse:  65  SpO2:  93%  Weight: 167 lb 12.8 oz (76.1 kg)   Height: _0  (1.803 m)      EXAM:  Gen: WDWN, No overt respiratory distress HEENT: NCAT, sclera white, oropharynx normal Neck: Supple without LAN, thyromegaly, JVD Lungs: breath sounds full, normal percussion, no wheezes or other adventitious sounds Cardiovascular: RRR, no murmurs noted Abdomen: Soft, nontender, normal BS Ext: without clubbing, cyanosis, edema Neuro: CNs grossly intact, motor and sensory intact Skin: Limited exam, no lesions noted  DATA:   BMP Latest Ref Rng & Units 06/06/2016 06/05/2016 06/04/2016  Glucose 65 - 99 mg/dL 109(H) 121(H) 134(H)  BUN 6 - 20 mg/dL _1 Creatinine 0.61 - 1.24 mg/dL 0.55(L) 0.94 0.79  Sodium 135 - 145 mmol/L 140 130(L) 135  Potassium 3.5 - 5.1 mmol/L 3.9 3.7 3.8  Chloride 101 - 111 mmol/L 109 99(L) 101  CO2 22 - 32 mmol/L _2 Calcium 8.9 - 10.3 mg/dL 8.4(L) 8.1(L) 8.6(L)    CBC Latest Ref Rng & Units 07/12/2016 06/07/2016 06/06/2016  WBC 3.4 - 10.8 x10E3/uL 4.7 13.4(H) 14.6(H)  Hemoglobin 13.0 - 17.7 g/dL 12.8(L) 11.4(L) 11.4(L)  Hematocrit 37.5 - 51.0 % 40.3 32.2(L) 33.1(L)  Platelets 150 - 379 x10E3/uL 150 190 182    CXR 04/06/18: vague nodular density in the right  midlung, not significantly changed from prior film in March 2018.  I have personally reviewed all chest radiographs reported above including CXRs and CT chest unless otherwise indicated  IMPRESSION:     ICD-10-CM   1. Multiple pulmonary nodules R91.8   2. Granulomatous lung disease ([place]) J84.10 QuantiFERON-TB Gold Plus  3. DOE (dyspnea on exertion) R06.09 Pulmonary Function Test [facility] Only  4. Chronic cough R05 Pulmonary Function Test [facility] Only   CT scan findings are most suggestive of a low-grade granulomatous infection such as pulmonary MAC.  However, his service in [place] raises the concern also for tuberculosis.  If TB, it is likely not an active infection but rather, CT scan findings represent residual changes from a remote infection.  I am not convinced that the very  mild findings noted on CT scan of the chest (which have probably been present for a long time given the finding of lung nodule on CXR almost 2 years ago) have anything to do with his current symptoms of cough, sputum production and dyspnea.  His symptoms suggest possible chronic asthmatic bronchitis  PLAN:  QuantiFERON gold test ordered  Trial of Dulera inhaler, 2 actuations twice daily.  He is instructed to rinse mouth after use.  Sample has been provided.  I want him to use the sample up and then cycle off of the medication to be able to report back to me degree of improvement, if any, with this medication.  Follow-up in 4 weeks with PFTs prior to that visit   Merton Border, MD PCCM service Mobile [phone] Pager [phone] 05/07/2018 10:23 AM

## 2018-05-07 NOTE — Telephone Encounter (Signed)
Spoke to patient, he is aware of Dr. Sung Amabile response that it is okay to use the [facility]. Nothing further needed at this time.

## 2018-05-28 ENCOUNTER — Ambulatory Visit: Payer: Medicare PPO | Attending: Pulmonary Disease

## 2018-05-28 DIAGNOSIS — R053 Chronic cough: Secondary | ICD-10-CM

## 2018-05-28 DIAGNOSIS — R05 Cough: Secondary | ICD-10-CM

## 2018-05-28 DIAGNOSIS — R0609 Other forms of dyspnea: Secondary | ICD-10-CM

## 2018-05-28 MED ORDER — ALBUTEROL SULFATE (2.5 MG/3ML) 0.083% IN NEBU
2.5000 mg | INHALATION_SOLUTION | Freq: Once | RESPIRATORY_TRACT | Status: AC
  Administered 2018-05-28: 2.5 mg via RESPIRATORY_TRACT
  Filled 2018-05-28: qty 3

## 2018-06-02 ENCOUNTER — Other Ambulatory Visit: Payer: Self-pay

## 2018-06-02 ENCOUNTER — Telehealth: Payer: Self-pay | Admitting: Internal Medicine

## 2018-06-02 DIAGNOSIS — Z20828 Contact with and (suspected) exposure to other viral communicable diseases: Secondary | ICD-10-CM

## 2018-06-02 MED ORDER — OSELTAMIVIR PHOSPHATE 75 MG PO CAPS: 75.0000 mg | ORAL_CAPSULE | Freq: Two times a day (BID) | ORAL | Status: DC

## 2018-06-02 MED ORDER — OSELTAMIVIR PHOSPHATE 75 MG PO CAPS: 75.0000 mg | ORAL_CAPSULE | Freq: Two times a day (BID) | ORAL | 0 refills | Status: DC

## 2018-06-02 NOTE — Progress Notes (Signed)
Dominic Mcmillan

## 2018-06-02 NOTE — Telephone Encounter (Signed)
We cannot prescribe preventative unless the patient lives in the same home as the patients. Please inform them and just let them know if they start getting sx to come in to be seen.  Thank you.

## 2018-06-02 NOTE — Telephone Encounter (Signed)
Pt and wife were exposed to flu today due to having grandson over, would  Like medication sent in to [facility] in [place] for both.

## 2018-06-02 NOTE — Progress Notes (Signed)
-  Tamiflu 

## 2018-06-05 ENCOUNTER — Ambulatory Visit: Payer: Medicare PPO | Admitting: Pulmonary Disease

## 2018-06-10 ENCOUNTER — Telehealth: Payer: Self-pay | Admitting: Internal Medicine

## 2018-06-10 ENCOUNTER — Encounter: Payer: Self-pay | Admitting: Internal Medicine

## 2018-06-10 ENCOUNTER — Ambulatory Visit: Payer: Medicare PPO | Admitting: Internal Medicine

## 2018-06-10 ENCOUNTER — Other Ambulatory Visit: Payer: Self-pay | Admitting: Internal Medicine

## 2018-06-10 VITALS — BP 119/78 | HR 68 | Temp 99.9°F | Resp 16 | Ht 71.0 in | Wt 168.0 lb

## 2018-06-10 DIAGNOSIS — R6889 Other general symptoms and signs: Secondary | ICD-10-CM

## 2018-06-10 DIAGNOSIS — J0100 Acute maxillary sinusitis, unspecified: Secondary | ICD-10-CM

## 2018-06-10 MED ORDER — DOXYCYCLINE HYCLATE 100 MG PO TABS: 100.0000 mg | ORAL_TABLET | Freq: Two times a day (BID) | ORAL | 0 refills | Status: AC

## 2018-06-10 MED ORDER — OSELTAMIVIR PHOSPHATE 75 MG PO CAPS: 75.0000 mg | ORAL_CAPSULE | Freq: Two times a day (BID) | ORAL | 0 refills | Status: AC

## 2018-06-10 NOTE — Telephone Encounter (Signed)
I called patient and instructed him to take tylenol 1000 mg every 6 hours, increase fluids, begin Doxycycline given today and sent in Tamiflu 75 mg bid x 5 days.  He will call back if sx worsen.

## 2018-06-10 NOTE — Progress Notes (Signed)
Date:  06/10/2018   Name:  Dominic Mcmillan   DOB:  10/08/1945   MRN:  [id number]   Chief Complaint: Sinus Problem (5 days ) and Cough (5 days )  Sinus Problem  This is a new problem. The current episode started in the past 7 days. The problem has been gradually worsening since onset. The maximum temperature recorded prior to his arrival was 100.4 - 100.9 F. The fever has been present for 1 to 2 days. The pain is mild. Associated symptoms include coughing, headaches (mild headache) and sinus pressure. Pertinent negatives include no chills, diaphoresis, ear pain or shortness of breath. Past treatments include saline sprays. The treatment provided mild relief.    Review of Systems  Constitutional: Positive for fatigue. Negative for chills, diaphoresis and fever.  HENT: Positive for postnasal drip, sinus pressure and sinus pain. Negative for ear pain and trouble swallowing.   Respiratory: Positive for cough. Negative for chest tightness, shortness of breath and wheezing.   Cardiovascular: Negative for chest pain, palpitations and leg swelling.  Musculoskeletal: Negative for myalgias.  Allergic/Immunologic: Negative for environmental allergies.  Neurological: Positive for headaches (mild headache). Negative for dizziness and light-headedness.    Patient Active Problem List   Diagnosis Date Noted  . Pulmonary nodules/lesions, multiple 04/30/2018  . Pure hypercholesterolemia 10/08/2017  . Primary open angle glaucoma (POAG) of left eye, severe stage 12/18/2016  . Primary open angle glaucoma (POAG) of right eye, mild stage 12/18/2016  . Primary open angle glaucoma of right eye, mild stage 08/14/2016  . Cataract, nuclear sclerotic, both eyes 08/14/2016  . Sepsis (HCC) 06/04/2016  . Thrombocytopenia (HCC) 09/12/2015  . Hyperglycemia 09/12/2015  . Chest pain with high risk for cardiac etiology 08/31/2015  . Otitis media 03/28/2015  . Familial multiple lipoprotein-type hyperlipidemia  02/13/2015  . Hearing loss 02/13/2015  . Post-traumatic stress disorder 02/13/2015  . Arteriosclerosis of coronary artery 02/13/2015  . Degeneration of intervertebral disc of lumbar region 02/13/2015  . Essential (primary) hypertension 02/13/2015  . Discoloration of nail 02/13/2015  . GERD (gastroesophageal reflux disease) 03/14/2014  . History of cardiac catheterization 03/11/2014    Allergies  Allergen Reactions  . Nortriptyline Hcl   . Prazosin Hcl     joint pain  . Quetiapine Fumarate   . Tizanidine   . Imdur [Isosorbide Dinitrate] Other (See Comments)    Hypotension    Past Surgical History:  Procedure Laterality Date  . CARDIAC CATHETERIZATION N/A 09/12/2015   Procedure: Left Heart Cath and Coronary Angiography;  Surgeon: Lamar Blinks, MD;  Location: [facility] INVASIVE CV LAB;  Service: Cardiovascular;  Laterality: N/A;  . CATARACT EXTRACTION W/ INTRAOCULAR LENS IMPLANT Left 05/09/2017  . CERVICAL SPINE SURGERY    . COLONOSCOPY  2008  . CORONARY ANGIOPLASTY WITH STENT PLACEMENT    . CORONARY ANGIOPLASTY WITH STENT PLACEMENT    . NASAL SINUS SURGERY    . SALIVARY GLAND SURGERY Right 2011   oncocytoma  . TONSILLECTOMY      Social History   Tobacco Use  . Smoking status: Never Smoker  . Smokeless tobacco: Never Used  Substance Use Topics  . Alcohol use: No    Alcohol/week: 0.0 standard drinks  . Drug use: No     Medication list has been reviewed and updated.  Current Meds  Medication Sig  . aspirin (ECOTRIN LOW STRENGTH) 81 MG EC tablet Take 1 tablet by mouth daily.  . Cholecalciferol (HM VITAMIN D3) 2000 units CAPS Take 1  capsule by mouth daily.   . clopidogrel (PLAVIX) 75 MG tablet Take 1 tablet by mouth daily.  Marland Kitchen co-enzyme Q-10 30 MG capsule Take 30 mg by mouth 3 (three) times daily.  . DORZOLAMIDE HCL-TIMOLOL MAL OP Place 1 drop into the left eye 2 (two) times daily.   . famotidine (PEPCID) 10 MG tablet Take 10 mg by mouth 2 (two) times daily.  Marland Kitchen  HYDROcodone-acetaminophen (NORCO/VICODIN) 5-325 MG tablet Take 1 tablet by mouth every 6 (six) hours as needed for moderate pain.  Marland Kitchen ketoconazole (NIZORAL) 2 % shampoo KETOCONAZOLE, 2% (External Shampoo) - Historical Medication  (2 %) Active  . latanoprost (XALATAN) 0.005 % ophthalmic solution Place 1 drop into both eyes at bedtime.   Marland Kitchen LORazepam (ATIVAN) 1 MG tablet Take 1 mg by mouth 2 (two) times daily.  . Multiple Vitamins-Minerals (CENTRUM SILVER) tablet Take 1 tablet by mouth daily.   . nitroGLYCERIN (NITROSTAT) 0.4 MG SL tablet Place under the tongue.  Marland Kitchen olmesartan (BENICAR) 20 MG tablet Take 1 tablet by mouth daily.  . pseudoephedrine-guaifenesin (TUSSIN PE) 30-100 MG/5ML SYRP Take 5 mLs by mouth every 4 (four) hours as needed.  . rosuvastatin (CRESTOR) 40 MG tablet Take 20 mg by mouth daily.  . vitamin C (ASCORBIC ACID) 500 MG tablet Take 1,000 mg by mouth daily.   . [DISCONTINUED] mometasone-formoterol (DULERA) 200-5 MCG/ACT AERO Inhale 2 puffs into the lungs 2 (two) times daily.    PHQ 2/9 Scores 04/13/2018 02/27/2018 02/20/2017 08/02/2015  PHQ - 2 Score 0 0 0 0    Physical Exam Constitutional:      Appearance: He is well-developed.  HENT:     Nose: No congestion or rhinorrhea.     Right Sinus: Maxillary sinus tenderness and frontal sinus tenderness present.     Left Sinus: Maxillary sinus tenderness and frontal sinus tenderness present.     Mouth/Throat:     Mouth: Mucous membranes are moist. No oral lesions.     Pharynx: Uvula midline. Posterior oropharyngeal erythema present. No oropharyngeal exudate.  Eyes:     Pupils: Pupils are equal, round, and reactive to light.  Cardiovascular:     Rate and Rhythm: Normal rate and regular rhythm.     Heart sounds: Normal heart sounds.  Pulmonary:     Breath sounds: Normal breath sounds. No wheezing or rales.  Lymphadenopathy:     Cervical: No cervical adenopathy.  Neurological:     Mental Status: He is alert and oriented to  person, place, and time.     BP 119/78   Pulse 68   Temp 99.9 F (37.7 C) (Oral)   Resp 16   Ht 5\' 11"  (1.803 m)   Wt 168 lb (76.2 kg)   SpO2 94%   BMI 23.43 kg/m   Assessment and Plan: 1. Acute non-recurrent maxillary sinusitis Continue fluids, tylenol, monitor temperature Begin antibiotics if sx worsen - doxycycline (VIBRA-TABS) 100 MG tablet; Take 1 tablet (100 mg total) by mouth 2 (two) times daily for 10 days.  Dispense: 20 tablet; Refill: 0   Partially dictated using Animal nutritionist. Any errors are unintentional.  Bari Edward, MD [facility]  06/10/2018

## 2018-06-10 NOTE — Telephone Encounter (Signed)
Pt currently has a temperature of 101.5 and is feeling nauseous and is having chills, not sure if he needs to have another medication called in or should just stay put. Please advise.

## 2018-06-11 NOTE — Telephone Encounter (Signed)
Advised 

## 2018-06-25 ENCOUNTER — Encounter: Payer: Self-pay | Admitting: Pulmonary Disease

## 2018-06-25 ENCOUNTER — Ambulatory Visit: Payer: Medicare PPO | Admitting: Pulmonary Disease

## 2018-06-25 ENCOUNTER — Telehealth: Payer: Self-pay | Admitting: Pulmonary Disease

## 2018-06-25 VITALS — BP 130/62 | HR 69 | Resp 16 | Ht 71.0 in | Wt 164.0 lb

## 2018-06-25 DIAGNOSIS — J8410 Pulmonary fibrosis, unspecified: Secondary | ICD-10-CM | POA: Diagnosis not present

## 2018-06-25 DIAGNOSIS — J410 Simple chronic bronchitis: Secondary | ICD-10-CM

## 2018-06-25 DIAGNOSIS — R053 Chronic cough: Secondary | ICD-10-CM

## 2018-06-25 DIAGNOSIS — R05 Cough: Secondary | ICD-10-CM | POA: Diagnosis not present

## 2018-06-25 DIAGNOSIS — R0609 Other forms of dyspnea: Secondary | ICD-10-CM

## 2018-06-25 MED ORDER — MOMETASONE FUROATE 220 MCG/INH IN AEPB: 2.0000 | INHALATION_SPRAY | Freq: Every day | RESPIRATORY_TRACT | 10 refills | Status: DC

## 2018-06-25 MED ORDER — MOMETASONE FUROATE 200 MCG/ACT IN AERO: 2.0000 | INHALATION_SPRAY | Freq: Every day | RESPIRATORY_TRACT | 0 refills | Status: DC

## 2018-06-25 NOTE — Progress Notes (Signed)
PULMONARY OFFICE FOLLOW-UP NOTE  Requesting MD/Service: Lonell Grandchild, MD [facility]) Date of initial consultation: 05/05/18 Reason for consultation: Chronic cough, abnormal CT chest  PT PROFILE: 73 y.o. male former remote smoker (less than 1 PPD ages [age]) with no previously documented pulmonary disease referred for evaluation of chronic cough (3 months duration) and abnormalities on CT chest  DATA: 04/28/18 CT chest: Multifocal ground-glass attenuation airspace opacities with additional areas of ill-defined micro nodularity and mild bronchiolectasis in the right upper, right middle and lingular segment of the left upper lobes. Overall, the imaging appearance is most suggestive of a chronic indolent atypical infection such as MAC 05/28/18 PFTs: FVC: 3.76 > 3.83 L (88 > 90 %pred), FEV1: 2.70 > 2.88 L (85 > 90 %pred), FEV1/FVC: 72%, TLC: 5.82 L (80 %pred), DLCO 102 %pred    INTERVAL: Initial consultation 05/05/2018.  Since that evaluation, he has been treated for a respiratory tract infection with oseltamivir and doxycycline.  SUBJ:  This is a scheduled follow-up.  He has been treated recently for presumed influenza as documented above.  He now is back to his baseline.  He has no new complaints.  Last visit, I provided him with samples of Dulera inhaler and recommended that he use it until completion to discern whether it was of any benefit.  He believes that it was beneficial.  However, since completion of the inhaler, he has had no relapse in his symptoms of exertional dyspnea.  He does have very mild cough with mild mucus production.  He denies fever, pleuritic chest pain, hemoptysis, orthopnea, paroxysmal nocturnal dyspnea, lower extremity edema, calf tenderness.    Vitals:   06/25/18 0947 06/25/18 0951  BP:  130/62  Pulse:  69  Resp: 16   SpO2:  96%  Weight: 164 lb (74.4 kg)   Height: 5' 11"  (1.803 m)   Room air   EXAM:  Gen: NAD HEENT: NCAT, sclerae white Neck: No JVD Lungs:  breath sounds full, no wheezes or other adventitious sounds Cardiovascular: RRR, no murmurs Abdomen: Soft, nontender, normal BS Ext: without clubbing, cyanosis, edema Neuro: grossly intact Skin: Limited exam, no lesions noted   DATA:   BMP Latest Ref Rng & Units 06/06/2016 06/05/2016 06/04/2016  Glucose 65 - 99 mg/dL 109(H) 121(H) 134(H)  BUN 6 - 20 mg/dL 10 19 20   Creatinine 0.61 - 1.24 mg/dL 0.55(L) 0.94 0.79  Sodium 135 - 145 mmol/L 140 130(L) 135  Potassium 3.5 - 5.1 mmol/L 3.9 3.7 3.8  Chloride 101 - 111 mmol/L 109 99(L) 101  CO2 22 - 32 mmol/L 27 26 26   Calcium 8.9 - 10.3 mg/dL 8.4(L) 8.1(L) 8.6(L)    CBC Latest Ref Rng & Units 07/12/2016 06/07/2016 06/06/2016  WBC 3.4 - 10.8 x10E3/uL 4.7 13.4(H) 14.6(H)  Hemoglobin 13.0 - 17.7 g/dL 12.8(L) 11.4(L) 11.4(L)  Hematocrit 37.5 - 51.0 % 40.3 32.2(L) 33.1(L)  Platelets 150 - 379 x10E3/uL 150 190 182    CXR: NNF  I have personally reviewed all chest radiographs reported above including CXRs and CT chest unless otherwise indicated  IMPRESSION:     ICD-10-CM   1. Granulomatous lung disease ([place]) J84.10 DG Chest 2 View  2. Exertional dyspnea, resolved R06.09   3. Chronic cough, mild R05   4. Mild chronic bronchitis (HCC) J41.0    QuantiFERON gold was negative.  CT scan findings are most suggestive of an old or low-grade granulomatous infection such as pulmonary MAC.  This should be followed regularly with a minimum of annual  CXR for now.    PLAN:  New prescription: Asmanex inhaler, 2 actuations daily.  He is instructed to rinse mouth after use  Follow-up in 6 months with CXR prior to that visit.  Call sooner if needed   Merton Border, MD PCCM service Mobile [phone] Pager [phone] 06/25/2018 12:12 PM

## 2018-06-25 NOTE — Telephone Encounter (Signed)
Pts insurance will not cover Asmanex. It will cover Flovent Diskus, Flovent HFA and Arnuity.

## 2018-06-25 NOTE — Patient Instructions (Addendum)
New prescription: Asmanex inhaler, 2 actuations daily..  Rinse mouth after use  Follow-up in 6 months with CXR prior to that visit

## 2018-06-25 NOTE — Telephone Encounter (Signed)
Message routed to [facility]. 

## 2018-06-26 MED ORDER — FLUTICASONE PROPIONATE HFA 110 MCG/ACT IN AERO: 2.0000 | INHALATION_SPRAY | Freq: Two times a day (BID) | RESPIRATORY_TRACT | 10 refills | Status: DC

## 2018-06-26 NOTE — Telephone Encounter (Signed)
Order changed to Flovent [facility]  Thanks

## 2018-06-29 NOTE — Telephone Encounter (Signed)
Left message that inhaler would be changed to Flovent HFA after completing Asmanex. Nothing further needed.

## 2018-07-23 ENCOUNTER — Other Ambulatory Visit: Payer: Self-pay

## 2018-07-23 ENCOUNTER — Telehealth: Payer: Self-pay

## 2018-07-23 ENCOUNTER — Emergency Department
Admission: EM | Admit: 2018-07-23 | Discharge: 2018-07-23 | Disposition: A | Discharge status: Home / Self Care | Payer: Medicare PPO | Attending: Student in an Organized Health Care Education/Training Program | Admitting: Student in an Organized Health Care Education/Training Program

## 2018-07-23 ENCOUNTER — Emergency Department: Payer: Medicare PPO

## 2018-07-23 ENCOUNTER — Encounter: Payer: Self-pay | Admitting: Emergency Medicine

## 2018-07-23 DIAGNOSIS — Z7902 Long term (current) use of antithrombotics/antiplatelets: Secondary | ICD-10-CM | POA: Diagnosis not present

## 2018-07-23 DIAGNOSIS — R079 Chest pain, unspecified: Secondary | ICD-10-CM | POA: Diagnosis not present

## 2018-07-23 DIAGNOSIS — Z7982 Long term (current) use of aspirin: Secondary | ICD-10-CM | POA: Insufficient documentation

## 2018-07-23 DIAGNOSIS — I10 Essential (primary) hypertension: Secondary | ICD-10-CM | POA: Insufficient documentation

## 2018-07-23 DIAGNOSIS — I252 Old myocardial infarction: Secondary | ICD-10-CM | POA: Insufficient documentation

## 2018-07-23 DIAGNOSIS — R110 Nausea: Secondary | ICD-10-CM | POA: Diagnosis not present

## 2018-07-23 DIAGNOSIS — Z79899 Other long term (current) drug therapy: Secondary | ICD-10-CM | POA: Diagnosis not present

## 2018-07-23 DIAGNOSIS — I2510 Atherosclerotic heart disease of native coronary artery without angina pectoris: Secondary | ICD-10-CM | POA: Insufficient documentation

## 2018-07-23 DIAGNOSIS — R42 Dizziness and giddiness: Secondary | ICD-10-CM | POA: Insufficient documentation

## 2018-07-23 LAB — CBC WITH DIFFERENTIAL/PLATELET
Abs Immature Granulocytes: 0.02 10*3/uL (ref 0.00–0.07)
Basophils Absolute: 0 10*3/uL (ref 0.0–0.1)
Basophils Relative: 0 %
EOS ABS: 0 10*3/uL (ref 0.0–0.5)
Eosinophils Relative: 0 %
HCT: 39 % (ref 39.0–52.0)
Hemoglobin: 13.3 g/dL (ref 13.0–17.0)
Immature Granulocytes: 0 %
Lymphocytes Relative: 13 %
Lymphs Abs: 1 10*3/uL (ref 0.7–4.0)
MCH: 32.5 pg (ref 26.0–34.0)
MCHC: 34.1 g/dL (ref 30.0–36.0)
MCV: 95.4 fL (ref 80.0–100.0)
MONOS PCT: 7 %
Monocytes Absolute: 0.6 10*3/uL (ref 0.1–1.0)
Neutro Abs: 6.3 10*3/uL (ref 1.7–7.7)
Neutrophils Relative %: 80 %
Platelets: 163 10*3/uL (ref 150–400)
RBC: 4.09 MIL/uL — ABNORMAL LOW (ref 4.22–5.81)
RDW: 12.9 % (ref 11.5–15.5)
WBC: 7.9 10*3/uL (ref 4.0–10.5)
nRBC: 0 % (ref 0.0–0.2)

## 2018-07-23 LAB — URINALYSIS, COMPLETE (UACMP) WITH MICROSCOPIC
Bacteria, UA: NONE SEEN
Bilirubin Urine: NEGATIVE
Glucose, UA: NEGATIVE mg/dL
Hgb urine dipstick: NEGATIVE
KETONES UR: 5 mg/dL — AB
Leukocytes,Ua: NEGATIVE
Nitrite: NEGATIVE
PH: 7 (ref 5.0–8.0)
Protein, ur: NEGATIVE mg/dL
Specific Gravity, Urine: 1.005 (ref 1.005–1.030)
Squamous Epithelial / HPF: NONE SEEN (ref 0–5)
WBC, UA: NONE SEEN WBC/hpf (ref 0–5)

## 2018-07-23 LAB — COMPREHENSIVE METABOLIC PANEL
ALT: 17 U/L (ref 0–44)
AST: 28 U/L (ref 15–41)
Albumin: 4.2 g/dL (ref 3.5–5.0)
Alkaline Phosphatase: 43 U/L (ref 38–126)
Anion gap: 8 (ref 5–15)
BUN: 15 mg/dL (ref 8–23)
CO2: 25 mmol/L (ref 22–32)
Calcium: 9.8 mg/dL (ref 8.9–10.3)
Chloride: 102 mmol/L (ref 98–111)
Creatinine, Ser: 0.56 mg/dL — ABNORMAL LOW (ref 0.61–1.24)
GFR calc non Af Amer: >60 mL/min (ref >60)
Glucose, Bld: 97 mg/dL (ref 70–99)
Potassium: 3.4 mmol/L — ABNORMAL LOW (ref 3.5–5.1)
Sodium: 135 mmol/L (ref 135–145)
Total Bilirubin: 0.8 mg/dL (ref 0.3–1.2)
Total Protein: 6.7 g/dL (ref 6.5–8.1)

## 2018-07-23 LAB — TROPONIN I: Troponin I: <0.03 ng/mL (ref <0.03)

## 2018-07-23 MED ORDER — SUCRALFATE 1 G PO TABS
1.0000 g | ORAL_TABLET | Freq: Once | ORAL | Status: AC
  Administered 2018-07-23: 1 g via ORAL
  Filled 2018-07-23: qty 1

## 2018-07-23 MED ORDER — ONDANSETRON 4 MG PO TBDP: 4.0000 mg | ORAL_TABLET | Freq: Three times a day (TID) | ORAL | 0 refills | Status: DC | PRN

## 2018-07-23 MED ORDER — ONDANSETRON HCL 4 MG/2ML IJ SOLN
4.0000 mg | Freq: Once | INTRAMUSCULAR | Status: AC
  Administered 2018-07-23: 4 mg via INTRAVENOUS
  Filled 2018-07-23: qty 2

## 2018-07-23 MED ORDER — IOHEXOL 300 MG/ML  SOLN
100.0000 mL | Freq: Once | INTRAMUSCULAR | Status: AC | PRN
  Administered 2018-07-23: 100 mL via INTRAVENOUS

## 2018-07-23 MED ORDER — ONDANSETRON 4 MG PO TBDP
4.0000 mg | ORAL_TABLET | Freq: Once | ORAL | Status: AC
  Administered 2018-07-23: 4 mg via ORAL
  Filled 2018-07-23: qty 1

## 2018-07-23 NOTE — Telephone Encounter (Signed)
Patient called saying he woke up this morning with extreme nausea and chest tightness. Tool nitro tabs and aspirin about 10 mins ago but still not feeling well.   Told patient to hang up the phone and call 911 to go to the hospital. Told him it could be his heart. He verbalized understanding and said he was doing it now.

## 2018-07-23 NOTE — ED Triage Notes (Signed)
Patient states last night around 10pm he felt "hypoglycemic because I didn't eat good."  Patient states he took his blood pressure and it was 91/55.  Patient states he ate a couple of pancakes and drank some water.  Patient states he then felt better.  Afterward patient's blood pressure was 101/70 and he went to bed.  Patient states at 4:15 this am he woke and felt very nauseous.  Patient reports feeling light headed with sitting up.  Patient states he ate an egg and did not feel better.  Patient states he called his PCP at 9am and was instructed to call EMS due to patient's history.  EMS came to patient's house and performed an EKG.  EMS recommended patient come to the ED even though the EKG did not look concerning.

## 2018-07-23 NOTE — ED Notes (Signed)
Patient given water for PO challenge.  

## 2018-07-23 NOTE — Discharge Instructions (Addendum)

## 2018-07-23 NOTE — ED Notes (Signed)
Pt is A/Ox4. Pt denies CP/SHOB. Pt is experiencing Nausea/lightheaded at this time. Pt st he has chronic pain 3/10, neck and upper shoulders.

## 2018-07-23 NOTE — ED Provider Notes (Signed)
[facility] Emergency Department Provider Note    First MD Initiated Contact with Patient 07/23/18 1553     (approximate)  I have reviewed the triage vital signs and the nursing notes.   HISTORY  Chief Complaint Dizziness    HPI Dominic Mcmillan is a 73 y.o. male extensive past medical history as listed below presents the ER for of nausea starting last night.  Denies any focal chest pain but did have some shortness of breath.  No measured fevers.  States he checked his blood pressure last night and was low and he had not had much to eat so he ate some pancakes and felt better.  Repeat blood pressure was in the 1 teens.  He then went to sleep woke up feeling nauseated again.  Denies any abdominal pain.  States he is having some increased urinary frequency but no diarrhea.  Has not had any vomiting.  Called his PCP who directed him to the ER for further evaluation.    Past Medical History:  Diagnosis Date  . Acute MI (HCC)   . CAD (coronary artery disease)   . Glaucoma   . HLD (hyperlipidemia)   . HTN (hypertension)   . PTSD (post-traumatic stress disorder)    Family History  Problem Relation Age of Onset  . Hypertension Mother   . Stroke Mother   . Congestive Heart Failure Father    Past Surgical History:  Procedure Laterality Date  . CARDIAC CATHETERIZATION N/A 09/12/2015   Procedure: Left Heart Cath and Coronary Angiography;  Surgeon: Lamar Blinks, MD;  Location: [facility] INVASIVE CV LAB;  Service: Cardiovascular;  Laterality: N/A;  . CATARACT EXTRACTION W/ INTRAOCULAR LENS IMPLANT Left 05/09/2017  . CERVICAL SPINE SURGERY    . COLONOSCOPY  2008  . CORONARY ANGIOPLASTY WITH STENT PLACEMENT    . CORONARY ANGIOPLASTY WITH STENT PLACEMENT    . NASAL SINUS SURGERY    . SALIVARY GLAND SURGERY Right 2011   oncocytoma  . TONSILLECTOMY     Patient Active Problem List   Diagnosis Date Noted  . Pulmonary nodules/lesions, multiple 04/30/2018  .  Pure hypercholesterolemia 10/08/2017  . Primary open angle glaucoma (POAG) of left eye, severe stage 12/18/2016  . Primary open angle glaucoma (POAG) of right eye, mild stage 12/18/2016  . Primary open angle glaucoma of right eye, mild stage 08/14/2016  . Cataract, nuclear sclerotic, both eyes 08/14/2016  . Sepsis (HCC) 06/04/2016  . Thrombocytopenia (HCC) 09/12/2015  . Hyperglycemia 09/12/2015  . Chest pain with high risk for cardiac etiology 08/31/2015  . Otitis media 03/28/2015  . Familial multiple lipoprotein-type hyperlipidemia 02/13/2015  . Hearing loss 02/13/2015  . Post-traumatic stress disorder 02/13/2015  . Arteriosclerosis of coronary artery 02/13/2015  . Degeneration of intervertebral disc of lumbar region 02/13/2015  . Essential (primary) hypertension 02/13/2015  . Discoloration of nail 02/13/2015  . GERD (gastroesophageal reflux disease) 03/14/2014  . History of cardiac catheterization 03/11/2014      Prior to Admission medications   Medication Sig Start Date End Date Taking? Authorizing Provider  aspirin (ECOTRIN LOW STRENGTH) 81 MG EC tablet Take 1 tablet by mouth daily.    [provider]  Cholecalciferol (HM VITAMIN D3) 2000 units CAPS Take 1 capsule by mouth daily.     [provider]  clopidogrel (PLAVIX) 75 MG tablet Take 1 tablet by mouth daily.    [provider]  co-enzyme Q-10 30 MG capsule Take 30 mg by mouth 3 (three)  times daily.    [provider]  DORZOLAMIDE HCL-TIMOLOL MAL OP Place 1 drop into the left eye 2 (two) times daily.     [provider]  famotidine (PEPCID) 10 MG tablet Take 10 mg by mouth 2 (two) times daily.    [provider]  fluticasone (FLOVENT HFA) 110 MCG/ACT inhaler Inhale 2 puffs into the lungs 2 (two) times daily. 06/26/18 06/26/19  Merwyn Katos, MD  HYDROcodone-acetaminophen (NORCO/VICODIN) 5-325 MG tablet Take 1 tablet by mouth every 6 (six) hours as needed for moderate pain.     [provider]  ketoconazole (NIZORAL) 2 % shampoo KETOCONAZOLE, 2% (External Shampoo) - Historical Medication  (2 %) Active    [provider]  latanoprost (XALATAN) 0.005 % ophthalmic solution Place 1 drop into both eyes at bedtime.     [provider]  LORazepam (ATIVAN) 1 MG tablet Take 1 mg by mouth 2 (two) times daily.    [provider]  Multiple Vitamins-Minerals (CENTRUM SILVER) tablet Take 1 tablet by mouth daily.     [provider]  nitroGLYCERIN (NITROSTAT) 0.4 MG SL tablet Place under the tongue.    [provider]  olmesartan (BENICAR) 20 MG tablet Take 1 tablet by mouth daily.    [provider]  ondansetron (ZOFRAN ODT) 4 MG disintegrating tablet Take 1 tablet (4 mg total) by mouth every 8 (eight) hours as needed for nausea or vomiting. 07/23/18   Willy Eddy, MD  pseudoephedrine-guaifenesin (TUSSIN PE) 30-100 MG/5ML SYRP Take 5 mLs by mouth every 4 (four) hours as needed.    [provider]  rosuvastatin (CRESTOR) 40 MG tablet Take 20 mg by mouth daily.    [provider]  vitamin C (ASCORBIC ACID) 500 MG tablet Take 1,000 mg by mouth daily.     [provider]    Allergies Nortriptyline hcl; Prazosin hcl; Quetiapine fumarate; Tizanidine; and Imdur [isosorbide dinitrate]    Social History Social History   Tobacco Use  . Smoking status: Never Smoker  . Smokeless tobacco: Never Used  Substance Use Topics  . Alcohol use: No    Alcohol/week: 0.0 standard drinks  . Drug use: No    Review of Systems Patient denies headaches, rhinorrhea, blurry vision, numbness, shortness of breath, chest pain, edema, cough, abdominal pain, nausea, vomiting, diarrhea, dysuria, fevers, rashes or hallucinations unless otherwise stated above in HPI. ____________________________________________   PHYSICAL EXAM:  VITAL SIGNS: Vitals:   07/23/18 1700 07/23/18 1800  BP: (!) 149/78 140/72   Pulse: (!) 56 65  Resp: 16   Temp:    SpO2: 94% 94%    Constitutional: Alert and oriented.  Eyes: Conjunctivae are normal.  Head: Atraumatic. Nose: No congestion/rhinnorhea. Mouth/Throat: Mucous membranes are moist.   Neck: No stridor. Painless ROM.  Cardiovascular: Normal rate, regular rhythm. Grossly normal heart sounds.  Good peripheral circulation. Respiratory: Normal respiratory effort.  No retractions. Lungs CTAB. Gastrointestinal: Soft and nontender. No distention. No abdominal bruits. No CVA tenderness. Genitourinary:  Musculoskeletal: No lower extremity tenderness nor edema.  No joint effusions. Neurologic:  Normal speech and language. No gross focal neurologic deficits are appreciated. No facial droop Skin:  Skin is warm, dry and intact. No rash noted. Psychiatric: Mood and affect are normal. Speech and behavior are normal.  ____________________________________________   LABS (all labs ordered are listed, but only abnormal results are displayed)  Results for orders placed or performed during the hospital encounter of 07/23/18 (from the past 24  hour(s))  CBC with Differential/Platelet     Status: Abnormal   Collection Time: 07/23/18  4:14 PM  Result Value Ref Range   WBC 7.9 4.0 - 10.5 K/uL   RBC 4.09 (L) 4.22 - 5.81 MIL/uL   Hemoglobin 13.3 13.0 - 17.0 g/dL   HCT 16.139.0 09.639.0 - 04.552.0 %   MCV 95.4 80.0 - 100.0 fL   MCH 32.5 26.0 - 34.0 pg   MCHC 34.1 30.0 - 36.0 g/dL   RDW 40.912.9 81.111.5 - 91.415.5 %   Platelets 163 150 - 400 K/uL   nRBC 0.0 0.0 - 0.2 %   Neutrophils Relative % 80 %   Neutro Abs 6.3 1.7 - 7.7 K/uL   Lymphocytes Relative 13 %   Lymphs Abs 1.0 0.7 - 4.0 K/uL   Monocytes Relative 7 %   Monocytes Absolute 0.6 0.1 - 1.0 K/uL   Eosinophils Relative 0 %   Eosinophils Absolute 0.0 0.0 - 0.5 K/uL   Basophils Relative 0 %   Basophils Absolute 0.0 0.0 - 0.1 K/uL   Immature Granulocytes 0 %   Abs Immature Granulocytes 0.02 0.00 - 0.07 K/uL  Comprehensive  metabolic panel     Status: Abnormal   Collection Time: 07/23/18  4:14 PM  Result Value Ref Range   Sodium 135 135 - 145 mmol/L   Potassium 3.4 (L) 3.5 - 5.1 mmol/L   Chloride 102 98 - 111 mmol/L   CO2 25 22 - 32 mmol/L   Glucose, Bld 97 70 - 99 mg/dL   BUN 15 8 - 23 mg/dL   Creatinine, Ser 7.820.56 (L) 0.61 - 1.24 mg/dL   Calcium 9.8 8.9 - 95.610.3 mg/dL   Total Protein 6.7 6.5 - 8.1 g/dL   Albumin 4.2 3.5 - 5.0 g/dL   AST 28 15 - 41 U/L   ALT 17 0 - 44 U/L   Alkaline Phosphatase 43 38 - 126 U/L   Total Bilirubin 0.8 0.3 - 1.2 mg/dL   GFR calc non Af Amer >60 >60 mL/min   GFR calc Af Amer >60 >60 mL/min   Anion gap 8 5 - 15  Urinalysis, Complete w Microscopic     Status: Abnormal   Collection Time: 07/23/18  4:14 PM  Result Value Ref Range   Color, Urine STRAW (A) YELLOW   APPearance CLEAR (A) CLEAR   Specific Gravity, Urine 1.005 1.005 - 1.030   pH 7.0 5.0 - 8.0   Glucose, UA NEGATIVE NEGATIVE mg/dL   Hgb urine dipstick NEGATIVE NEGATIVE   Bilirubin Urine NEGATIVE NEGATIVE   Ketones, ur 5 (A) NEGATIVE mg/dL   Protein, ur NEGATIVE NEGATIVE mg/dL   Nitrite NEGATIVE NEGATIVE   Leukocytes,Ua NEGATIVE NEGATIVE   WBC, UA NONE SEEN 0 - 5 WBC/hpf   Bacteria, UA NONE SEEN NONE SEEN   Squamous Epithelial / LPF NONE SEEN 0 - 5  Troponin I - ONCE - STAT     Status: None   Collection Time: 07/23/18  4:14 PM  Result Value Ref Range   Troponin I <0.03 <0.03 ng/mL   ____________________________________________  EKG My review and personal interpretation at Time: 13:08   Indication: dizziness  Rate: 75  Rhythm: sinus Axis: normal Other: normal intervals, no stemi ____________________________________________  RADIOLOGY  I personally reviewed all radiographic images ordered to evaluate for the above acute complaints and reviewed radiology reports and findings.  These findings were personally discussed with the patient.  Please see medical record for radiology report.  ____________________________________________   PROCEDURES  Procedure(s) performed:  Procedures    Critical Care performed: no ____________________________________________   INITIAL IMPRESSION / ASSESSMENT AND PLAN / ED COURSE  Pertinent labs & imaging results that were available during my care of the patient were reviewed by me and considered in my medical decision making (see chart for details).   DDX: acs, dehydration, electrolye abn, gastritis, enteritis, pna,   CHANLER ARTIS is a 73 y.o. who presents to the ED with was as described above.  Patient nontoxic-appearing but describing nausea discomfort for the past 24 hours.  Blood will be sent for the by differential.  Will order antiemetics.  EKG shows no evidence of ischemia.  Troponin negative.  Blood work fairly reassuring.  I have given IV hydration as well as IV Zofran without improvement.  Based on patient's age persistent nausea will order CT imaging to further evaluate for any acute intra-abdominal process.  Clinical Course as of Jul 22 1948  Thu Jul 23, 2018  1947 Patient reassessed.  Symptoms improved.  CT imaging does show evidence of probable enteritis.  No evidence of obstruction or surgical pathology.  At this point do believe he stable and appropriate for outpatient follow-up.   [PR]    Clinical Course User Index [PR] Willy Eddy, MD    The patient was evaluated in Emergency Department today for the symptoms described in the history of present illness. He/she was evaluated in the context of the global COVID-19 pandemic, which necessitated consideration that the patient might be at risk for infection with the SARS-CoV-2 virus that causes COVID-19. Institutional protocols and algorithms that pertain to the evaluation of patients at risk for COVID-19 are in a state of rapid change based on information released by regulatory bodies including the CDC and federal and state organizations. These policies and  algorithms were followed during the patient's care in the ED.   As part of my medical decision making, I reviewed the following data within the electronic MEDICAL RECORD NUMBER Nursing notes reviewed and incorporated, Labs reviewed, notes from prior ED visits and [place] Controlled Substance Database   ____________________________________________   FINAL CLINICAL IMPRESSION(S) / ED DIAGNOSES  Final diagnoses:  Nausea  Dizziness      NEW MEDICATIONS STARTED DURING THIS VISIT:  New Prescriptions   ONDANSETRON (ZOFRAN ODT) 4 MG DISINTEGRATING TABLET    Take 1 tablet (4 mg total) by mouth every 8 (eight) hours as needed for nausea or vomiting.     Note:  This document was prepared using Dragon voice recognition software and may include unintentional dictation errors.    Willy Eddy, MD 07/23/18 1950

## 2018-07-23 NOTE — Telephone Encounter (Signed)
Patient called stating he called 911 and EMS came and said his vitals were fine. They did an EKG and said it was fine too. [place] Spaniel they offered to take him to hospital to check his enzymes to see if he had a heart attack and he declined saying he does not want to be exposed to COVID 19...   Spoke with Dr Judithann Graves and informed patient she still thinks its best to go to ER. It still concerns her that its his heart. The EKG may have been fine then but not earlier that morning or the night before when now he is saying he had dizziness and his BP dropped. He verbalized understanding and said he will go to the hospital when his wife wakes up.Marland KitchenMarland Kitchen

## 2018-09-11 LAB — FECAL OCCULT BLOOD, GUAIAC: Fecal Occult Blood: NEGATIVE

## 2018-10-01 ENCOUNTER — Encounter: Payer: Self-pay | Admitting: Internal Medicine

## 2018-11-18 DIAGNOSIS — I2510 Atherosclerotic heart disease of native coronary artery without angina pectoris: Secondary | ICD-10-CM | POA: Diagnosis not present

## 2018-11-18 DIAGNOSIS — I10 Essential (primary) hypertension: Secondary | ICD-10-CM | POA: Diagnosis not present

## 2018-11-18 DIAGNOSIS — E785 Hyperlipidemia, unspecified: Secondary | ICD-10-CM | POA: Diagnosis not present

## 2018-11-18 DIAGNOSIS — Z79899 Other long term (current) drug therapy: Secondary | ICD-10-CM | POA: Diagnosis not present

## 2018-12-01 DIAGNOSIS — H4089 Other specified glaucoma: Secondary | ICD-10-CM | POA: Diagnosis not present

## 2019-01-07 IMAGING — CR DG CHEST 2V
3 series · 4 of 4 positions shown · non-contrast
Comparison: Radiographs July 12, 2016.

CLINICAL DATA: Cough, fever.

EXAM:
CHEST - 2 VIEW

[chest pa (1 of 2)]
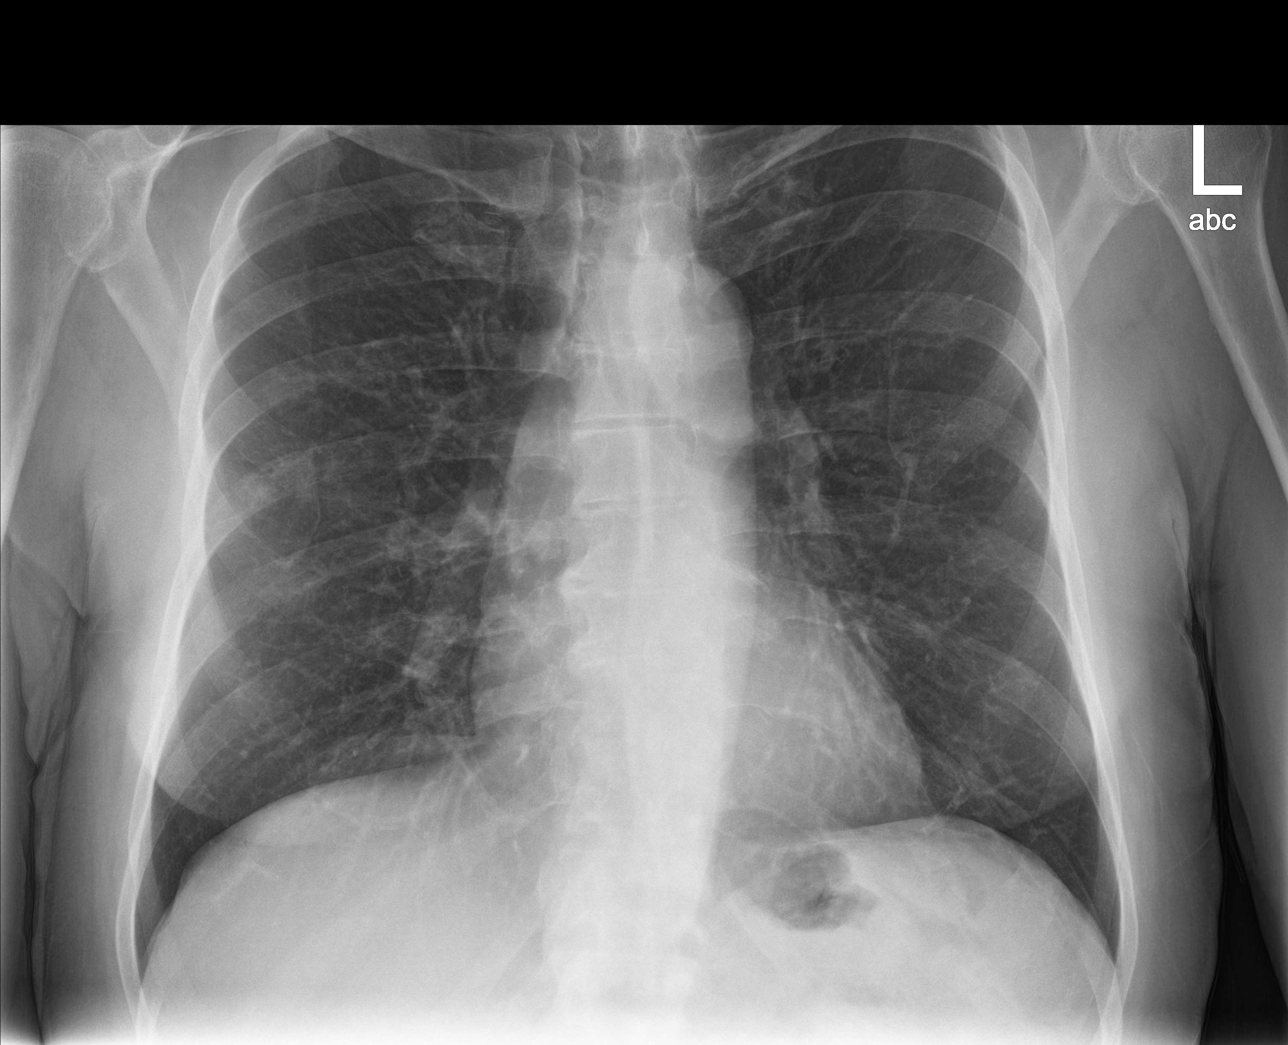

[Series 2: chest lat · 0.14mm/px · 2 of 2 slices shown]
[im 1/2]
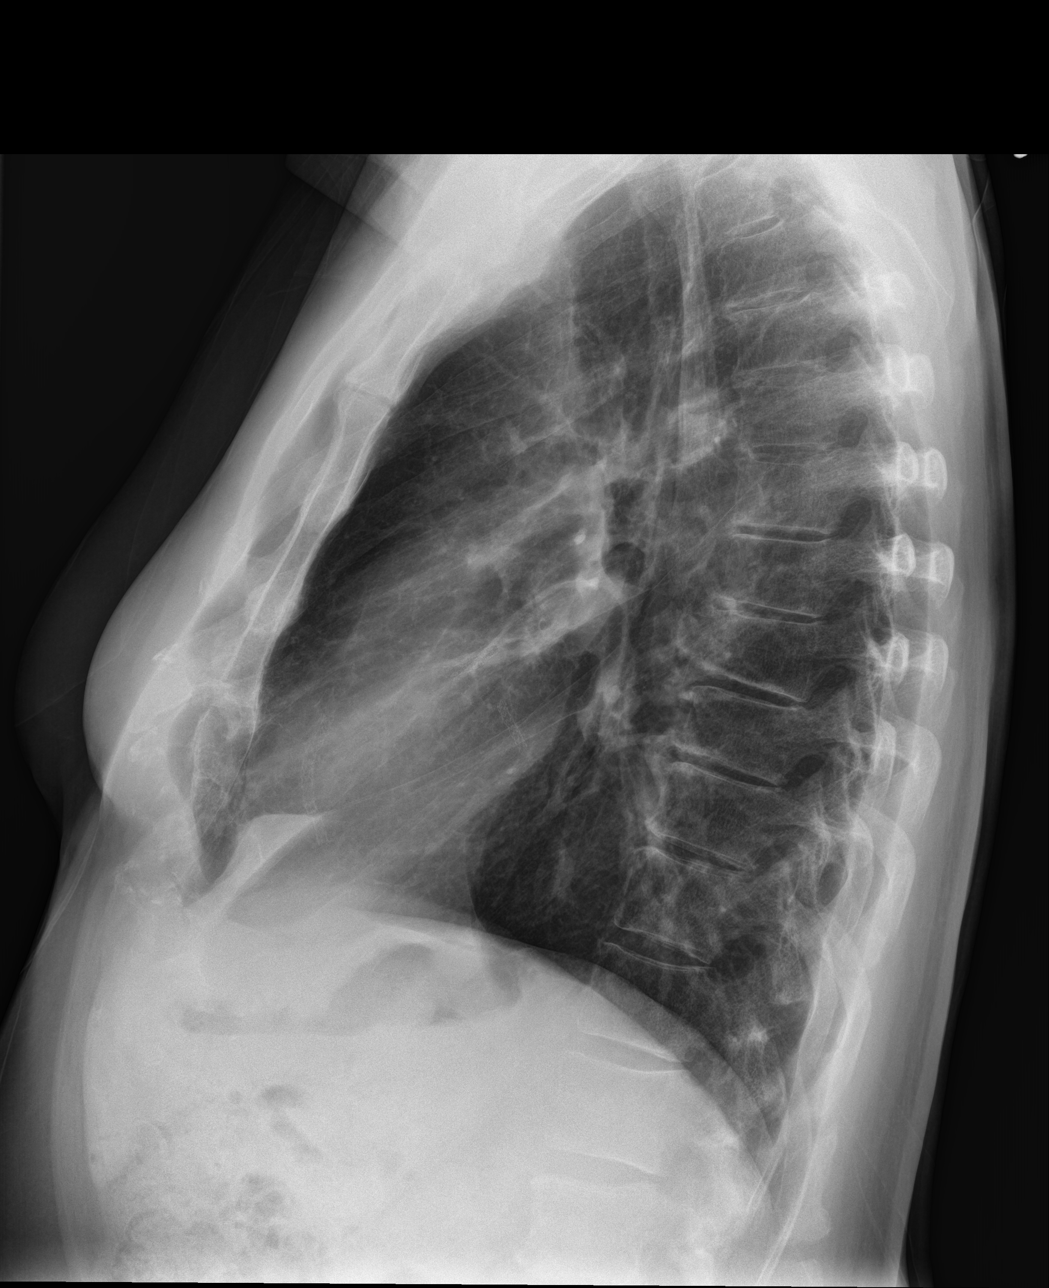
[im 2/2]
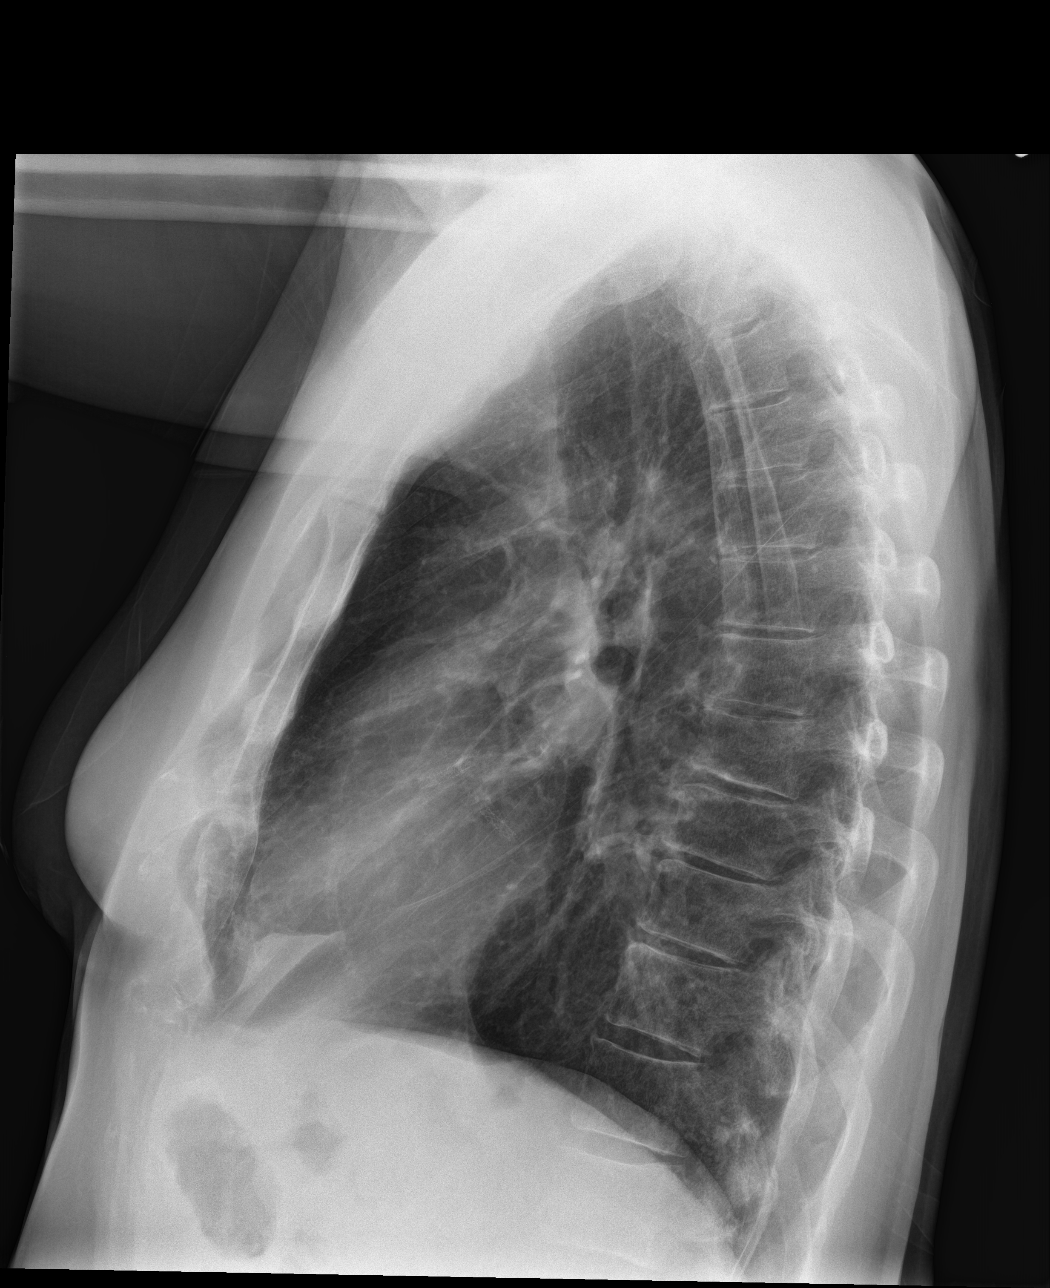

[chest pa (2 of 2)]
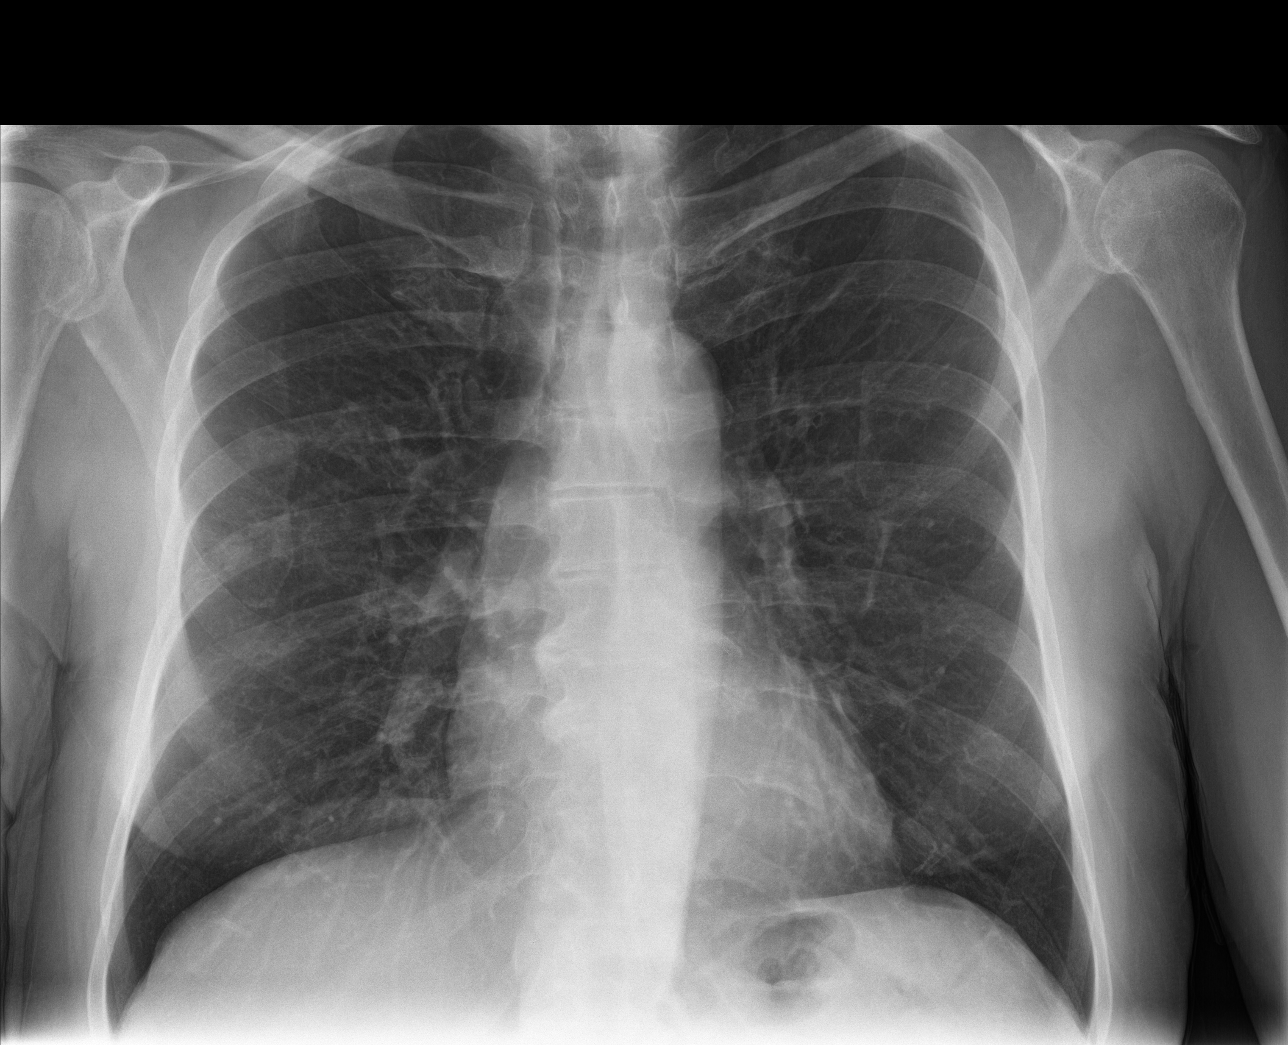

[4 of 4 positions shown; findings below may reference images not displayed]

FINDINGS: The heart size and mediastinal contours are within normal limits. No
pneumothorax or pleural effusion is noted. Left lung is clear.
Stable nodular density is noted laterally in right lung. The
visualized skeletal structures are unremarkable.
IMPRESSION: Stable nodular density seen laterally in right lung. CT scan of the
chest is recommended to rule out pulmonary nodule or neoplasm. These
results will be called to the ordering clinician or representative
by the Radiologist Assistant, and communication documented in the
PACS or zVision Dashboard.

## 2019-02-23 DIAGNOSIS — L280 Lichen simplex chronicus: Secondary | ICD-10-CM | POA: Diagnosis not present

## 2019-04-19 ENCOUNTER — Ambulatory Visit: Payer: Self-pay

## 2019-05-03 ENCOUNTER — Ambulatory Visit (INDEPENDENT_AMBULATORY_CARE_PROVIDER_SITE_OTHER): Payer: Medicare PPO

## 2019-05-03 VITALS — Ht 71.0 in | Wt 148.4 lb

## 2019-05-03 DIAGNOSIS — Z0000 Encounter for general adult medical examination without abnormal findings: Secondary | ICD-10-CM

## 2019-05-03 NOTE — Patient Instructions (Signed)
Dominic Mcmillan , Thank you for taking time to come for your Medicare Wellness Visit. I appreciate your ongoing commitment to your health goals. Please review the following plan we discussed and let me know if I can assist you in the future.   Screening recommendations/referrals: Colonoscopy: no longer required Recommended yearly ophthalmology/optometry visit for glaucoma screening and checkup Recommended yearly dental visit for hygiene and checkup  Vaccinations: Influenza vaccine: done 02/01/19 Pneumococcal vaccine: done 07/21/15 Tdap vaccine: done 03/30/11 Shingles vaccine: Shingrix completed 12/30/16    Advanced directives: Please bring a copy of your health care power of attorney and living will to the office at your convenience.  Conditions/risks identified: Keep up the great work!  Next appointment: Please follow up in one year for your Medicare Annual Wellness visit.    Preventive Care 38 Years and Older, Male Preventive care refers to lifestyle choices and visits with your health care provider that can promote health and wellness. What does preventive care include?  A yearly physical exam. This is also called an annual well check.  Dental exams once or twice a year.  Routine eye exams. Ask your health care provider how often you should have your eyes checked.  Personal lifestyle choices, including:  Daily care of your teeth and gums.  Regular physical activity.  Eating a healthy diet.  Avoiding tobacco and drug use.  Limiting alcohol use.  Practicing safe sex.  Taking low doses of aspirin every day.  Taking vitamin and mineral supplements as recommended by your health care provider. What happens during an annual well check? The services and screenings done by your health care provider during your annual well check will depend on your age, overall health, lifestyle risk factors, and family history of disease. Counseling  Your health care provider may ask you  questions about your:  Alcohol use.  Tobacco use.  Drug use.  Emotional well-being.  Home and relationship well-being.  Sexual activity.  Eating habits.  History of falls.  Memory and ability to understand (cognition).  Work and work Astronomer. Screening  You may have the following tests or measurements:  Height, weight, and BMI.  Blood pressure.  Lipid and cholesterol levels. These may be checked every 5 years, or more frequently if you are over 40 years old.  Skin check.  Lung cancer screening. You may have this screening every year starting at age 53 if you have a 30-pack-year history of smoking and currently smoke or have quit within the past 15 years.  Fecal occult blood test (FOBT) of the stool. You may have this test every year starting at age 2.  Flexible sigmoidoscopy or colonoscopy. You may have a sigmoidoscopy every 5 years or a colonoscopy every 10 years starting at age 67.  Prostate cancer screening. Recommendations will vary depending on your family history and other risks.  Hepatitis C blood test.  Hepatitis B blood test.  Sexually transmitted disease (STD) testing.  Diabetes screening. This is done by checking your blood sugar (glucose) after you have not eaten for a while (fasting). You may have this done every 1-3 years.  Abdominal aortic aneurysm (AAA) screening. You may need this if you are a current or former smoker.  Osteoporosis. You may be screened starting at age 64 if you are at high risk. Talk with your health care provider about your test results, treatment options, and if necessary, the need for more tests. Vaccines  Your health care provider may recommend certain vaccines, such as:  Influenza vaccine. This is recommended every year.  Tetanus, diphtheria, and acellular pertussis (Tdap, Td) vaccine. You may need a Td booster every 10 years.  Zoster vaccine. You may need this after age 19.  Pneumococcal 13-valent conjugate  (PCV13) vaccine. One dose is recommended after age 33.  Pneumococcal polysaccharide (PPSV23) vaccine. One dose is recommended after age 84. Talk to your health care provider about which screenings and vaccines you need and how often you need them. This information is not intended to replace advice given to you by your health care provider. Make sure you discuss any questions you have with your health care provider. Document Released: 05/12/2015 Document Revised: 01/03/2016 Document Reviewed: 02/14/2015 Elsevier Interactive Patient Education  2017 [employer] Prevention in the Home Falls can cause injuries. They can happen to people of all ages. There are many things you can do to make your home safe and to help prevent falls. What can I do on the outside of my home?  Regularly fix the edges of walkways and driveways and fix any cracks.  Remove anything that might make you trip as you walk through a door, such as a raised step or threshold.  Trim any bushes or trees on the path to your home.  Use bright outdoor lighting.  Clear any walking paths of anything that might make someone trip, such as rocks or tools.  Regularly check to see if handrails are loose or broken. Make sure that both sides of any steps have handrails.  Any raised decks and porches should have guardrails on the edges.  Have any leaves, snow, or ice cleared regularly.  Use sand or salt on walking paths during winter.  Clean up any spills in your garage right away. This includes oil or grease spills. What can I do in the bathroom?  Use night lights.  Install grab bars by the toilet and in the tub and shower. Do not use towel bars as grab bars.  Use non-skid mats or decals in the tub or shower.  If you need to sit down in the shower, use a plastic, non-slip stool.  Keep the floor dry. Clean up any water that spills on the floor as soon as it happens.  Remove soap buildup in the tub or shower  regularly.  Attach bath mats securely with double-sided non-slip rug tape.  Do not have throw rugs and other things on the floor that can make you trip. What can I do in the bedroom?  Use night lights.  Make sure that you have a light by your bed that is easy to reach.  Do not use any sheets or blankets that are too big for your bed. They should not hang down onto the floor.  Have a firm chair that has side arms. You can use this for support while you get dressed.  Do not have throw rugs and other things on the floor that can make you trip. What can I do in the kitchen?  Clean up any spills right away.  Avoid walking on wet floors.  Keep items that you use a lot in easy-to-reach places.  If you need to reach something above you, use a strong step stool that has a grab bar.  Keep electrical cords out of the way.  Do not use floor polish or wax that makes floors slippery. If you must use wax, use non-skid floor wax.  Do not have throw rugs and other things on the floor that  can make you trip. What can I do with my stairs?  Do not leave any items on the stairs.  Make sure that there are handrails on both sides of the stairs and use them. Fix handrails that are broken or loose. Make sure that handrails are as long as the stairways.  Check any carpeting to make sure that it is firmly attached to the stairs. Fix any carpet that is loose or worn.  Avoid having throw rugs at the top or bottom of the stairs. If you do have throw rugs, attach them to the floor with carpet tape.  Make sure that you have a light switch at the top of the stairs and the bottom of the stairs. If you do not have them, ask someone to add them for you. What else can I do to help prevent falls?  Wear shoes that:  Do not have high heels.  Have rubber bottoms.  Are comfortable and fit you well.  Are closed at the toe. Do not wear sandals.  If you use a stepladder:  Make sure that it is fully  opened. Do not climb a closed stepladder.  Make sure that both sides of the stepladder are locked into place.  Ask someone to hold it for you, if possible.  Clearly mark and make sure that you can see:  Any grab bars or handrails.  First and last steps.  Where the edge of each step is.  Use tools that help you move around (mobility aids) if they are needed. These include:  Canes.  Walkers.  Scooters.  Crutches.  Turn on the lights when you go into a dark area. Replace any light bulbs as soon as they burn out.  Set up your furniture so you have a clear path. Avoid moving your furniture around.  If any of your floors are uneven, fix them.  If there are any pets around you, be aware of where they are.  Review your medicines with your doctor. Some medicines can make you feel dizzy. This can increase your chance of falling. Ask your doctor what other things that you can do to help prevent falls. This information is not intended to replace advice given to you by your health care provider. Make sure you discuss any questions you have with your health care provider. Document Released: 02/09/2009 Document Revised: 09/21/2015 Document Reviewed: 05/20/2014 Elsevier Interactive Patient Education  2017 [employer].

## 2019-05-03 NOTE — Progress Notes (Signed)
Subjective:   Dominic Mcmillan is a 74 y.o. male who presents for Medicare Annual/Subsequent preventive examination.  Virtual Visit via Telephone Note  I connected with Dominic Mcmillan on 05/03/19 at 10:40 AM EST by telephone and verified that I am speaking with the correct person using two identifiers.  Medicare Annual Wellness visit completed telephonically due to Covid-19 pandemic.   Location: Patient: home Provider: office   I discussed the limitations, risks, security and privacy concerns of performing an evaluation and management service by telephone and the availability of in person appointments. The patient expressed understanding and agreed to proceed.  Some vital signs may be absent or patient reported.   Reather Littler, LPN    Review of Systems:   Cardiac Risk Factors include: advanced age (>53men, >14 women);male gender;hypertension     Objective:    Vitals: Ht 5\' 11"  (1.803 m)   Wt 148 lb 6.4 oz (67.3 kg)   BMI 20.70 kg/m   Body mass index is 20.7 kg/m.  Advanced Directives 05/03/2019 07/23/2018 04/13/2018 02/20/2017 06/04/2016 09/12/2015 09/11/2015  Does Patient Have a Medical Advance Directive? Yes No No No No No No  Type of 09/13/2015 of [place];Living will - - - - - -  Copy of Healthcare Power of Attorney in Chart? No - copy requested - - - - - -  Would patient like information on creating a medical advance directive? - No - Patient declined No - Patient declined No - Patient declined No - Patient declined No - patient declined information No - patient declined information    Tobacco Social History   Tobacco Use  Smoking Status Never Smoker  Smokeless Tobacco Never Used     Counseling given: Not Answered   Clinical Intake:  Pre-visit preparation completed: Yes  Pain : 0-10 Pain Score: 4  Pain Type: Chronic pain Pain Location: Back Pain Orientation: Upper Pain Descriptors / Indicators: Aching, Discomfort,  Constant Pain Onset: More than a month ago Pain Frequency: Constant     BMI - recorded: 20.7 Nutritional Status: BMI of 19-24  Normal Nutritional Risks: None Diabetes: No  How often do you need to have someone help you when you read instructions, pamphlets, or other written materials from your doctor or pharmacy?: 1 - Never  Interpreter Needed?: No  Information entered by :: [phone] LPN  Past Medical History:  Diagnosis Date  . Acute MI (HCC)   . CAD (coronary artery disease)   . Glaucoma   . HLD (hyperlipidemia)   . HTN (hypertension)   . PTSD (post-traumatic stress disorder)    Past Surgical History:  Procedure Laterality Date  . CARDIAC CATHETERIZATION N/A 09/12/2015   Procedure: Left Heart Cath and Coronary Angiography;  Surgeon: 09/14/2015, MD;  Location: [facility] INVASIVE CV LAB;  Service: Cardiovascular;  Laterality: N/A;  . CATARACT EXTRACTION W/ INTRAOCULAR LENS IMPLANT Left 05/09/2017  . CERVICAL SPINE SURGERY    . COLONOSCOPY  2008  . CORONARY ANGIOPLASTY WITH STENT PLACEMENT    . CORONARY ANGIOPLASTY WITH STENT PLACEMENT    . NASAL SINUS SURGERY    . SALIVARY GLAND SURGERY Right 2011   oncocytoma  . TONSILLECTOMY     Family History  Problem Relation Age of Onset  . Hypertension Mother   . Stroke Mother   . Congestive Heart Failure Father    Social History   Socioeconomic History  . Marital status: Married    Spouse name: Not on file  .  Number of children: 2  . Years of education: Not on file  . Highest education level: Some college, no degree  Occupational History  . Occupation: retired  Tobacco Use  . Smoking status: Never Smoker  . Smokeless tobacco: Never Used  Substance and Sexual Activity  . Alcohol use: No    Alcohol/week: 0.0 standard drinks  . Drug use: No  . Sexual activity: Not on file  Other Topics Concern  . Not on file  Social History Narrative   Lives at home with wife, independent at baseline   Social Determinants of  Health   Financial Resource Strain: Low Risk   . Difficulty of Paying Living Expenses: Not hard at all  Food Insecurity: No Food Insecurity  . Worried About Programme researcher, broadcasting/film/video in the Last Year: Never true  . Ran Out of Food in the Last Year: Never true  Transportation Needs: Unmet Transportation Needs  . Lack of Transportation (Medical): Yes  . Lack of Transportation (Non-Medical): Yes  Physical Activity: Inactive  . Days of Exercise per Week: 0 days  . Minutes of Exercise per Session: 0 min  Stress: No Stress Concern Present  . Feeling of Stress : Not at all  Social Connections: Somewhat Isolated  . Frequency of Communication with Friends and Family: More than three times a week  . Frequency of Social Gatherings with Friends and Family: Never  . Attends Religious Services: Never  . Active Member of Clubs or Organizations: No  . Attends Banker Meetings: Never  . Marital Status: Married    Outpatient Encounter Medications as of 05/03/2019  Medication Sig  . aspirin (ECOTRIN LOW STRENGTH) 81 MG EC tablet Take 1 tablet by mouth daily.  . Cholecalciferol (HM VITAMIN D3) 2000 units CAPS Take 1 capsule by mouth daily.   . clopidogrel (PLAVIX) 75 MG tablet Take 1 tablet by mouth daily.  Marland Kitchen co-enzyme Q-10 30 MG capsule Take 30 mg by mouth 3 (three) times daily.  . dorzolamide-timolol (COSOPT) 22.3-6.8 MG/ML ophthalmic solution Place 1 drop into the right eye 2 (two) times daily.  . famotidine (PEPCID) 10 MG tablet Take 10 mg by mouth 2 (two) times daily.  . hydrochlorothiazide (HYDRODIURIL) 25 MG tablet Take 1 tablet by mouth daily.  Marland Kitchen HYDROcodone-acetaminophen (NORCO/VICODIN) 5-325 MG tablet Take 1 tablet by mouth every 6 (six) hours as needed for moderate pain.  Marland Kitchen ketoconazole (NIZORAL) 2 % shampoo KETOCONAZOLE, 2% (External Shampoo) - Historical Medication  (2 %) Active  . latanoprost (XALATAN) 0.005 % ophthalmic solution Place 1 drop into both eyes at bedtime.   Marland Kitchen  LORazepam (ATIVAN) 1 MG tablet Take 1 mg by mouth 2 (two) times daily.  . Multiple Vitamins-Minerals (CENTRUM SILVER) tablet Take 1 tablet by mouth daily.   . nitroGLYCERIN (NITROSTAT) 0.4 MG SL tablet Place under the tongue.  Marland Kitchen olmesartan (BENICAR) 40 MG tablet Take 1 tablet by mouth daily.  . rosuvastatin (CRESTOR) 40 MG tablet Take 20 mg by mouth daily.  . vitamin C (ASCORBIC ACID) 500 MG tablet Take 1,000 mg by mouth daily.   . [DISCONTINUED] DORZOLAMIDE HCL-TIMOLOL MAL OP Place 1 drop into the left eye 2 (two) times daily.   . [DISCONTINUED] fluticasone (FLOVENT HFA) 110 MCG/ACT inhaler Inhale 2 puffs into the lungs 2 (two) times daily.  . [DISCONTINUED] olmesartan (BENICAR) 20 MG tablet Take 1 tablet by mouth daily.  . [DISCONTINUED] ondansetron (ZOFRAN ODT) 4 MG disintegrating tablet Take 1 tablet (4 mg total) by  mouth every 8 (eight) hours as needed for nausea or vomiting.  . [DISCONTINUED] pseudoephedrine-guaifenesin (TUSSIN PE) 30-100 MG/5ML SYRP Take 5 mLs by mouth every 4 (four) hours as needed.   No facility-administered encounter medications on file as of 05/03/2019.    Activities of Daily Living In your present state of health, do you have any difficulty performing the following activities: 05/03/2019  Hearing? Y  Comment wears hearing aids  Vision? N  Difficulty concentrating or making decisions? N  Walking or climbing stairs? N  Dressing or bathing? N  Doing errands, shopping? N  Preparing Food and eating ? N  Using the Toilet? N  In the past six months, have you accidently leaked urine? N  Do you have problems with loss of bowel control? N  Managing your Medications? N  Managing your Finances? N  Housekeeping or managing your Housekeeping? N  Some recent data might be hidden    Patient Care Team: Reubin Milan, MD as PCP - General (Internal Medicine) [facility] (Internal Medicine)   Assessment:   This is a routine wellness examination for Dominic Mcmillan.  Exercise  Activities and Dietary recommendations Current Exercise Habits: Home exercise routine, Type of exercise: Other - see comments(walks up and down stairs), Intensity: Mild, Exercise limited by: orthopedic condition(s)  Goals    . Exercise 150 minutes per week (moderate activity)     Continue to remain active       Fall Risk Fall Risk  05/03/2019 04/13/2018 02/27/2018 02/20/2017 08/02/2015  Falls in the past year? 0 0 0 No No  Number falls in past yr: 0 0 0 - -  Injury with Fall? 0 - 0 - -  Risk for fall due to : No Fall Risks - - - -  Follow up Falls prevention discussed - - - -   FALL RISK PREVENTION PERTAINING TO THE HOME:  Any stairs in or around the home? Yes  If so, do they handrails? Yes   Home free of loose throw rugs in walkways, pet beds, electrical cords, etc? Yes  Adequate lighting in your home to reduce risk of falls? Yes   ASSISTIVE DEVICES UTILIZED TO PREVENT FALLS:  Life alert? No  Use of a cane, walker or w/c? No  Grab bars in the bathroom? No  Shower chair or bench in shower? Yes  Elevated toilet seat or a handicapped toilet? No   DME ORDERS:  DME order needed?  No   TIMED UP AND GO:  Was the test performed? No . Telephonic visit.   Education: Fall risk prevention has been discussed.  Intervention(s) required? No   Depression Screen PHQ 2/9 Scores 05/03/2019 04/13/2018 02/27/2018 02/20/2017  PHQ - 2 Score 0 0 0 0    Cognitive Function - 6CIT deferred for 2020 AWV, pt has no memory issues.         Immunization History  Administered Date(s) Administered  . Influenza, High Dose Seasonal PF 12/26/2016  . Influenza-Unspecified 12/28/2013, 12/22/2017, 02/01/2019  . Pneumococcal Conjugate-13 05/14/2013  . Pneumococcal Polysaccharide-23 09/27/2008, 07/21/2015  . Tdap 03/30/2011  . Zoster 04/30/2008  . Zoster Recombinat (Shingrix) 07/30/2016, 12/30/2016    Qualifies for Shingles Vaccine? Yes  Shingrix series completed.   Tdap: Up to date  Flu  Vaccine: Up to date  Pneumococcal Vaccine: Due for Pneumococcal vaccine. Does the patient want to receive this vaccine today?  No . Education has been provided regarding the importance of this vaccine but still declined. Advised may receive  this vaccine at local pharmacy or Health Dept. Aware to provide a copy of the vaccination record if obtained from local pharmacy or Health Dept. Verbalized acceptance and understanding.   Screening Tests Health Maintenance  Topic Date Due  . Hepatitis C Screening  [birth year]  . COLONOSCOPY  10/01/1993  . COLON CANCER SCREENING ANNUAL FOBT  09/11/2019  . TETANUS/TDAP  03/29/2021  . INFLUENZA VACCINE  Completed  . PNA vac Low Risk Adult  Completed   Cancer Screenings:  Colorectal Screening: Completed 2008.  No longer required.  Lung Cancer Screening: (Low Dose CT Chest recommended if Age 69-80 years, 30 pack-year currently smoking OR have quit w/in 15years.) does not qualify.   Additional Screening:  Hepatitis C Screening: does qualify; postponed  Vision Screening: Recommended annual ophthalmology exams for early detection of glaucoma and other disorders of the eye. Is the patient up to date with their annual eye exam?  Yes  Who is the provider or what is the name of the office in which the pt attends annual eye exams? Dr. Leanora Ivanoff  Dental Screening: Recommended annual dental exams for proper oral hygiene  Community Resource Referral:  CRR required this visit?  No       Plan:    I have personally reviewed and addressed the Medicare Annual Wellness questionnaire and have noted the following in the patient's chart:  A. Medical and social history B. Use of alcohol, tobacco or illicit drugs  C. Current medications and supplements D. Functional ability and status E.  Nutritional status F.  Physical activity G. Advance directives H. List of other physicians I.  Hospitalizations, surgeries, and ER visits in previous 12 months J.   [place] such as hearing and vision if needed, cognitive and depression L. Referrals and appointments   In addition, I have reviewed and discussed with patient certain preventive protocols, quality metrics, and best practice recommendations. A written personalized care plan for preventive services as well as general preventive health recommendations were provided to patient.   Signed,  Clemetine Marker, LPN Nurse Health Advisor   Nurse Notes: pt doing well and appreciative of visit today

## 2019-07-09 DIAGNOSIS — H401123 Primary open-angle glaucoma, left eye, severe stage: Secondary | ICD-10-CM | POA: Diagnosis not present

## 2019-07-09 DIAGNOSIS — H401111 Primary open-angle glaucoma, right eye, mild stage: Secondary | ICD-10-CM | POA: Diagnosis not present

## 2019-07-09 DIAGNOSIS — H26493 Other secondary cataract, bilateral: Secondary | ICD-10-CM | POA: Diagnosis not present

## 2019-08-19 DIAGNOSIS — L720 Epidermal cyst: Secondary | ICD-10-CM | POA: Diagnosis not present

## 2019-08-19 DIAGNOSIS — L578 Other skin changes due to chronic exposure to nonionizing radiation: Secondary | ICD-10-CM | POA: Diagnosis not present

## 2019-08-19 DIAGNOSIS — Z86018 Personal history of other benign neoplasm: Secondary | ICD-10-CM | POA: Diagnosis not present

## 2019-08-19 DIAGNOSIS — Z872 Personal history of diseases of the skin and subcutaneous tissue: Secondary | ICD-10-CM | POA: Diagnosis not present

## 2019-08-19 DIAGNOSIS — Z85828 Personal history of other malignant neoplasm of skin: Secondary | ICD-10-CM | POA: Diagnosis not present

## 2019-08-19 DIAGNOSIS — L400 Psoriasis vulgaris: Secondary | ICD-10-CM | POA: Diagnosis not present

## 2019-08-19 DIAGNOSIS — L570 Actinic keratosis: Secondary | ICD-10-CM | POA: Diagnosis not present

## 2019-08-19 DIAGNOSIS — L821 Other seborrheic keratosis: Secondary | ICD-10-CM | POA: Diagnosis not present

## 2019-08-31 ENCOUNTER — Encounter: Payer: Self-pay | Admitting: Internal Medicine

## 2019-11-01 ENCOUNTER — Encounter: Payer: Self-pay | Admitting: Internal Medicine

## 2019-11-03 ENCOUNTER — Encounter: Payer: Self-pay | Admitting: Internal Medicine

## 2019-11-03 NOTE — Telephone Encounter (Signed)
Please Advise.  KP

## 2019-11-09 ENCOUNTER — Encounter: Payer: Self-pay | Admitting: Internal Medicine

## 2019-12-22 DIAGNOSIS — I2510 Atherosclerotic heart disease of native coronary artery without angina pectoris: Secondary | ICD-10-CM | POA: Diagnosis not present

## 2020-04-28 ENCOUNTER — Encounter: Payer: Self-pay | Admitting: Internal Medicine

## 2020-05-03 ENCOUNTER — Ambulatory Visit (INDEPENDENT_AMBULATORY_CARE_PROVIDER_SITE_OTHER): Payer: Medicare PPO

## 2020-05-03 DIAGNOSIS — Z0000 Encounter for general adult medical examination without abnormal findings: Secondary | ICD-10-CM | POA: Diagnosis not present

## 2020-05-03 NOTE — Patient Instructions (Signed)
[name] , Thank you for taking time to come for your Medicare Wellness Visit. I appreciate your ongoing commitment to your health goals. Please review the following plan we discussed and let me know if I can assist you in the future.   Screening recommendations/referrals: Colonoscopy: no longer required Recommended yearly ophthalmology/optometry visit for glaucoma screening and checkup Recommended yearly dental visit for hygiene and checkup  Vaccinations: Influenza vaccine: done 01/09/20 Pneumococcal vaccine: done 07/21/15 Tdap vaccine: done 03/30/11 Shingles vaccine: done 07/30/16 & 12/30/16   Covid-19: done 05/05/19, 05/27/19 & 01/26/20  Advanced directives: Please bring a copy of your health care power of attorney and living will to the office at your convenience.  Conditions/risks identified: Keep up the great work!  Next appointment: Follow up in one year for your annual wellness visit.   Preventive Care 14 Years and Older, Male Preventive care refers to lifestyle choices and visits with your health care provider that can promote health and wellness. What does preventive care include?  A yearly physical exam. This is also called an annual well check.  Dental exams once or twice a year.  Routine eye exams. Ask your health care provider how often you should have your eyes checked.  Personal lifestyle choices, including:  Daily care of your teeth and gums.  Regular physical activity.  Eating a healthy diet.  Avoiding tobacco and drug use.  Limiting alcohol use.  Practicing safe sex.  Taking low doses of aspirin every day.  Taking vitamin and mineral supplements as recommended by your health care provider. What happens during an annual well check? The services and screenings done by your health care provider during your annual well check will depend on your age, overall health, lifestyle risk factors, and family history of disease. Counseling  Your health care  provider may ask you questions about your:  Alcohol use.  Tobacco use.  Drug use.  Emotional well-being.  Home and relationship well-being.  Sexual activity.  Eating habits.  History of falls.  Memory and ability to understand (cognition).  Work and work Astronomer. Screening  You may have the following tests or measurements:  Height, weight, and BMI.  Blood pressure.  Lipid and cholesterol levels. These may be checked every 5 years, or more frequently if you are over 80 years old.  Skin check.  Lung cancer screening. You may have this screening every year starting at age 26 if you have a 30-pack-year history of smoking and currently smoke or have quit within the past 15 years.  Fecal occult blood test (FOBT) of the stool. You may have this test every year starting at age 6.  Flexible sigmoidoscopy or colonoscopy. You may have a sigmoidoscopy every 5 years or a colonoscopy every 10 years starting at age 30.  Prostate cancer screening. Recommendations will vary depending on your family history and other risks.  Hepatitis C blood test.  Hepatitis B blood test.  Sexually transmitted disease (STD) testing.  Diabetes screening. This is done by checking your blood sugar (glucose) after you have not eaten for a while (fasting). You may have this done every 1-3 years.  Abdominal aortic aneurysm (AAA) screening. You may need this if you are a current or former smoker.  Osteoporosis. You may be screened starting at age 65 if you are at high risk. Talk with your health care provider about your test results, treatment options, and if necessary, the need for more tests. Vaccines  Your health care provider may recommend certain  vaccines, such as:  Influenza vaccine. This is recommended every year.  Tetanus, diphtheria, and acellular pertussis (Tdap, Td) vaccine. You may need a Td booster every 10 years.  Zoster vaccine. You may need this after age 30.  Pneumococcal  13-valent conjugate (PCV13) vaccine. One dose is recommended after age 101.  Pneumococcal polysaccharide (PPSV23) vaccine. One dose is recommended after age 16. Talk to your health care provider about which screenings and vaccines you need and how often you need them. This information is not intended to replace advice given to you by your health care provider. Make sure you discuss any questions you have with your health care provider. Document Released: 05/12/2015 Document Revised: 01/03/2016 Document Reviewed: 02/14/2015 Elsevier Interactive Patient Education  2017 [place] Prevention in the Home Falls can cause injuries. They can happen to people of all ages. There are many things you can do to make your home safe and to help prevent falls. What can I do on the outside of my home?  Regularly fix the edges of walkways and driveways and fix any cracks.  Remove anything that might make you trip as you walk through a door, such as a raised step or threshold.  Trim any bushes or trees on the path to your home.  Use bright outdoor lighting.  Clear any walking paths of anything that might make someone trip, such as rocks or tools.  Regularly check to see if handrails are loose or broken. Make sure that both sides of any steps have handrails.  Any raised decks and porches should have guardrails on the edges.  Have any leaves, snow, or ice cleared regularly.  Use sand or salt on walking paths during winter.  Clean up any spills in your garage right away. This includes oil or grease spills. What can I do in the bathroom?  Use night lights.  Install grab bars by the toilet and in the tub and shower. Do not use towel bars as grab bars.  Use non-skid mats or decals in the tub or shower.  If you need to sit down in the shower, use a plastic, non-slip stool.  Keep the floor dry. Clean up any water that spills on the floor as soon as it happens.  Remove soap buildup in the  tub or shower regularly.  Attach bath mats securely with double-sided non-slip rug tape.  Do not have throw rugs and other things on the floor that can make you trip. What can I do in the bedroom?  Use night lights.  Make sure that you have a light by your bed that is easy to reach.  Do not use any sheets or blankets that are too big for your bed. They should not hang down onto the floor.  Have a firm chair that has side arms. You can use this for support while you get dressed.  Do not have throw rugs and other things on the floor that can make you trip. What can I do in the kitchen?  Clean up any spills right away.  Avoid walking on wet floors.  Keep items that you use a lot in easy-to-reach places.  If you need to reach something above you, use a strong step stool that has a grab bar.  Keep electrical cords out of the way.  Do not use floor polish or wax that makes floors slippery. If you must use wax, use non-skid floor wax.  Do not have throw rugs and other things  on the floor that can make you trip. What can I do with my stairs?  Do not leave any items on the stairs.  Make sure that there are handrails on both sides of the stairs and use them. Fix handrails that are broken or loose. Make sure that handrails are as long as the stairways.  Check any carpeting to make sure that it is firmly attached to the stairs. Fix any carpet that is loose or worn.  Avoid having throw rugs at the top or bottom of the stairs. If you do have throw rugs, attach them to the floor with carpet tape.  Make sure that you have a light switch at the top of the stairs and the bottom of the stairs. If you do not have them, ask someone to add them for you. What else can I do to help prevent falls?  Wear shoes that:  Do not have high heels.  Have rubber bottoms.  Are comfortable and fit you well.  Are closed at the toe. Do not wear sandals.  If you use a stepladder:  Make sure that it  is fully opened. Do not climb a closed stepladder.  Make sure that both sides of the stepladder are locked into place.  Ask someone to hold it for you, if possible.  Clearly mark and make sure that you can see:  Any grab bars or handrails.  First and last steps.  Where the edge of each step is.  Use tools that help you move around (mobility aids) if they are needed. These include:  Canes.  Walkers.  Scooters.  Crutches.  Turn on the lights when you go into a dark area. Replace any light bulbs as soon as they burn out.  Set up your furniture so you have a clear path. Avoid moving your furniture around.  If any of your floors are uneven, fix them.  If there are any pets around you, be aware of where they are.  Review your medicines with your doctor. Some medicines can make you feel dizzy. This can increase your chance of falling. Ask your doctor what other things that you can do to help prevent falls. This information is not intended to replace advice given to you by your health care provider. Make sure you discuss any questions you have with your health care provider. Document Released: 02/09/2009 Document Revised: 09/21/2015 Document Reviewed: 05/20/2014 Elsevier Interactive Patient Education  2017 [employer].

## 2020-05-03 NOTE — Progress Notes (Signed)
Subjective:   Dominic Mcmillan is a 75 y.o. male who presents for Medicare Annual/Subsequent preventive examination.  Virtual Visit via Telephone Note  I connected with  Dominic Mcmillan on 05/03/20 at 10:40 AM EST by telephone and verified that I am speaking with the correct person using two identifiers.  Location: Patient: home Provider: [facility] Persons participating in the virtual visit: patient/Nurse Health Advisor   I discussed the limitations, risks, security and privacy concerns of performing an evaluation and management service by telephone and the availability of in person appointments. The patient expressed understanding and agreed to proceed.  Interactive audio and video telecommunications were attempted between this nurse and patient, however failed, due to patient having technical difficulties OR patient did not have access to video capability.  We continued and completed visit with audio only.  Some vital signs may be absent or patient reported.   Reather Littler, LPN    Review of Systems     Cardiac Risk Factors include: advanced age (>97men, >3 women);male gender;hypertension;dyslipidemia     Objective:    There were no vitals filed for this visit. There is no height or weight on file to calculate BMI.  Advanced Directives 05/03/2020 05/03/2019 07/23/2018 04/13/2018 02/20/2017 06/04/2016 09/12/2015  Does Patient Have a Medical Advance Directive? Yes Yes No No No No No  Type of Estate agent of [place];Living will Healthcare Power of [place];Living will - - - - -  Copy of Healthcare Power of Attorney in Chart? No - copy requested No - copy requested - - - - -  Would patient like information on creating a medical advance directive? - - No - Patient declined No - Patient declined No - Patient declined No - Patient declined No - patient declined information    Current Medications (verified) Outpatient Encounter Medications as of 05/03/2020   Medication Sig  . aspirin 81 MG EC tablet Take 1 tablet by mouth daily.  . Cholecalciferol 50 MCG (2000 UT) CAPS Take 1 capsule by mouth daily.   . clopidogrel (PLAVIX) 75 MG tablet Take 1 tablet by mouth daily.  Marland Kitchen co-enzyme Q-10 30 MG capsule Take 30 mg by mouth 3 (three) times daily.  . dorzolamide-timolol (COSOPT) 22.3-6.8 MG/ML ophthalmic solution Place 1 drop into the right eye 2 (two) times daily.  . hydrochlorothiazide (HYDRODIURIL) 25 MG tablet Take 1 tablet by mouth daily.  Marland Kitchen HYDROcodone-acetaminophen (NORCO/VICODIN) 5-325 MG tablet Take 1 tablet by mouth every 6 (six) hours as needed for moderate pain.  Marland Kitchen ketoconazole (NIZORAL) 2 % shampoo KETOCONAZOLE, 2% (External Shampoo) - Historical Medication  (2 %) Active  . latanoprost (XALATAN) 0.005 % ophthalmic solution Place 1 drop into both eyes at bedtime.   Marland Kitchen LORazepam (ATIVAN) 1 MG tablet Take 1 mg by mouth 2 (two) times daily.  . Multiple Vitamins-Minerals (CENTRUM SILVER) tablet Take 1 tablet by mouth daily.   . nitroGLYCERIN (NITROSTAT) 0.4 MG SL tablet Place under the tongue.  Marland Kitchen olmesartan (BENICAR) 40 MG tablet Take 1 tablet by mouth daily.  . rosuvastatin (CRESTOR) 40 MG tablet Take 20 mg by mouth daily.  . vitamin C (ASCORBIC ACID) 500 MG tablet Take 1,000 mg by mouth daily.   . [DISCONTINUED] famotidine (PEPCID) 10 MG tablet Take 10 mg by mouth 2 (two) times daily.   No facility-administered encounter medications on file as of 05/03/2020.    Allergies (verified) Nortriptyline hcl, Prazosin hcl, Quetiapine fumarate, Tizanidine, and Imdur [isosorbide dinitrate]   History: Past Medical  History:  Diagnosis Date  . Acute MI (HCC)   . CAD (coronary artery disease)   . Glaucoma   . HLD (hyperlipidemia)   . HTN (hypertension)   . PTSD (post-traumatic stress disorder)    Past Surgical History:  Procedure Laterality Date  . CARDIAC CATHETERIZATION N/A 09/12/2015   Procedure: Left Heart Cath and Coronary Angiography;   Surgeon: Lamar Blinks, MD;  Location: [facility] INVASIVE CV LAB;  Service: Cardiovascular;  Laterality: N/A;  . CATARACT EXTRACTION W/ INTRAOCULAR LENS IMPLANT Left 05/09/2017  . CERVICAL SPINE SURGERY    . COLONOSCOPY  2008  . CORONARY ANGIOPLASTY WITH STENT PLACEMENT    . CORONARY ANGIOPLASTY WITH STENT PLACEMENT    . NASAL SINUS SURGERY    . SALIVARY GLAND SURGERY Right 2011   oncocytoma  . TONSILLECTOMY     Family History  Problem Relation Age of Onset  . Hypertension Mother   . Stroke Mother   . Congestive Heart Failure Father    Social History   Socioeconomic History  . Marital status: Married    Spouse name: Not on file  . Number of children: 2  . Years of education: Not on file  . Highest education level: Some college, no degree  Occupational History  . Occupation: retired  Tobacco Use  . Smoking status: Never Smoker  . Smokeless tobacco: Never Used  Vaping Use  . Vaping Use: Never used  Substance and Sexual Activity  . Alcohol use: No    Alcohol/week: 0.0 standard drinks  . Drug use: No  . Sexual activity: Not on file  Other Topics Concern  . Not on file  Social History Narrative   Lives at home with wife, independent at baseline   Social Determinants of Health   Financial Resource Strain: Low Risk   . Difficulty of Paying Living Expenses: Not hard at all  Food Insecurity: No Food Insecurity  . Worried About Programme researcher, broadcasting/film/video in the Last Year: Never true  . Ran Out of Food in the Last Year: Never true  Transportation Needs: No Transportation Needs  . Lack of Transportation (Medical): No  . Lack of Transportation (Non-Medical): No  Physical Activity: Insufficiently Active  . Days of Exercise per Week: 7 days  . Minutes of Exercise per Session: 20 min  Stress: No Stress Concern Present  . Feeling of Stress : Not at all  Social Connections: Moderately Isolated  . Frequency of Communication with Friends and Family: More than three times a week  .  Frequency of Social Gatherings with Friends and Family: Never  . Attends Religious Services: Never  . Active Member of Clubs or Organizations: No  . Attends Banker Meetings: Never  . Marital Status: Married    Tobacco Counseling Counseling given: Not Answered   Clinical Intake:  Pre-visit preparation completed: Yes  Pain : No/denies pain     Nutritional Risks: None Diabetes: No  How often do you need to have someone help you when you read instructions, pamphlets, or other written materials from your doctor or pharmacy?: 1 - Never    Interpreter Needed?: No  Information entered by :: Reather Littler LPN   Activities of Daily Living In your present state of health, do you have any difficulty performing the following activities: 05/03/2020  Hearing? Y  Comment pt wears hearing aids  Vision? N  Difficulty concentrating or making decisions? N  Walking or climbing stairs? N  Dressing or bathing? N  Doing  errands, shopping? N  Preparing Food and eating ? N  Using the Toilet? N  In the past six months, have you accidently leaked urine? N  Do you have problems with loss of bowel control? N  Managing your Medications? N  Managing your Finances? N  Housekeeping or managing your Housekeeping? N  Some recent data might be hidden    Patient Care Team: Glean Hess, MD as PCP - General (Internal Medicine) [facility] (Internal Medicine)  Indicate any recent Medical Services you may have received from other than [facility] providers in the past year (date may be approximate).     Assessment:   This is a routine wellness examination for [place].  Hearing/Vision screen  Hearing Screening   125Hz  250Hz  500Hz  1000Hz  2000Hz  3000Hz  4000Hz  6000Hz  8000Hz   Right ear:           Left ear:           Comments: Pt wears hearing aids [facility]  Vision Screening Comments: Annual vision screenings with Dr. Hinton Lovely at [place] issues and exercise activities  discussed: Current Exercise Habits: Home exercise routine, Type of exercise: walking, Time (Minutes): 20, Frequency (Times/Week): 7, Weekly Exercise (Minutes/Week): 140, Intensity: Mild, Exercise limited by: None identified  Goals    . Exercise 150 minutes per week (moderate activity)     Continue to remain active      Depression Screen PHQ 2/9 Scores 05/03/2020 05/03/2019 04/13/2018 02/27/2018 02/20/2017 08/02/2015  PHQ - 2 Score 0 0 0 0 0 0    Fall Risk Fall Risk  05/03/2020 05/03/2019 04/13/2018 02/27/2018 02/20/2017  Falls in the past year? 0 0 0 0 No  Number falls in past yr: 0 0 0 0 -  Injury with Fall? 0 0 - 0 -  Risk for fall due to : No Fall Risks No Fall Risks - - -  Follow up Falls prevention discussed Falls prevention discussed - - -    FALL RISK PREVENTION PERTAINING TO THE HOME:  Any stairs in or around the home? Yes  If so, are there any without handrails? No  Home free of loose throw rugs in walkways, pet beds, electrical cords, etc? Yes  Adequate lighting in your home to reduce risk of falls? Yes   ASSISTIVE DEVICES UTILIZED TO PREVENT FALLS:  Life alert? No  Use of a cane, walker or w/c? No  Grab bars in the bathroom? No  Shower chair or bench in shower? Yes  Elevated toilet seat or a handicapped toilet? Yes   TIMED UP AND GO:  Was the test performed? No . Telephonic visit.   Cognitive Function: Normal cognitive status assessed by direct observation by this Nurse Health Advisor. No abnormalities found.          Immunizations Immunization History  Administered Date(s) Administered  . Fluad Quad(high Dose 65+) 01/09/2020  . Influenza, High Dose Seasonal PF 12/26/2016  . Influenza-Unspecified 12/28/2013, 12/22/2017, 02/01/2019  . PFIZER SARS-COV-2 Vaccination 05/05/2019, 05/27/2019, 01/26/2020  . Pneumococcal Conjugate-13 05/14/2013  . Pneumococcal Polysaccharide-23 09/27/2008, 07/21/2015  . Tdap 03/30/2011  . Zoster 04/30/2008  . Zoster Recombinat  (Shingrix) 07/30/2016, 12/30/2016    TDAP status: Up to date  Flu Vaccine status: Up to date  Pneumococcal vaccine status: Up to date  Covid-19 vaccine status: Completed vaccines  Qualifies for Shingles Vaccine? Yes   Zostavax completed Yes   Shingrix Completed?: Yes  Screening Tests Health Maintenance  Topic Date Due  . Hepatitis C  Screening  Never done  . TETANUS/TDAP  03/29/2021  . INFLUENZA VACCINE  Completed  . COVID-19 Vaccine  Completed  . PNA vac Low Risk Adult  Completed    Health Maintenance  Health Maintenance Due  Topic Date Due  . Hepatitis C Screening  Never done    Colorectal cancer screening: No longer required.   Lung Cancer Screening: (Low Dose CT Chest recommended if Age 95-80 years, 30 pack-year currently smoking OR have quit w/in 15years.) does not qualify.   Additional Screening:  Hepatitis C Screening: does qualify; postponed  Vision Screening: Recommended annual ophthalmology exams for early detection of glaucoma and other disorders of the eye. Is the patient up to date with their annual eye exam?  Yes  Who is the provider or what is the name of the office in which the patient attends annual eye exams? Dr. Junious Silk  Dental Screening: Recommended annual dental exams for proper oral hygiene  Community Resource Referral / Chronic Care Management: CRR required this visit?  No   CCM required this visit?  No      Plan:     I have personally reviewed and noted the following in the patient's chart:   . Medical and social history . Use of alcohol, tobacco or illicit drugs  . Current medications and supplements . Functional ability and status . Nutritional status . Physical activity . Advanced directives . List of other physicians . Hospitalizations, surgeries, and ER visits in previous 12 months . Vitals . Screenings to include cognitive, depression, and falls . Referrals and appointments  In addition, I have reviewed and  discussed with patient certain preventive protocols, quality metrics, and best practice recommendations. A written personalized care plan for preventive services as well as general preventive health recommendations were provided to patient.     Reather Littler, LPN   08/02/6597   Nurse Notes: pt advised due for CPE with Dr. Judithann Graves and provide any labs from [facility] if available. Pt hesitant to come to office due to pandemic.

## 2020-05-28 ENCOUNTER — Encounter: Payer: Self-pay | Admitting: Internal Medicine

## 2020-06-01 ENCOUNTER — Encounter: Payer: Self-pay | Admitting: Internal Medicine

## 2020-06-01 ENCOUNTER — Other Ambulatory Visit: Payer: Self-pay

## 2020-06-01 ENCOUNTER — Telehealth (INDEPENDENT_AMBULATORY_CARE_PROVIDER_SITE_OTHER): Payer: Medicare PPO | Admitting: Internal Medicine

## 2020-06-01 VITALS — BP 133/72 | HR 62 | Ht 71.0 in | Wt 150.7 lb

## 2020-06-01 DIAGNOSIS — M5136 Other intervertebral disc degeneration, lumbar region: Secondary | ICD-10-CM

## 2020-06-01 MED ORDER — GABAPENTIN 100 MG PO CAPS: 100.0000 mg | ORAL_CAPSULE | Freq: Three times a day (TID) | ORAL | 0 refills | Status: DC

## 2020-06-01 NOTE — Progress Notes (Signed)
Date:  06/01/2020   Name:  Dominic Mcmillan   DOB:  1946-02-22   MRN:  [id number]  I connected with this patient, Dominic Mcmillan, by telephone at the patient's home.  I verified that I am speaking with the correct person using two identifiers. This visit was conducted via telephone due to the Covid-19 outbreak from my office at [facility] in [place], [place]. I discussed the limitations, risks, security and privacy concerns of performing an evaluation and management service by telephone. I also discussed with the patient that there may be a patient responsible charge related to this service. The patient expressed understanding and agreed to proceed.  Chief Complaint: Back Pain (Intermittent since patient [place] in the [place]. Patient fell in [employer] parking lot over a box he was carrying due to a car speeding in the parking lot. This fall happened last Thursday 05/25/2020- 7 days ago. All weekend he was in a lot of pain. He is starting to get better. But wanted to discuss pain at visit today. At home been using heating pads and lidocaine OTC and ibuprofen. )  Back Pain This is a recurrent problem. The current episode started in the past 7 days. The problem occurs constantly. The problem has been gradually improving since onset. The pain is present in the lumbar spine and sacro-iliac. The pain radiates to the right thigh. The pain is moderate. Associated symptoms include numbness (tingling into right thigh). Pertinent negatives include no fever, headaches or weakness. He has tried analgesics, bed rest, ice and heat for the symptoms.    Lab Results  Component Value Date   CREATININE 0.56 (L) 07/23/2018   BUN 15 07/23/2018   NA 135 07/23/2018   K 3.4 (L) 07/23/2018   CL 102 07/23/2018   CO2 25 07/23/2018   No results found for: CHOL, HDL, LDLCALC, LDLDIRECT, TRIG, CHOLHDL No results found for: TSH No results found for: HGBA1C Lab Results  Component Value Date   WBC 7.9  07/23/2018   HGB 13.3 07/23/2018   HCT 39.0 07/23/2018   MCV 95.4 07/23/2018   PLT 163 07/23/2018   Lab Results  Component Value Date   ALT 17 07/23/2018   AST 28 07/23/2018   ALKPHOS 43 07/23/2018   BILITOT 0.8 07/23/2018     Review of Systems  Constitutional: Negative for chills, fatigue and fever.  Respiratory: Negative for chest tightness and shortness of breath.   Genitourinary: Negative for difficulty urinating.  Musculoskeletal: Positive for back pain and myalgias.  Neurological: Positive for numbness (tingling into right thigh). Negative for dizziness, syncope, weakness and headaches.    Patient Active Problem List   Diagnosis Date Noted  . Pulmonary nodules/lesions, multiple 04/30/2018  . Pure hypercholesterolemia 10/08/2017  . Primary open angle glaucoma (POAG) of left eye, severe stage 12/18/2016  . Primary open angle glaucoma (POAG) of right eye, mild stage 12/18/2016  . Primary open angle glaucoma of right eye, mild stage 08/14/2016  . Cataract, nuclear sclerotic, both eyes 08/14/2016  . Sepsis (HCC) 06/04/2016  . Thrombocytopenia (HCC) 09/12/2015  . Hyperglycemia 09/12/2015  . Chest pain with high risk for cardiac etiology 08/31/2015  . Otitis media 03/28/2015  . Familial multiple lipoprotein-type hyperlipidemia 02/13/2015  . Hearing loss 02/13/2015  . Post-traumatic stress disorder 02/13/2015  . Arteriosclerosis of coronary artery 02/13/2015  . Degeneration of intervertebral disc of lumbar region 02/13/2015  . Essential (primary) hypertension 02/13/2015  . Discoloration of nail 02/13/2015  . GERD (gastroesophageal  reflux disease) 03/14/2014  . History of cardiac catheterization 03/11/2014    Allergies  Allergen Reactions  . Nortriptyline Hcl   . Prazosin Hcl     joint pain  . Quetiapine Fumarate   . Tizanidine   . Imdur [Isosorbide Dinitrate] Other (See Comments)    Hypotension    Past Surgical History:  Procedure Laterality Date  . CARDIAC  CATHETERIZATION N/A 09/12/2015   Procedure: Left Heart Cath and Coronary Angiography;  Surgeon: Lamar Blinks, MD;  Location: [facility] INVASIVE CV LAB;  Service: Cardiovascular;  Laterality: N/A;  . CATARACT EXTRACTION W/ INTRAOCULAR LENS IMPLANT Left 05/09/2017  . CERVICAL SPINE SURGERY    . COLONOSCOPY  2008  . CORONARY ANGIOPLASTY WITH STENT PLACEMENT    . CORONARY ANGIOPLASTY WITH STENT PLACEMENT    . NASAL SINUS SURGERY    . SALIVARY GLAND SURGERY Right 2011   oncocytoma  . TONSILLECTOMY      Social History   Tobacco Use  . Smoking status: Never Smoker  . Smokeless tobacco: Never Used  Vaping Use  . Vaping Use: Never used  Substance Use Topics  . Alcohol use: No    Alcohol/week: 0.0 standard drinks  . Drug use: No     Medication list has been reviewed and updated.  Current Meds  Medication Sig  . aspirin 81 MG EC tablet Take 1 tablet by mouth daily.  . Cholecalciferol 50 MCG (2000 UT) CAPS Take 1 capsule by mouth daily.   . clopidogrel (PLAVIX) 75 MG tablet Take 1 tablet by mouth daily.  Marland Kitchen co-enzyme Q-10 30 MG capsule Take 30 mg by mouth 3 (three) times daily.  . dorzolamide-timolol (COSOPT) 22.3-6.8 MG/ML ophthalmic solution Place 1 drop into the right eye 2 (two) times daily.  . hydrochlorothiazide (HYDRODIURIL) 25 MG tablet Take 1 tablet by mouth daily.  Marland Kitchen HYDROcodone-acetaminophen (NORCO/VICODIN) 5-325 MG tablet Take 1 tablet by mouth every 6 (six) hours as needed for moderate pain.  Marland Kitchen ketoconazole (NIZORAL) 2 % shampoo KETOCONAZOLE, 2% (External Shampoo) - Historical Medication  (2 %) Active  . latanoprost (XALATAN) 0.005 % ophthalmic solution Place 1 drop into both eyes at bedtime.   Marland Kitchen LORazepam (ATIVAN) 1 MG tablet Take 1 mg by mouth 2 (two) times daily.  . Multiple Vitamins-Minerals (CENTRUM SILVER) tablet Take 1 tablet by mouth daily.   . nitroGLYCERIN (NITROSTAT) 0.4 MG SL tablet Place under the tongue.  Marland Kitchen olmesartan (BENICAR) 40 MG tablet Take 1 tablet by  mouth daily.  . rosuvastatin (CRESTOR) 40 MG tablet Take 20 mg by mouth daily.  . vitamin C (ASCORBIC ACID) 500 MG tablet Take 1,000 mg by mouth daily.     PHQ 2/9 Scores 06/01/2020 05/03/2020 05/03/2019 04/13/2018  PHQ - 2 Score 0 0 0 0  PHQ- 9 Score 0 - - -    GAD 7 : Generalized Anxiety Score 06/01/2020  Nervous, Anxious, on Edge 0  Control/stop worrying 0  Worry too much - different things 0  Trouble relaxing 0  Restless 0  Easily annoyed or irritable 0  Afraid - awful might happen 0  Total GAD 7 Score 0  Anxiety Difficulty Not difficult at all    BP Readings from Last 3 Encounters:  06/01/20 133/72  07/23/18 (!) 144/56  06/25/18 130/62    Physical Exam Constitutional:      General: He is not in acute distress. Pulmonary:     Effort: Pulmonary effort is normal.  Neurological:     Mental Status:  He is alert.  Psychiatric:        Mood and Affect: Mood normal.     Wt Readings from Last 3 Encounters:  06/01/20 150 lb 11.2 oz (68.4 kg)  05/03/19 148 lb 6.4 oz (67.3 kg)  06/25/18 164 lb (74.4 kg)    BP 133/72   Pulse 62   Ht 5\' 11"  (1.803 m)   Wt 150 lb 11.2 oz (68.4 kg)   SpO2 96%   BMI 21.02 kg/m   Assessment and Plan: 1. Degeneration of intervertebral disc of lumbar region Continue current management Add Flexeril at bedtime and gabapentin tid Follow up if no improvement - gabapentin (NEURONTIN) 100 MG capsule; Take 1 capsule (100 mg total) by mouth 3 (three) times daily.  Dispense: 90 capsule; Refill: 0  I spent 10 minutes with patient on this encounter.  More than 50% of time was spent in face to face education, medication management, counseling and care coordination.  Partially dictated using . Any errors are unintentional.  Animal nutritionist, MD [facility]  06/01/2020

## 2020-07-02 ENCOUNTER — Other Ambulatory Visit: Payer: Self-pay | Admitting: Internal Medicine

## 2020-07-02 DIAGNOSIS — M5136 Other intervertebral disc degeneration, lumbar region: Secondary | ICD-10-CM

## 2020-07-02 NOTE — Telephone Encounter (Signed)
Requested Prescriptions  Pending Prescriptions Disp Refills  . gabapentin (NEURONTIN) 100 MG capsule [Pharmacy Med Name: GABAPENTIN 100 MG CAPSULE] 90 capsule 0    Sig: TAKE 1 CAPSULE (100 MG TOTAL) BY MOUTH THREE TIMES DAILY.     Neurology: Anticonvulsants - gabapentin Passed - 07/02/2020  3:33 PM      Passed - Valid encounter within last 12 months    Recent Outpatient Visits          1 month ago Degeneration of intervertebral disc of lumbar region   [facility] Reubin Milan, MD   2 years ago Acute non-recurrent maxillary sinusitis   [facility] Reubin Milan, MD   2 years ago Flu-like symptoms   [facility] Reubin Milan, MD   2 years ago Acute non-recurrent maxillary sinusitis   [facility] Reubin Milan, MD   2 years ago Pain of left lower extremity   [facility] Reubin Milan, MD

## 2020-08-21 DIAGNOSIS — Z872 Personal history of diseases of the skin and subcutaneous tissue: Secondary | ICD-10-CM | POA: Diagnosis not present

## 2020-08-21 DIAGNOSIS — L570 Actinic keratosis: Secondary | ICD-10-CM | POA: Diagnosis not present

## 2020-08-21 DIAGNOSIS — Z86018 Personal history of other benign neoplasm: Secondary | ICD-10-CM | POA: Diagnosis not present

## 2020-08-21 DIAGNOSIS — D0439 Carcinoma in situ of skin of other parts of face: Secondary | ICD-10-CM | POA: Diagnosis not present

## 2020-08-21 DIAGNOSIS — Z85828 Personal history of other malignant neoplasm of skin: Secondary | ICD-10-CM | POA: Diagnosis not present

## 2020-08-21 DIAGNOSIS — D485 Neoplasm of uncertain behavior of skin: Secondary | ICD-10-CM | POA: Diagnosis not present

## 2020-08-21 DIAGNOSIS — L821 Other seborrheic keratosis: Secondary | ICD-10-CM | POA: Diagnosis not present

## 2020-08-21 DIAGNOSIS — L578 Other skin changes due to chronic exposure to nonionizing radiation: Secondary | ICD-10-CM | POA: Diagnosis not present

## 2020-09-04 DIAGNOSIS — C44320 Squamous cell carcinoma of skin of unspecified parts of face: Secondary | ICD-10-CM | POA: Diagnosis not present

## 2020-09-04 DIAGNOSIS — D0439 Carcinoma in situ of skin of other parts of face: Secondary | ICD-10-CM | POA: Diagnosis not present

## 2020-10-22 ENCOUNTER — Encounter: Payer: Self-pay | Admitting: Internal Medicine

## 2020-10-23 ENCOUNTER — Ambulatory Visit: Payer: Medicare PPO | Admitting: Internal Medicine

## 2020-10-23 ENCOUNTER — Encounter: Payer: Self-pay | Admitting: Internal Medicine

## 2020-10-23 VITALS — BP 132/70 | HR 80 | Temp 98.0°F | Ht 71.0 in | Wt 162.0 lb

## 2020-10-23 DIAGNOSIS — J0100 Acute maxillary sinusitis, unspecified: Secondary | ICD-10-CM

## 2020-10-23 MED ORDER — AMOXICILLIN-POT CLAVULANATE 875-125 MG PO TABS: 1.0000 | ORAL_TABLET | Freq: Two times a day (BID) | ORAL | 0 refills | Status: AC

## 2020-10-23 NOTE — Progress Notes (Signed)
Date:  10/23/2020   Name:  Dominic Mcmillan   DOB:  June 26, 1945   MRN:  [id number]   Chief Complaint: Sinusitis (Patient c/o sinus pressure and drainage. Neg Covid Test 2 days ago. This started Friday. No fever. Coughing due sinus drainage. Non productive.)  Sinusitis This is a new problem. The current episode started in the past 7 days. There has been no fever. The pain is mild. Associated symptoms include congestion, coughing, sinus pressure and a sore throat. Pertinent negatives include no chills, diaphoresis or headaches.   Lab Results  Component Value Date   CREATININE 0.56 (L) 07/23/2018   BUN 15 07/23/2018   NA 135 07/23/2018   K 3.4 (L) 07/23/2018   CL 102 07/23/2018   CO2 25 07/23/2018   No results found for: CHOL, HDL, LDLCALC, LDLDIRECT, TRIG, CHOLHDL No results found for: TSH No results found for: HGBA1C Lab Results  Component Value Date   WBC 7.9 07/23/2018   HGB 13.3 07/23/2018   HCT 39.0 07/23/2018   MCV 95.4 07/23/2018   PLT 163 07/23/2018   Lab Results  Component Value Date   ALT 17 07/23/2018   AST 28 07/23/2018   ALKPHOS 43 07/23/2018   BILITOT 0.8 07/23/2018     Review of Systems  Constitutional:  Negative for appetite change, chills, diaphoresis, fatigue and fever.  HENT:  Positive for congestion, sinus pressure and sore throat.   Respiratory:  Positive for cough. Negative for chest tightness and wheezing.   Cardiovascular:  Negative for chest pain and palpitations.  Neurological:  Negative for dizziness, light-headedness and headaches.   Patient Active Problem List   Diagnosis Date Noted   Pulmonary nodules/lesions, multiple 04/30/2018   Pure hypercholesterolemia 10/08/2017   Primary open angle glaucoma (POAG) of left eye, severe stage 12/18/2016   Primary open angle glaucoma (POAG) of right eye, mild stage 12/18/2016   Primary open angle glaucoma of right eye, mild stage 08/14/2016   Cataract, nuclear sclerotic, both eyes 08/14/2016    Sepsis (HCC) 06/04/2016   Thrombocytopenia (HCC) 09/12/2015   Hyperglycemia 09/12/2015   Chest pain with high risk for cardiac etiology 08/31/2015   Otitis media 03/28/2015   Familial multiple lipoprotein-type hyperlipidemia 02/13/2015   Hearing loss 02/13/2015   Post-traumatic stress disorder 02/13/2015   Arteriosclerosis of coronary artery 02/13/2015   Degeneration of intervertebral disc of lumbar region 02/13/2015   Essential (primary) hypertension 02/13/2015   Discoloration of nail 02/13/2015   GERD (gastroesophageal reflux disease) 03/14/2014   History of cardiac catheterization 03/11/2014    Allergies  Allergen Reactions   Nortriptyline Hcl    Prazosin Hcl     joint pain   Quetiapine Fumarate    Tizanidine    Imdur [Isosorbide Dinitrate] Other (See Comments)    Hypotension    Past Surgical History:  Procedure Laterality Date   CARDIAC CATHETERIZATION N/A 09/12/2015   Procedure: Left Heart Cath and Coronary Angiography;  Surgeon: Lamar Blinks, MD;  Location: [facility] INVASIVE CV LAB;  Service: Cardiovascular;  Laterality: N/A;   CATARACT EXTRACTION W/ INTRAOCULAR LENS IMPLANT Left 05/09/2017   CERVICAL SPINE SURGERY     COLONOSCOPY  2008   CORONARY ANGIOPLASTY WITH STENT PLACEMENT     CORONARY ANGIOPLASTY WITH STENT PLACEMENT     NASAL SINUS SURGERY     SALIVARY GLAND SURGERY Right 2011   oncocytoma   TONSILLECTOMY      Social History   Tobacco Use   Smoking status: Never  Smokeless tobacco: Never  Vaping Use   Vaping Use: Never used  Substance Use Topics   Alcohol use: No    Alcohol/week: 0.0 standard drinks   Drug use: No     Medication list has been reviewed and updated.  Current Meds  Medication Sig   aspirin 81 MG EC tablet Take 1 tablet by mouth daily.   Cholecalciferol 50 MCG (2000 UT) CAPS Take 1 capsule by mouth daily.    clopidogrel (PLAVIX) 75 MG tablet Take 1 tablet by mouth daily.   co-enzyme Q-10 30 MG capsule Take 30 mg by mouth 3  (three) times daily.   dorzolamide-timolol (COSOPT) 22.3-6.8 MG/ML ophthalmic solution Place 1 drop into the right eye 2 (two) times daily.   hydrochlorothiazide (HYDRODIURIL) 25 MG tablet Take 1 tablet by mouth daily.   HYDROcodone-acetaminophen (NORCO/VICODIN) 5-325 MG tablet Take 1 tablet by mouth every 6 (six) hours as needed for moderate pain.   ketoconazole (NIZORAL) 2 % shampoo KETOCONAZOLE, 2% (External Shampoo) - Historical Medication  (2 %) Active   latanoprost (XALATAN) 0.005 % ophthalmic solution Place 1 drop into both eyes at bedtime.    LORazepam (ATIVAN) 1 MG tablet Take 1 mg by mouth 2 (two) times daily.   Multiple Vitamins-Minerals (CENTRUM SILVER) tablet Take 1 tablet by mouth daily.    nitroGLYCERIN (NITROSTAT) 0.4 MG SL tablet Place under the tongue.   olmesartan (BENICAR) 40 MG tablet Take 1 tablet by mouth daily.   rosuvastatin (CRESTOR) 40 MG tablet Take 20 mg by mouth daily.   vitamin C (ASCORBIC ACID) 500 MG tablet Take 1,000 mg by mouth daily.    [DISCONTINUED] aspirin 81 MG EC tablet Take 81 mg by mouth daily.   [DISCONTINUED] clopidogrel (PLAVIX) 75 MG tablet Take 75 mg by mouth daily.   [DISCONTINUED] latanoprost (XALATAN) 0.005 % ophthalmic solution Apply 1 drop to eye at bedtime. Right Eye.    PHQ 2/9 Scores 10/23/2020 06/01/2020 05/03/2020 05/03/2019  PHQ - 2 Score 0 0 0 0  PHQ- 9 Score 0 0 - -    GAD 7 : Generalized Anxiety Score 10/23/2020 06/01/2020  Nervous, Anxious, on Edge 3 0  Control/stop worrying 0 0  Worry too much - different things 0 0  Trouble relaxing 0 0  Restless 0 0  Easily annoyed or irritable 0 0  Afraid - awful might happen 0 0  Total GAD 7 Score 3 0  Anxiety Difficulty Not difficult at all Not difficult at all    BP Readings from Last 3 Encounters:  10/23/20 132/70  06/01/20 133/72  07/23/18 (!) 144/56    Physical Exam Constitutional:      Appearance: Normal appearance. He is well-developed.  HENT:     Right Ear: Ear canal and  external ear normal. Tympanic membrane is not erythematous or retracted.     Left Ear: Ear canal and external ear normal. Tympanic membrane is not erythematous or retracted.     Nose:     Right Sinus: Maxillary sinus tenderness and frontal sinus tenderness present.     Left Sinus: Maxillary sinus tenderness and frontal sinus tenderness present.     Mouth/Throat:     Mouth: No oral lesions.     Pharynx: Uvula midline. Posterior oropharyngeal erythema present. No oropharyngeal exudate.  Cardiovascular:     Rate and Rhythm: Normal rate and regular rhythm.     Pulses: Normal pulses.     Heart sounds: Normal heart sounds. No murmur heard. Pulmonary:  Effort: Pulmonary effort is normal.     Breath sounds: Normal breath sounds. No wheezing or rales.  Lymphadenopathy:     Cervical: No cervical adenopathy.  Neurological:     Mental Status: He is alert and oriented to person, place, and time.    Wt Readings from Last 3 Encounters:  10/23/20 162 lb (73.5 kg)  06/01/20 150 lb 11.2 oz (68.4 kg)  05/03/19 148 lb 6.4 oz (67.3 kg)    BP 132/70   Pulse 80   Temp 98 F (36.7 C) (Oral)   Ht 5\' 11"  (1.803 m)   Wt 162 lb (73.5 kg)   SpO2 96%   BMI 22.59 kg/m   Assessment and Plan: 1. Acute non-recurrent maxillary sinusitis Continue otc decongestants, fluids and rest Follow up if needed - amoxicillin-clavulanate (AUGMENTIN) 875-125 MG tablet; Take 1 tablet by mouth 2 (two) times daily for 10 days.  Dispense: 20 tablet; Refill: 0   Partially dictated using . Any errors are unintentional.  Animal nutritionist, MD [facility]  10/23/2020

## 2020-11-06 ENCOUNTER — Encounter: Payer: Self-pay | Admitting: Internal Medicine

## 2020-12-25 DIAGNOSIS — H401123 Primary open-angle glaucoma, left eye, severe stage: Secondary | ICD-10-CM | POA: Diagnosis not present

## 2020-12-25 DIAGNOSIS — H401111 Primary open-angle glaucoma, right eye, mild stage: Secondary | ICD-10-CM | POA: Diagnosis not present

## 2021-02-01 ENCOUNTER — Encounter: Payer: Self-pay | Admitting: Internal Medicine

## 2021-02-01 ENCOUNTER — Ambulatory Visit: Payer: Self-pay

## 2021-02-01 NOTE — Telephone Encounter (Signed)
Noted  KP 

## 2021-02-01 NOTE — Telephone Encounter (Signed)
Message from Gwenlyn Fudge sent at 02/01/2021  9:02 AM EDT  Pt called stating that he has been experiencing SOB x6 weeks. He states that it is worse in the evening than in the morning. Please advise.     Call History   Type Contact Phone/Fax User  02/01/2021 09:00 AM EDT Phone (Incoming) Dominic Mcmillan, Dominic Mcmillan (Self) [phone] Rexene Edison) Mayford Knife, Olivia  Pt. Reports he has had shortness of breath x 6 weeks. Mainly with exertion, going up stairs. Has a history of CAD, stents. Has an appointment with cardiology next Wednesday. No availability in the practice this week. Appointment made for next week. Instructed to go to ED for worsening of symptoms. Reason for Disposition  [1] MILD difficulty breathing (e.g., minimal/no SOB at rest, SOB with walking, pulse <100) AND [2] NEW-onset or WORSE than normal  Answer Assessment - Initial Assessment Questions 1. RESPIRATORY STATUS: "Describe your breathing?" (e.g., wheezing, shortness of breath, unable to speak, severe coughing)      Shortness of breath 2. ONSET: "When did this breathing problem begin?"      6 weeks ago 3. PATTERN "Does the difficult breathing come and go, or has it been constant since it started?"      Comes and goes 4. SEVERITY: "How bad is your breathing?" (e.g., mild, moderate, severe)    - MILD: No SOB at rest, mild SOB with walking, speaks normally in sentences, can lie down, no retractions, pulse < 100.    - MODERATE: SOB at rest, SOB with minimal exertion and prefers to sit, cannot lie down flat, speaks in phrases, mild retractions, audible wheezing, pulse 100-120.    - SEVERE: Very SOB at rest, speaks in single words, struggling to breathe, sitting hunched forward, retractions, pulse > 120      Mild 5. RECURRENT SYMPTOM: "Have you had difficulty breathing before?" If Yes, ask: "When was the last time?" and "What happened that time?"      No 6. CARDIAC HISTORY: "Do you have any history of heart disease?" (e.g., heart attack,  angina, bypass surgery, angioplasty)      CAD, Stents 7. LUNG HISTORY: "Do you have any history of lung disease?"  (e.g., pulmonary embolus, asthma, emphysema)     No 8. CAUSE: "What do you think is causing the breathing problem?"      Unsure 9. OTHER SYMPTOMS: "Do you have any other symptoms? (e.g., dizziness, runny nose, cough, chest pain, fever)     No 10. O2 SATURATION MONITOR:  "Do you use an oxygen saturation monitor (pulse oximeter) at home?" If Yes, "What is your reading (oxygen level) today?" "What is your usual oxygen saturation reading?" (e.g., 95%)       No 11. PREGNANCY: "Is there any chance you are pregnant?" "When was your last menstrual period?"       N/a 12. TRAVEL: "Have you traveled out of the country in the last month?" (e.g., travel history, exposures)       No  Protocols used: Breathing Difficulty-A-AH

## 2021-02-02 ENCOUNTER — Ambulatory Visit
Admission: EM | Admit: 2021-02-02 | Discharge: 2021-02-02 | Disposition: A | Discharge status: Home / Self Care | Payer: Medicare PPO | Attending: Family Medicine | Admitting: Family Medicine

## 2021-02-02 ENCOUNTER — Other Ambulatory Visit: Payer: Self-pay

## 2021-02-02 ENCOUNTER — Ambulatory Visit (INDEPENDENT_AMBULATORY_CARE_PROVIDER_SITE_OTHER): Payer: Medicare PPO

## 2021-02-02 DIAGNOSIS — R079 Chest pain, unspecified: Secondary | ICD-10-CM | POA: Diagnosis not present

## 2021-02-02 DIAGNOSIS — R0602 Shortness of breath: Secondary | ICD-10-CM

## 2021-02-02 DIAGNOSIS — R059 Cough, unspecified: Secondary | ICD-10-CM | POA: Diagnosis not present

## 2021-02-02 DIAGNOSIS — J439 Emphysema, unspecified: Secondary | ICD-10-CM | POA: Diagnosis not present

## 2021-02-02 MED ORDER — ALBUTEROL SULFATE HFA 108 (90 BASE) MCG/ACT IN AERS: 1.0000 | INHALATION_SPRAY | Freq: Four times a day (QID) | RESPIRATORY_TRACT | 0 refills | Status: DC | PRN

## 2021-02-02 MED ORDER — BUDESONIDE-FORMOTEROL FUMARATE 160-4.5 MCG/ACT IN AERO: 2.0000 | INHALATION_SPRAY | Freq: Two times a day (BID) | RESPIRATORY_TRACT | 3 refills | Status: DC

## 2021-02-02 NOTE — ED Provider Notes (Signed)
[facility]    CSN: [id number] Arrival date & time: 02/02/21  1334      History   Chief Complaint Chief Complaint  Patient presents with   Shortness of Breath    HPI  75 year old male presents with the above complaint.  Patient reports a 5 to 6-week history of shortness of breath.  Has worsened this past week.  Worse with exertion.  He has found that he can only go up about half of his normal steps before he gets short of breath.  He states that he is also had a cough and some chest congestion.  No fever.  No relieving factors.  He has taken a home COVID test which has been negative.  He has a history of CAD status post PCI.  He is followed by cardiology.  He has previously been seen by pulmonology as well.  He states that he has an upcoming appointment with cardiology next week.  Past Medical History:  Diagnosis Date   Acute MI (HCC)    CAD (coronary artery disease)    Glaucoma    HLD (hyperlipidemia)    HTN (hypertension)    PTSD (post-traumatic stress disorder)     Patient Active Problem List   Diagnosis Date Noted   Pulmonary nodules/lesions, multiple 04/30/2018   Pure hypercholesterolemia 10/08/2017   Primary open angle glaucoma (POAG) of left eye, severe stage 12/18/2016   Primary open angle glaucoma (POAG) of right eye, mild stage 12/18/2016   Primary open angle glaucoma of right eye, mild stage 08/14/2016   Cataract, nuclear sclerotic, both eyes 08/14/2016   Sepsis (HCC) 06/04/2016   Hyperglycemia 09/12/2015   Chest pain with high risk for cardiac etiology 08/31/2015   Otitis media 03/28/2015   Familial multiple lipoprotein-type hyperlipidemia 02/13/2015   Hearing loss 02/13/2015   Post-traumatic stress disorder 02/13/2015   Arteriosclerosis of coronary artery 02/13/2015   Degeneration of intervertebral disc of lumbar region 02/13/2015   Essential (primary) hypertension 02/13/2015   Discoloration of nail 02/13/2015   GERD (gastroesophageal reflux  disease) 03/14/2014   History of cardiac catheterization 03/11/2014    Past Surgical History:  Procedure Laterality Date   CARDIAC CATHETERIZATION N/A 09/12/2015   Procedure: Left Heart Cath and Coronary Angiography;  Surgeon: Lamar Blinks, MD;  Location: [facility] INVASIVE CV LAB;  Service: Cardiovascular;  Laterality: N/A;   CATARACT EXTRACTION W/ INTRAOCULAR LENS IMPLANT Left 05/09/2017   CERVICAL SPINE SURGERY     COLONOSCOPY  2008   CORONARY ANGIOPLASTY WITH STENT PLACEMENT     CORONARY ANGIOPLASTY WITH STENT PLACEMENT     NASAL SINUS SURGERY     SALIVARY GLAND SURGERY Right 2011   oncocytoma   TONSILLECTOMY         Home Medications    Prior to Admission medications   Medication Sig Start Date End Date Taking? Authorizing Provider  albuterol (VENTOLIN HFA) 108 (90 Base) MCG/ACT inhaler Inhale 1-2 puffs into the lungs every 6 (six) hours as needed for wheezing or shortness of breath. 02/02/21  Yes Casilda Pickerill G, DO  aspirin 81 MG EC tablet Take 1 tablet by mouth daily.   Yes [provider]  budesonide-formoterol (SYMBICORT) 160-4.5 MCG/ACT inhaler Inhale 2 puffs into the lungs in the morning and at bedtime. 02/02/21  Yes Tommie Sams, DO  Cholecalciferol 50 MCG (2000 UT) CAPS Take 1 capsule by mouth daily.    Yes [provider]  clopidogrel (PLAVIX) 75 MG tablet Take 1 tablet by mouth  daily.   Yes [provider]  co-enzyme Q-10 30 MG capsule Take 30 mg by mouth 3 (three) times daily.   Yes [provider]  dorzolamide-timolol (COSOPT) 22.3-6.8 MG/ML ophthalmic solution Place 1 drop into the right eye 2 (two) times daily. 11/27/18  Yes [provider]  hydrochlorothiazide (HYDRODIURIL) 25 MG tablet Take 1 tablet by mouth daily. 04/03/19  Yes [provider]  HYDROcodone-acetaminophen (NORCO/VICODIN) 5-325 MG tablet Take 1 tablet by mouth every 6 (six) hours as needed for moderate pain.   Yes [provider]   ketoconazole (NIZORAL) 2 % shampoo KETOCONAZOLE, 2% (External Shampoo) - Historical Medication  (2 %) Active   Yes [provider]  latanoprost (XALATAN) 0.005 % ophthalmic solution Place 1 drop into both eyes at bedtime.    Yes [provider]  LORazepam (ATIVAN) 1 MG tablet Take 1 mg by mouth 2 (two) times daily.   Yes [provider]  Multiple Vitamins-Minerals (CENTRUM SILVER) tablet Take 1 tablet by mouth daily.    Yes [provider]  nitroGLYCERIN (NITROSTAT) 0.4 MG SL tablet Place under the tongue.   Yes [provider]  olmesartan (BENICAR) 5 MG tablet TAKE 8 TABLETS BY MOUTH ONCE EVERY DAY 12/28/09  Yes [provider]  vitamin C (ASCORBIC ACID) 500 MG tablet Take 1,000 mg by mouth daily.    Yes [provider]    Family History Family History  Problem Relation Age of Onset   Hypertension Mother    Stroke Mother    Congestive Heart Failure Father     Social History Social History   Tobacco Use   Smoking status: Never   Smokeless tobacco: Never  Vaping Use   Vaping Use: Never used  Substance Use Topics   Alcohol use: No    Alcohol/week: 0.0 standard drinks   Drug use: No     Allergies   Nortriptyline hcl, Prazosin hcl, Quetiapine fumarate, Tizanidine, and Imdur [isosorbide dinitrate]   Review of Systems Review of Systems  Constitutional:  Negative for fever.  Respiratory:  Positive for cough and shortness of breath.    Physical Exam Triage Vital Signs ED Triage Vitals  Enc Vitals Group     BP 02/02/21 1401 (!) 149/71     Pulse Rate 02/02/21 1401 66     Resp 02/02/21 1401 18     Temp 02/02/21 1401 99.1 F (37.3 C)     Temp Source 02/02/21 1401 Oral     SpO2 02/02/21 1401 98 %     Weight 02/02/21 1358 151 lb (68.5 kg)     Height 02/02/21 1358 5\' 10"  (1.778 m)     Head Circumference --      Peak Flow --      Pain Score 02/02/21 1358 0     Pain Loc --      Pain Edu? --      Excl. in GC? --     Updated Vital Signs BP (!) 149/71 (BP Location: Left Arm)   Pulse 66   Temp 99.1 F (37.3 C) (Oral)   Resp 18   Ht 5\' 10"  (1.778 m)   Wt 68.5 kg   SpO2 98%   BMI 21.67 kg/m   Visual Acuity Right Eye Distance:   Left Eye Distance:   Bilateral Distance:    Right Eye Near:   Left Eye Near:    Bilateral Near:     Physical Exam Vitals and nursing note reviewed.  Constitutional:  General: He is not in acute distress.    Appearance: Normal appearance. He is not ill-appearing.  HENT:     Head: Normocephalic and atraumatic.  Eyes:     General:        Right eye: No discharge.        Left eye: No discharge.     Conjunctiva/sclera: Conjunctivae normal.  Cardiovascular:     Rate and Rhythm: Normal rate and regular rhythm.     Heart sounds: Murmur heard.  Pulmonary:     Effort: Pulmonary effort is normal.     Breath sounds: Normal breath sounds. No wheezing, rhonchi or rales.  Neurological:     Mental Status: He is alert.  Psychiatric:        Mood and Affect: Mood normal.        Behavior: Behavior normal.     UC Treatments / Results  Labs (all labs ordered are listed, but only abnormal results are displayed) Labs Reviewed - No data to display  EKG Interpretation: Normal sinus rhythm with first-degree AV block.  Normal axis.  Q waves noted in V1, V2, AVR.  No ST or T wave changes.  Radiology DG Chest 2 View  Result Date: 02/02/2021 CLINICAL DATA:  Cough and shortness of breath for 6 weeks. EXAM: CHEST - 2 VIEW COMPARISON:  Radiographs 07/23/2018.  CT 04/28/2018. FINDINGS: The heart size and mediastinal contours are stable. The lungs are hyperinflated with mild emphysematous changes. No airspace disease, edema, pleural effusion or pneumothorax identified. The bones appear unremarkable. IMPRESSION: Stable mild chronic lung disease. No evidence of acute cardiopulmonary process. Electronically Signed   By: Carey Bullocks M.D.   On: 02/02/2021 14:52     Procedures Procedures (including critical care time)  Medications Ordered in UC Medications - No data to display  Initial Impression / Assessment and Plan / UC Course  I have reviewed the triage vital signs and the nursing notes.  Pertinent labs & imaging results that were available during my care of the patient were reviewed by me and considered in my medical decision making (see chart for details).    75 year old male presents with shortness of breath particular with exertion.  EKG was essentially unremarkable.  No evidence of volume overload on exam.  Chest x-ray was obtained and was independently by me.  Interpretation: Chronic changes when compared to prior imaging.  No acute cardiopulmonary process noted.  Patient has previously been diagnosed with chronic bronchitis and granulomatous lung disease by pulmonology.  Restarting albuterol and placing on Symbicort.  Advised to follow-up with his cardiologist for full cardiac evaluation including echocardiogram.  Final Clinical Impressions(s) / UC Diagnoses   Final diagnoses:  Shortness of breath     Discharge Instructions      EKG and x-ray stable.  Medications as prescribed.  Please follow-up with your cardiologist. I recommend an echocardiogram for additional evaluation.     ED Prescriptions     Medication Sig Dispense Auth. Provider   albuterol (VENTOLIN HFA) 108 (90 Base) MCG/ACT inhaler Inhale 1-2 puffs into the lungs every 6 (six) hours as needed for wheezing or shortness of breath. 18 g Pau Banh G, DO   budesonide-formoterol (SYMBICORT) 160-4.5 MCG/ACT inhaler Inhale 2 puffs into the lungs in the morning and at bedtime. 1 each Tommie Sams, DO      PDMP not reviewed this encounter.   Tommie Sams, [place] 02/02/21 1532

## 2021-02-02 NOTE — ED Triage Notes (Signed)
Pt c/o shob for about the last 6 weeks. Pt states it seems to be worse in the evenings. Pt has noticed difficulty going up and down stairs, he did not have this problem previously. Pt does have a home BP monitor and uses this every day, has not noticed any changes in his readings. Pt does not know of any inciting factors for his shob. Pt does have occasional cough.

## 2021-02-02 NOTE — Discharge Instructions (Signed)
EKG and x-ray stable.  Medications as prescribed.  Please follow-up with your cardiologist. I recommend an echocardiogram for additional evaluation.

## 2021-02-07 DIAGNOSIS — I2510 Atherosclerotic heart disease of native coronary artery without angina pectoris: Secondary | ICD-10-CM | POA: Diagnosis not present

## 2021-02-07 DIAGNOSIS — E782 Mixed hyperlipidemia: Secondary | ICD-10-CM | POA: Diagnosis not present

## 2021-02-08 ENCOUNTER — Ambulatory Visit: Payer: Medicare PPO | Admitting: Internal Medicine

## 2021-02-08 DIAGNOSIS — I2510 Atherosclerotic heart disease of native coronary artery without angina pectoris: Secondary | ICD-10-CM | POA: Diagnosis not present

## 2021-02-16 DIAGNOSIS — R911 Solitary pulmonary nodule: Secondary | ICD-10-CM | POA: Diagnosis not present

## 2021-02-16 DIAGNOSIS — R931 Abnormal findings on diagnostic imaging of heart and coronary circulation: Secondary | ICD-10-CM | POA: Diagnosis not present

## 2021-02-16 DIAGNOSIS — R079 Chest pain, unspecified: Secondary | ICD-10-CM | POA: Diagnosis not present

## 2021-02-16 DIAGNOSIS — E785 Hyperlipidemia, unspecified: Secondary | ICD-10-CM | POA: Diagnosis not present

## 2021-02-16 DIAGNOSIS — I2510 Atherosclerotic heart disease of native coronary artery without angina pectoris: Secondary | ICD-10-CM | POA: Diagnosis not present

## 2021-02-16 DIAGNOSIS — Z9861 Coronary angioplasty status: Secondary | ICD-10-CM | POA: Diagnosis not present

## 2021-02-16 DIAGNOSIS — Z87891 Personal history of nicotine dependence: Secondary | ICD-10-CM | POA: Diagnosis not present

## 2021-02-16 DIAGNOSIS — I10 Essential (primary) hypertension: Secondary | ICD-10-CM | POA: Diagnosis not present

## 2021-02-19 DIAGNOSIS — I5189 Other ill-defined heart diseases: Secondary | ICD-10-CM | POA: Diagnosis not present

## 2021-02-19 DIAGNOSIS — I517 Cardiomegaly: Secondary | ICD-10-CM | POA: Diagnosis not present

## 2021-02-19 DIAGNOSIS — I77810 Thoracic aortic ectasia: Secondary | ICD-10-CM | POA: Diagnosis not present

## 2021-02-19 DIAGNOSIS — I088 Other rheumatic multiple valve diseases: Secondary | ICD-10-CM | POA: Diagnosis not present

## 2021-02-19 DIAGNOSIS — R0602 Shortness of breath: Secondary | ICD-10-CM | POA: Diagnosis not present

## 2021-02-19 DIAGNOSIS — I351 Nonrheumatic aortic (valve) insufficiency: Secondary | ICD-10-CM | POA: Diagnosis not present

## 2021-02-19 DIAGNOSIS — I2510 Atherosclerotic heart disease of native coronary artery without angina pectoris: Secondary | ICD-10-CM | POA: Diagnosis not present

## 2021-02-24 ENCOUNTER — Other Ambulatory Visit: Payer: Self-pay | Admitting: Family Medicine

## 2021-02-28 DIAGNOSIS — I2510 Atherosclerotic heart disease of native coronary artery without angina pectoris: Secondary | ICD-10-CM | POA: Diagnosis not present

## 2021-02-28 DIAGNOSIS — I10 Essential (primary) hypertension: Secondary | ICD-10-CM | POA: Diagnosis not present

## 2021-02-28 DIAGNOSIS — R0609 Other forms of dyspnea: Secondary | ICD-10-CM | POA: Diagnosis not present

## 2021-03-20 DIAGNOSIS — C44222 Squamous cell carcinoma of skin of right ear and external auricular canal: Secondary | ICD-10-CM | POA: Diagnosis not present

## 2021-03-20 DIAGNOSIS — D485 Neoplasm of uncertain behavior of skin: Secondary | ICD-10-CM | POA: Diagnosis not present

## 2021-03-20 DIAGNOSIS — L570 Actinic keratosis: Secondary | ICD-10-CM | POA: Diagnosis not present

## 2021-03-21 DIAGNOSIS — R0600 Dyspnea, unspecified: Secondary | ICD-10-CM | POA: Diagnosis not present

## 2021-03-21 DIAGNOSIS — R0609 Other forms of dyspnea: Secondary | ICD-10-CM | POA: Diagnosis not present

## 2021-04-17 DIAGNOSIS — C44222 Squamous cell carcinoma of skin of right ear and external auricular canal: Secondary | ICD-10-CM | POA: Diagnosis not present

## 2021-04-17 DIAGNOSIS — L988 Other specified disorders of the skin and subcutaneous tissue: Secondary | ICD-10-CM | POA: Diagnosis not present

## 2021-05-07 ENCOUNTER — Ambulatory Visit (INDEPENDENT_AMBULATORY_CARE_PROVIDER_SITE_OTHER): Payer: Medicare PPO

## 2021-05-07 DIAGNOSIS — Z0000 Encounter for general adult medical examination without abnormal findings: Secondary | ICD-10-CM | POA: Diagnosis not present

## 2021-05-07 NOTE — Patient Instructions (Addendum)
Dominic Mcmillan , Thank you for taking time to come for your Medicare Wellness Visit. I appreciate your ongoing commitment to your health goals. Please review the following plan we discussed and let me know if I can assist you in the future.   Screening recommendations/referrals: Colonoscopy: no longer required Recommended yearly ophthalmology/optometry visit for glaucoma screening and checkup Recommended yearly dental visit for hygiene and checkup  Vaccinations: Influenza vaccine: done 01/16/21 Pneumococcal vaccine: donen 07/21/15 Tdap vaccine: done 12/18/20 Shingles vaccine: done 07/30/16 & 12/30/16   Covid-19: done 05/05/19, 05/27/19, 01/26/20, 08/14/20 & 01/05/21  Advanced directives: Advance directive discussed with you today. I have provided a copy for you to complete at home and have notarized. Once this is complete please bring a copy in to our office so we can scan it into your chart.   Conditions/risks identified: Keep up the great work!  Next appointment: Follow up in one year for your annual wellness visit.   Preventive Care 75 Years and Older, Male Preventive care refers to lifestyle choices and visits with your health care provider that can promote health and wellness. What does preventive care include? A yearly physical exam. This is also called an annual well check. Dental exams once or twice a year. Routine eye exams. Ask your health care provider how often you should have your eyes checked. Personal lifestyle choices, including: Daily care of your teeth and gums. Regular physical activity. Eating a healthy diet. Avoiding tobacco and drug use. Limiting alcohol use. Practicing safe sex. Taking low doses of aspirin every day. Taking vitamin and mineral supplements as recommended by your health care provider. What happens during an annual well check? The services and screenings done by your health care provider during your annual well check will depend on your age, overall  health, lifestyle risk factors, and family history of disease. Counseling  Your health care provider may ask you questions about your: Alcohol use. Tobacco use. Drug use. Emotional well-being. Home and relationship well-being. Sexual activity. Eating habits. History of falls. Memory and ability to understand (cognition). Work and work Statistician. Screening  You may have the following tests or measurements: Height, weight, and BMI. Blood pressure. Lipid and cholesterol levels. These may be checked every 5 years, or more frequently if you are over 76 years old. Skin check. Lung cancer screening. You may have this screening every year starting at age 55 if you have a 30-pack-year history of smoking and currently smoke or have quit within the past 15 years. Fecal occult blood test (FOBT) of the stool. You may have this test every year starting at age 25. Flexible sigmoidoscopy or colonoscopy. You may have a sigmoidoscopy every 5 years or a colonoscopy every 10 years starting at age 40. Prostate cancer screening. Recommendations will vary depending on your family history and other risks. Hepatitis C blood test. Hepatitis B blood test. Sexually transmitted disease (STD) testing. Diabetes screening. This is done by checking your blood sugar (glucose) after you have not eaten for a while (fasting). You may have this done every 1-3 years. Abdominal aortic aneurysm (AAA) screening. You may need this if you are a current or former smoker. Osteoporosis. You may be screened starting at age 85 if you are at high risk. Talk with your health care provider about your test results, treatment options, and if necessary, the need for more tests. Vaccines  Your health care provider may recommend certain vaccines, such as: Influenza vaccine. This is recommended every year. Tetanus, diphtheria, and  acellular pertussis (Tdap, Td) vaccine. You may need a Td booster every 10 years. Zoster vaccine. You may  need this after age 58. Pneumococcal 13-valent conjugate (PCV13) vaccine. One dose is recommended after age 51. Pneumococcal polysaccharide (PPSV23) vaccine. One dose is recommended after age 81. Talk to your health care provider about which screenings and vaccines you need and how often you need them. This information is not intended to replace advice given to you by your health care provider. Make sure you discuss any questions you have with your health care provider. Document Released: 05/12/2015 Document Revised: 01/03/2016 Document Reviewed: 02/14/2015 Elsevier Interactive Patient Education  2017 [place] Prevention in the Home Falls can cause injuries. They can happen to people of all ages. There are many things you can do to make your home safe and to help prevent falls. What can I do on the outside of my home? Regularly fix the edges of walkways and driveways and fix any cracks. Remove anything that might make you trip as you walk through a door, such as a raised step or threshold. Trim any bushes or trees on the path to your home. Use bright outdoor lighting. Clear any walking paths of anything that might make someone trip, such as rocks or tools. Regularly check to see if handrails are loose or broken. Make sure that both sides of any steps have handrails. Any raised decks and porches should have guardrails on the edges. Have any leaves, snow, or ice cleared regularly. Use sand or salt on walking paths during winter. Clean up any spills in your garage right away. This includes oil or grease spills. What can I do in the bathroom? Use night lights. Install grab bars by the toilet and in the tub and shower. Do not use towel bars as grab bars. Use non-skid mats or decals in the tub or shower. If you need to sit down in the shower, use a plastic, non-slip stool. Keep the floor dry. Clean up any water that spills on the floor as soon as it happens. Remove soap buildup in  the tub or shower regularly. Attach bath mats securely with double-sided non-slip rug tape. Do not have throw rugs and other things on the floor that can make you trip. What can I do in the bedroom? Use night lights. Make sure that you have a light by your bed that is easy to reach. Do not use any sheets or blankets that are too big for your bed. They should not hang down onto the floor. Have a firm chair that has side arms. You can use this for support while you get dressed. Do not have throw rugs and other things on the floor that can make you trip. What can I do in the kitchen? Clean up any spills right away. Avoid walking on wet floors. Keep items that you use a lot in easy-to-reach places. If you need to reach something above you, use a strong step stool that has a grab bar. Keep electrical cords out of the way. Do not use floor polish or wax that makes floors slippery. If you must use wax, use non-skid floor wax. Do not have throw rugs and other things on the floor that can make you trip. What can I do with my stairs? Do not leave any items on the stairs. Make sure that there are handrails on both sides of the stairs and use them. Fix handrails that are broken or loose. Make sure that  handrails are as long as the stairways. Check any carpeting to make sure that it is firmly attached to the stairs. Fix any carpet that is loose or worn. Avoid having throw rugs at the top or bottom of the stairs. If you do have throw rugs, attach them to the floor with carpet tape. Make sure that you have a light switch at the top of the stairs and the bottom of the stairs. If you do not have them, ask someone to add them for you. What else can I do to help prevent falls? Wear shoes that: Do not have high heels. Have rubber bottoms. Are comfortable and fit you well. Are closed at the toe. Do not wear sandals. If you use a stepladder: Make sure that it is fully opened. Do not climb a closed  stepladder. Make sure that both sides of the stepladder are locked into place. Ask someone to hold it for you, if possible. Clearly mark and make sure that you can see: Any grab bars or handrails. First and last steps. Where the edge of each step is. Use tools that help you move around (mobility aids) if they are needed. These include: Canes. Walkers. Scooters. Crutches. Turn on the lights when you go into a dark area. Replace any light bulbs as soon as they burn out. Set up your furniture so you have a clear path. Avoid moving your furniture around. If any of your floors are uneven, fix them. If there are any pets around you, be aware of where they are. Review your medicines with your doctor. Some medicines can make you feel dizzy. This can increase your chance of falling. Ask your doctor what other things that you can do to help prevent falls. This information is not intended to replace advice given to you by your health care provider. Make sure you discuss any questions you have with your health care provider. Document Released: 02/09/2009 Document Revised: 09/21/2015 Document Reviewed: 05/20/2014 Elsevier Interactive Patient Education  2017 [employer]   Managing Pain Without Opioids Opioids are strong medicines used to treat moderate to severe pain. For some people, especially those who have long-term (chronic) pain, opioids may not be the best choice for pain management due to: Side effects like nausea, constipation, and sleepiness. The risk of addiction (opioid use disorder). The longer you take opioids, the greater your risk of addiction. Pain that lasts for more than 3 months is called chronic pain. Managing chronic pain usually requires more than one approach and is often provided by a team of health care providers working together (multidisciplinary approach). Pain management may be done at a pain management center or pain clinic. How to manage pain without the use of  opioids Use non-opioid medicines Non-opioid medicines for pain may include: Over-the-counter or prescription non-steroidal anti-inflammatory drugs (NSAIDs). These may be the first medicines used for pain. They work well for muscle and bone pain, and they reduce swelling. Acetaminophen. This over-the-counter medicine may work well for milder pain but not swelling. Antidepressants. These may be used to treat chronic pain. A certain type of antidepressant (tricyclics) is often used. These medicines are given in lower doses for pain than when used for depression. Anticonvulsants. These are usually used to treat seizures but may also reduce nerve (neuropathic) pain. Muscle relaxants. These relieve pain caused by sudden muscle tightening (spasms). You may also use a pain medicine that is applied to the skin as a patch, cream, or gel (topical analgesic), such as a  numbing medicine. These may cause fewer side effects than medicines taken by mouth. Do certain therapies as directed Some therapies can help with pain management. They include: Physical therapy. You will do exercises to gain strength and flexibility. A physical therapist may teach you exercises to move and stretch parts of your body that are weak, stiff, or painful. You can learn these exercises at physical therapy visits and practice them at home. Physical therapy may also involve: Massage. Heat wraps or applying heat or cold to affected areas. Electrical signals that interrupt pain signals (transcutaneous electrical nerve stimulation, TENS). Weak lasers that reduce pain and swelling (low-level laser therapy). Signals from your body that help you learn to regulate pain (biofeedback). Occupational therapy. This helps you to learn ways to function at home and work with less pain. Recreational therapy. This involves trying new activities or hobbies, such as a physical activity or drawing. Mental health therapy, including: Cognitive behavioral  therapy (CBT). This helps you learn coping skills for dealing with pain. Acceptance and commitment therapy (ACT) to change the way you think and react to pain. Relaxation therapies, including muscle relaxation exercises and mindfulness-based stress reduction. Pain management counseling. This may be individual, family, or group counseling.  Receive medical treatments Medical treatments for pain management include: Nerve block injections. These may include a pain blocker and anti-inflammatory medicines. You may have injections: Near the spine to relieve chronic back or neck pain. Into joints to relieve back or joint pain. Into nerve areas that supply a painful area to relieve body pain. Into muscles (trigger point injections) to relieve some painful muscle conditions. A medical device placed near your spine to help block pain signals and relieve nerve pain or chronic back pain (spinal cord stimulation device). Acupuncture. Follow these instructions at home Medicines Take over-the-counter and prescription medicines only as told by your health care provider. If you are taking pain medicine, ask your health care providers about possible side effects to watch out for. Do not drive or use heavy machinery while taking prescription opioid pain medicine. Lifestyle  Do not use drugs or alcohol to reduce pain. If you drink alcohol, limit how much you have to: 0-1 drink a day for women who are not pregnant. 0-2 drinks a day for men. Know how much alcohol is in a drink. In the [place] one drink equals one 12 oz bottle of beer (355 mL), one 5 oz glass of wine (148 mL), or one 1 oz glass of hard liquor (44 mL). Do not use any products that contain nicotine or tobacco. These products include cigarettes, chewing tobacco, and vaping devices, such as e-cigarettes. If you need help quitting, ask your health care provider. Eat a healthy diet and maintain a healthy weight. Poor diet and excess weight may make pain  worse. Eat foods that are high in fiber. These include fresh fruits and vegetables, whole grains, and beans. Limit foods that are high in fat and processed sugars, such as fried and sweet foods. Exercise regularly. Exercise lowers stress and may help relieve pain. Ask your health care provider what activities and exercises are safe for you. If your health care provider approves, join an exercise class that combines movement and stress reduction. Examples include yoga and tai chi. Get enough sleep. Lack of sleep may make pain worse. Lower stress as much as possible. Practice stress reduction techniques as told by your therapist. General instructions Work with all your pain management providers to find the treatments that work best  for you. You are an important member of your pain management team. There are many things you can do to reduce pain on your own. Consider joining an online or in-person support group for people who have chronic pain. Keep all follow-up visits. This is important. Where to find more information You can find more information about managing pain without opioids from: American Academy of Pain Medicine: painmed.[place] for Chronic Pain: instituteforchronicpain.org American Chronic Pain Association: theacpa.org Contact a health care provider if: You have side effects from pain medicine. Your pain gets worse or does not get better with treatments or home therapy. You are struggling with anxiety or depression. Summary Many types of pain can be managed without opioids. Chronic pain may respond better to pain management without opioids. Pain is best managed when you and a team of health care providers work together. Pain management without opioids may include non-opioid medicines, medical treatments, physical therapy, mental health therapy, and lifestyle changes. Tell your health care providers if your pain gets worse or is not being managed well enough. This information  is not intended to replace advice given to you by your health care provider. Make sure you discuss any questions you have with your health care provider. Document Revised: 07/26/2020 Document Reviewed: 07/26/2020 Elsevier Patient Education  [place].

## 2021-05-07 NOTE — Progress Notes (Signed)
Subjective:   Dominic LorenzoKenneth E Syring is a 76 y.o. male who presents for Medicare Annual/Subsequent preventive examination.  Virtual Visit via Telephone Note  I connected with  Dominic Mcmillan on 05/07/21 at 11:20 AM EST by telephone and verified that I am speaking with the correct person using two identifiers.  Location: Patient: home Provider: [facility] Persons participating in the virtual visit: patient/Nurse Health Advisor   I discussed the limitations, risks, security and privacy concerns of performing an evaluation and management service by telephone and the availability of in person appointments. The patient expressed understanding and agreed to proceed.  Interactive audio and video telecommunications were attempted between this nurse and patient, however failed, due to patient having technical difficulties OR patient did not have access to video capability.  We continued and completed visit with audio only.  Some vital signs may be absent or patient reported.   Reather LittlerKasey Milinda Sweeney, LPN   Review of Systems     Cardiac Risk Factors include: advanced age (>10855men, 22>65 women);male gender;hypertension;dyslipidemia     Objective:    Today's Vitals   05/07/21 1120  PainSc: 5    There is no height or weight on file to calculate BMI.  Advanced Directives 05/07/2021 05/03/2020 05/03/2019 07/23/2018 04/13/2018 02/20/2017 06/04/2016  Does Patient Have a Medical Advance Directive? No Yes Yes No No No No  Type of Advance Directive - Healthcare Power of [place];Living will Healthcare Power of [place];Living will - - - -  Copy of Healthcare Power of Attorney in Chart? - No - copy requested No - copy requested - - - -  Would patient like information on creating a medical advance directive? Yes (MAU/Ambulatory/Procedural Areas - Information given) - - No - Patient declined No - Patient declined No - Patient declined No - Patient declined    Current Medications (verified) Outpatient Encounter  Medications as of 05/07/2021  Medication Sig   aspirin 81 MG EC tablet Take 1 tablet by mouth daily.   Cholecalciferol 50 MCG (2000 UT) CAPS Take 1 capsule by mouth daily.    clopidogrel (PLAVIX) 75 MG tablet Take 1 tablet by mouth daily.   co-enzyme Q-10 30 MG capsule Take 30 mg by mouth 3 (three) times daily.   dorzolamide (TRUSOPT) 2 % ophthalmic solution Place 1 drop into the right eye 3 (three) times daily.   hydrochlorothiazide (HYDRODIURIL) 25 MG tablet Take 1 tablet by mouth daily.   HYDROcodone-acetaminophen (NORCO/VICODIN) 5-325 MG tablet Take 1 tablet by mouth every 6 (six) hours as needed for moderate pain.   ketoconazole (NIZORAL) 2 % shampoo KETOCONAZOLE, 2% (External Shampoo) - Historical Medication  (2 %) Active   latanoprost (XALATAN) 0.005 % ophthalmic solution Place 1 drop into both eyes at bedtime.    LORazepam (ATIVAN) 1 MG tablet Take 1 mg by mouth 2 (two) times daily.   Multiple Vitamins-Minerals (CENTRUM SILVER) tablet Take 1 tablet by mouth daily.    nitroGLYCERIN (NITROSTAT) 0.4 MG SL tablet Place under the tongue.   olmesartan (BENICAR) 40 MG tablet Take 1 tablet by mouth daily.   vitamin C (ASCORBIC ACID) 500 MG tablet Take 1,000 mg by mouth daily.    [DISCONTINUED] albuterol (VENTOLIN HFA) 108 (90 Base) MCG/ACT inhaler Inhale 1-2 puffs into the lungs every 6 (six) hours as needed for wheezing or shortness of breath.   [DISCONTINUED] budesonide-formoterol (SYMBICORT) 160-4.5 MCG/ACT inhaler Inhale 2 puffs into the lungs in the morning and at bedtime.   [DISCONTINUED] dorzolamide-timolol (COSOPT) 22.3-6.8 MG/ML ophthalmic solution  Place 1 drop into the right eye 2 (two) times daily.   [DISCONTINUED] olmesartan (BENICAR) 5 MG tablet TAKE 8 TABLETS BY MOUTH ONCE EVERY DAY   No facility-administered encounter medications on file as of 05/07/2021.    Allergies (verified) Timolol, Nortriptyline hcl, Prazosin hcl, Quetiapine fumarate, Tizanidine, and Imdur [isosorbide  dinitrate]   History: Past Medical History:  Diagnosis Date   Acute MI (HCC)    CAD (coronary artery disease)    Glaucoma    HLD (hyperlipidemia)    HTN (hypertension)    PTSD (post-traumatic stress disorder)    Past Surgical History:  Procedure Laterality Date   CARDIAC CATHETERIZATION N/A 09/12/2015   Procedure: Left Heart Cath and Coronary Angiography;  Surgeon: Lamar Blinks, MD;  Location: [facility] INVASIVE CV LAB;  Service: Cardiovascular;  Laterality: N/A;   CATARACT EXTRACTION W/ INTRAOCULAR LENS IMPLANT Left 05/09/2017   CERVICAL SPINE SURGERY     COLONOSCOPY  2008   CORONARY ANGIOPLASTY WITH STENT PLACEMENT     CORONARY ANGIOPLASTY WITH STENT PLACEMENT     NASAL SINUS SURGERY     SALIVARY GLAND SURGERY Right 2011   oncocytoma   TONSILLECTOMY     Family History  Problem Relation Age of Onset   Hypertension Mother    Stroke Mother    Congestive Heart Failure Father    Social History   Socioeconomic History   Marital status: Married    Spouse name: Not on file   Number of children: 2   Years of education: Not on file   Highest education level: Some college, no degree  Occupational History   Occupation: retired  Tobacco Use   Smoking status: Never   Smokeless tobacco: Never  Vaping Use   Vaping Use: Never used  Substance and Sexual Activity   Alcohol use: No    Alcohol/week: 0.0 standard drinks   Drug use: No   Sexual activity: Not on file  Other Topics Concern   Not on file  Social History Narrative   Lives at home with wife, independent at baseline   Social Determinants of Health   Financial Resource Strain: Low Risk    Difficulty of Paying Living Expenses: Not hard at all  Food Insecurity: No Food Insecurity   Worried About Programme researcher, broadcasting/film/video in the Last Year: Never true   Barista in the Last Year: Never true  Transportation Needs: No Transportation Needs   Lack of Transportation (Medical): No   Lack of Transportation (Non-Medical): No   Physical Activity: Insufficiently Active   Days of Exercise per Week: 5 days   Minutes of Exercise per Session: 20 min  Stress: No Stress Concern Present   Feeling of Stress : Not at all  Social Connections: Moderately Isolated   Frequency of Communication with Friends and Family: More than three times a week   Frequency of Social Gatherings with Friends and Family: Never   Attends Religious Services: Never   Database administrator or Organizations: No   Attends Engineer, structural: Never   Marital Status: Married    Tobacco Counseling Counseling given: Not Answered   Clinical Intake:  Pre-visit preparation completed: Yes  Pain : 0-10 Pain Score: 5  Pain Type: Chronic pain Pain Location: Back Pain Orientation: Upper Pain Descriptors / Indicators: Aching, Constant Pain Onset: More than a month ago Pain Frequency: Constant     Nutritional Risks: None Diabetes: No  How often do you need to have someone  help you when you read instructions, pamphlets, or other written materials from your doctor or pharmacy?: 1 - Never    Interpreter Needed?: No  Information entered by :: Reather Littler LPN   Activities of Daily Living In your present state of health, do you have any difficulty performing the following activities: 05/07/2021 10/23/2020  Hearing? Malvin Johns  Comment wears hearing aids -  Vision? N Y  Difficulty concentrating or making decisions? N N  Walking or climbing stairs? N N  Dressing or bathing? N N  Doing errands, shopping? N N  Preparing Food and eating ? N -  Using the Toilet? N -  In the past six months, have you accidently leaked urine? N -  Do you have problems with loss of bowel control? N -  Managing your Medications? N -  Managing your Finances? N -  Housekeeping or managing your Housekeeping? N -  Some recent data might be hidden    Patient Care Team: Reubin Milan, MD as PCP - General (Internal Medicine) [facility] (Internal  Medicine)  Indicate any recent Medical Services you may have received from other than [facility] providers in the past year (date may be approximate).     Assessment:   This is a routine wellness examination for Dominic Mcmillan.  Hearing/Vision screen Hearing Screening - Comments:: Pt wears hearing aids [facility] Screening - Comments:: Annual vision screenings [facility] Juluis [facility]ire [facility]  Dietary issues and exercise activities discussed: Current Exercise Habits: Home exercise routine, Type of exercise: Other - see comments (exercise bike), Time (Minutes): 20, Frequency (Times/Week): 5, Weekly Exercise (Minutes/Week): 100, Intensity: Moderate, Exercise limited by: None identified   Goals Addressed   None    Depression Screen PHQ 2/9 Scores 05/07/2021 10/23/2020 06/01/2020 05/03/2020 05/03/2019 04/13/2018 02/27/2018  PHQ - 2 Score 0 0 0 0 0 0 0  PHQ- 9 Score - 0 0 - - - -    Fall Risk Fall Risk  05/07/2021 10/23/2020 06/01/2020 05/03/2020 05/03/2019  Falls in the past year? 0 1 1 0 0  Number falls in past yr: 0 0 1 0 0  Injury with Fall? 0 0 1 0 0  Risk for fall due to : No Fall Risks History of fall(s) History of fall(s) No Fall Risks No Fall Risks  Follow up Falls prevention discussed Falls evaluation completed Falls evaluation completed Falls prevention discussed Falls prevention discussed    FALL RISK PREVENTION PERTAINING TO THE HOME:  Any stairs in or around the home? Yes  If so, are there any without handrails? No  Home free of loose throw rugs in walkways, pet beds, electrical cords, etc? Yes  Adequate lighting in your home to reduce risk of falls? Yes   ASSISTIVE DEVICES UTILIZED TO PREVENT FALLS:  Life alert? No  Use of a cane, walker or w/c? No  Grab bars in the bathroom? No  Shower chair or bench in shower? Yes  Elevated toilet seat or a handicapped toilet? Yes   TIMED UP AND GO:  Was the test performed? No . Telephonic visit.   Cognitive Function: Normal cognitive status  assessed by direct observation by this Nurse Health Advisor. No abnormalities found.          Immunizations Immunization History  Administered Date(s) Administered   Fluad Quad(high Dose 65+) 01/09/2020   Influenza, High Dose Seasonal PF 12/26/2016   Influenza,inj,quad, With Preservative 01/16/2021   Influenza-Unspecified 02/17/2007, 01/28/2011, 01/28/2012, 12/28/2012, 12/28/2013, 01/13/2015, 12/05/2015, 12/22/2017, 02/01/2019,  01/18/2020   PFIZER Comirnaty(Gray Top)Covid-19 Tri-Sucrose Vaccine 08/14/2020   PFIZER(Purple Top)SARS-COV-2 Vaccination 05/05/2019, 05/27/2019, 01/26/2020   Pfizer Covid-19 Vaccine Bivalent Booster 6680yrs & up 01/05/2021   Pneumococcal Conjugate-13 05/14/2013   Pneumococcal Polysaccharide-23 09/27/2008, 07/21/2015   Td 12/18/2020   Tdap 03/30/2011   Zoster Recombinat (Shingrix) 07/30/2016, 12/30/2016   Zoster, Live 04/30/2008    TDAP status: Up to date  Flu Vaccine status: Up to date  Pneumococcal vaccine status: Up to date  Covid-19 vaccine status: Completed vaccines  Qualifies for Shingles Vaccine? Yes   Zostavax completed Yes   Shingrix Completed?: Yes  Screening Tests Health Maintenance  Topic Date Due   Hepatitis C Screening  Never done   TETANUS/TDAP  12/19/2030   Pneumonia Vaccine 5065+ Years old  Completed   INFLUENZA VACCINE  Completed   COVID-19 Vaccine  Completed   Zoster Vaccines- Shingrix  Completed   HPV VACCINES  Aged Out    Health Maintenance  Health Maintenance Due  Topic Date Due   Hepatitis C Screening  Never done    Colorectal cancer screening: No longer required.   Lung Cancer Screening: (Low Dose CT Chest recommended if Age 82-80 years, 30 pack-year currently smoking OR have quit w/in 15years.) does not qualify.   Additional Screening:  Hepatitis C Screening: does qualify; postponed  Vision Screening: Recommended annual ophthalmology exams for early detection of glaucoma and other disorders of the eye. Is the  patient up to date with their annual eye exam?  Yes  Who is the provider or what is the name of the office in which the patient attends annual eye exams? [facility].   Dental Screening: Recommended annual dental exams for proper oral hygiene  Community Resource Referral / Chronic Care Management: CRR required this visit?  No   CCM required this visit?  No      Plan:     I have personally reviewed and noted the following in the patients chart:   Medical and social history Use of alcohol, tobacco or illicit drugs  Current medications and supplements including opioid prescriptions. Patient is currently taking opioid prescriptions. Information provided to patient regarding non-opioid alternatives. Patient advised to discuss non-opioid treatment plan with their provider. Functional ability and status Nutritional status Physical activity Advanced directives List of other physicians Hospitalizations, surgeries, and ER visits in previous 12 months Vitals Screenings to include cognitive, depression, and falls Referrals and appointments  In addition, I have reviewed and discussed with patient certain preventive protocols, quality metrics, and best practice recommendations. A written personalized care plan for preventive services as well as general preventive health recommendations were provided to patient.   Due to this being a telephonic visit, the after visit summary with patients personalized plan was offered to patient via my-chart.  Reather LittlerKasey Saba Gomm, LPN   9/1/47821/12/2021   Nurse Notes: none

## 2021-06-04 DIAGNOSIS — Z85828 Personal history of other malignant neoplasm of skin: Secondary | ICD-10-CM | POA: Diagnosis not present

## 2021-06-04 DIAGNOSIS — D485 Neoplasm of uncertain behavior of skin: Secondary | ICD-10-CM | POA: Diagnosis not present

## 2021-06-04 DIAGNOSIS — L905 Scar conditions and fibrosis of skin: Secondary | ICD-10-CM | POA: Diagnosis not present

## 2021-06-04 DIAGNOSIS — L570 Actinic keratosis: Secondary | ICD-10-CM | POA: Diagnosis not present

## 2021-07-11 ENCOUNTER — Encounter: Payer: Self-pay | Admitting: Internal Medicine

## 2021-08-18 ENCOUNTER — Telehealth: Payer: Medicare PPO | Admitting: Nurse Practitioner

## 2021-08-18 DIAGNOSIS — U071 COVID-19: Secondary | ICD-10-CM | POA: Diagnosis not present

## 2021-08-18 MED ORDER — MOLNUPIRAVIR EUA 200MG CAPSULE: 4.0000 | ORAL_CAPSULE | Freq: Two times a day (BID) | ORAL | 0 refills | Status: AC

## 2021-08-18 NOTE — Progress Notes (Signed)
?Virtual Visit Consent  ? ?Dominic Mcmillan, you are scheduled for a virtual visit with a [facility] provider today.   ?  ?Just as with appointments in the office, your consent must be obtained to participate.  Your consent will be active for this visit and any virtual visit you may have with one of our providers in the next 365 days.   ?  ?If you have a MyChart account, a copy of this consent can be sent to you electronically.  All virtual visits are billed to your insurance company just like a traditional visit in the office.   ? ?As this is a virtual visit, video technology does not allow for your provider to perform a traditional examination.  This may limit your provider's ability to fully assess your condition.  If your provider identifies any concerns that need to be evaluated in person or the need to arrange testing (such as labs, EKG, etc.), we will make arrangements to do so.   ?  ?Although advances in technology are sophisticated, we cannot ensure that it will always work on either your end or our end.  If the connection with a video visit is poor, the visit may have to be switched to a telephone visit.  With either a video or telephone visit, we are not always able to ensure that we have a secure connection.    ? ?Also, by engaging in this virtual visit, you consent to the provision of healthcare. Additionally, you authorize for your insurance to be billed (if applicable) for the services provided during this visit.  ? ?I need to obtain your verbal consent now.   Are you willing to proceed with your visit today?  ?  ?Dominic Mcmillan has provided verbal consent on 08/18/2021 for a virtual visit (video or telephone). ?  ?Dominic Rigg, NP  ? ?Date: 08/18/2021 9:05 AM ? ? ?Virtual Visit via Video Note  ? ?Dominic Mcmillan, connected with  Dominic Mcmillan  ([id number], 05/06/1945) on 08/18/21 at  8:30 AM EDT by a video-enabled telemedicine application and verified that I am speaking with  the correct person using two identifiers. ? ?Location: ?Patient: Virtual Visit Location Patient: Home ?Provider: Virtual Visit Location Provider: Home Office ?  ?I discussed the limitations of evaluation and management by telemedicine and the availability of in person appointments. The patient expressed understanding and agreed to proceed.   ? ?History of Present Illness: ?Dominic Mcmillan is a 76 y.o. who identifies as a male who was assigned male at birth, and is being seen today for positive home antigen test for COVID. ? ?Tested positive for COVID this morning.  Onset of symptoms was yesterday: sore throat. Now with laryngitis, cough, and congestion. He is using chlor-trimeton which is providing some relief of symptoms.  ? ?Problems:  ?Patient Active Problem List  ? Diagnosis Date Noted  ? Pulmonary nodules/lesions, multiple 04/30/2018  ? Pure hypercholesterolemia 10/08/2017  ? Primary open angle glaucoma (POAG) of left eye, severe stage 12/18/2016  ? Primary open angle glaucoma (POAG) of right eye, mild stage 12/18/2016  ? Cataract, nuclear sclerotic, both eyes 08/14/2016  ? Sepsis (HCC) 06/04/2016  ? Hyperglycemia 09/12/2015  ? Chest pain with high risk for cardiac etiology 08/31/2015  ? Otitis media 03/28/2015  ? Familial multiple lipoprotein-type hyperlipidemia 02/13/2015  ? Hearing loss 02/13/2015  ? Post-traumatic stress disorder 02/13/2015  ? Coronary artery disease 02/13/2015  ? Degeneration of intervertebral disc of lumbar region  02/13/2015  ? Essential (primary) hypertension 02/13/2015  ? Discoloration of nail 02/13/2015  ? GERD (gastroesophageal reflux disease) 03/14/2014  ? History of cardiac catheterization 03/11/2014  ?  ?Allergies:  ?Allergies  ?Allergen Reactions  ? Timolol Shortness Of Breath  ? Nortriptyline Hcl   ? Prazosin Hcl   ?  joint pain  ? Quetiapine Fumarate   ? Tizanidine   ? Imdur [Isosorbide Dinitrate] Other (See Comments)  ?  Hypotension  ? ?Medications:  ?Current Outpatient  Medications:  ?  molnupiravir EUA (LAGEVRIO) 200 mg CAPS capsule, Take 4 capsules (800 mg total) by mouth 2 (two) times daily for 5 days., Disp: 40 capsule, Rfl: 0 ?  aspirin 81 MG EC tablet, Take 1 tablet by mouth daily., Disp: , Rfl:  ?  Cholecalciferol 50 MCG (2000 UT) CAPS, Take 1 capsule by mouth daily. , Disp: , Rfl:  ?  clopidogrel (PLAVIX) 75 MG tablet, Take 1 tablet by mouth daily., Disp: , Rfl:  ?  co-enzyme Q-10 30 MG capsule, Take 30 mg by mouth 3 (three) times daily., Disp: , Rfl:  ?  dorzolamide (TRUSOPT) 2 % ophthalmic solution, Place 1 drop into the right eye 3 (three) times daily., Disp: , Rfl:  ?  hydrochlorothiazide (HYDRODIURIL) 25 MG tablet, Take 1 tablet by mouth daily., Disp: , Rfl:  ?  HYDROcodone-acetaminophen (NORCO/VICODIN) 5-325 MG tablet, Take 1 tablet by mouth every 6 (six) hours as needed for moderate pain., Disp: , Rfl:  ?  ketoconazole (NIZORAL) 2 % shampoo, KETOCONAZOLE, 2% (External Shampoo) - Historical Medication  (2 %) Active, Disp: , Rfl:  ?  latanoprost (XALATAN) 0.005 % ophthalmic solution, Place 1 drop into both eyes at bedtime. , Disp: , Rfl:  ?  LORazepam (ATIVAN) 1 MG tablet, Take 1 mg by mouth 2 (two) times daily., Disp: , Rfl:  ?  Multiple Vitamins-Minerals (CENTRUM SILVER) tablet, Take 1 tablet by mouth daily. , Disp: , Rfl:  ?  nitroGLYCERIN (NITROSTAT) 0.4 MG SL tablet, Place under the tongue., Disp: , Rfl:  ?  olmesartan (BENICAR) 40 MG tablet, Take 1 tablet by mouth daily., Disp: , Rfl:  ?  vitamin C (ASCORBIC ACID) 500 MG tablet, Take 1,000 mg by mouth daily. , Disp: , Rfl:  ? ?Observations/Objective: ?Patient is well-developed, well-nourished in no acute distress.  ?Resting comfortably at home.  ?Head is normocephalic, atraumatic.  ?No labored breathing.  ?Speech is clear and coherent with logical content.  ?Patient is alert and oriented at baseline.  ? ? ?Assessment and Plan: ?1. Positive self-administered antigen test for COVID-19 ?- molnupiravir EUA (LAGEVRIO)  200 mg CAPS capsule; Take 4 capsules (800 mg total) by mouth 2 (two) times daily for 5 days.  Dispense: 40 capsule; Refill: 0 ?INSTRUCTIONS: use a humidifier for nasal congestion ?Drink plenty of fluids, rest and wash hands frequently to avoid the spread of infection ?Alternate tylenol and Motrin for relief of fever  ? ?Follow Up Instructions: ?I discussed the assessment and treatment plan with the patient. The patient was provided an opportunity to ask questions and all were answered. The patient agreed with the plan and demonstrated an understanding of the instructions.  A copy of instructions were sent to the patient via MyChart unless otherwise noted below.  ? ? ?The patient was advised to call back or seek an in-person evaluation if the symptoms worsen or if the condition fails to improve as anticipated. ? ?Time:  ?I spent 11 minutes with the patient via telehealth technology  discussing the above problems/concerns.   ? ?Dominic RiggZelda W Vivika Poythress, NP  ?

## 2021-08-18 NOTE — Patient Instructions (Signed)
?[name], thank you for joining [name], NP for today's virtual visit.  While this provider is not your primary care provider (PCP), if your PCP is located in our provider database this encounter information will be shared with them immediately following your visit. ? ?Consent: ?(Patient) Dominic Mcmillan provided verbal consent for this virtual visit at the beginning of the encounter. ? ?Current Medications: ? ?Current Outpatient Medications:  ?  molnupiravir EUA (LAGEVRIO) 200 mg CAPS capsule, Take 4 capsules (800 mg total) by mouth 2 (two) times daily for 5 days., Disp: 40 capsule, Rfl: 0 ?  aspirin 81 MG EC tablet, Take 1 tablet by mouth daily., Disp: , Rfl:  ?  Cholecalciferol 50 MCG (2000 UT) CAPS, Take 1 capsule by mouth daily. , Disp: , Rfl:  ?  clopidogrel (PLAVIX) 75 MG tablet, Take 1 tablet by mouth daily., Disp: , Rfl:  ?  co-enzyme Q-10 30 MG capsule, Take 30 mg by mouth 3 (three) times daily., Disp: , Rfl:  ?  dorzolamide (TRUSOPT) 2 % ophthalmic solution, Place 1 drop into the right eye 3 (three) times daily., Disp: , Rfl:  ?  hydrochlorothiazide (HYDRODIURIL) 25 MG tablet, Take 1 tablet by mouth daily., Disp: , Rfl:  ?  HYDROcodone-acetaminophen (NORCO/VICODIN) 5-325 MG tablet, Take 1 tablet by mouth every 6 (six) hours as needed for moderate pain., Disp: , Rfl:  ?  ketoconazole (NIZORAL) 2 % shampoo, KETOCONAZOLE, 2% (External Shampoo) - Historical Medication  (2 %) Active, Disp: , Rfl:  ?  latanoprost (XALATAN) 0.005 % ophthalmic solution, Place 1 drop into both eyes at bedtime. , Disp: , Rfl:  ?  LORazepam (ATIVAN) 1 MG tablet, Take 1 mg by mouth 2 (two) times daily., Disp: , Rfl:  ?  Multiple Vitamins-Minerals (CENTRUM SILVER) tablet, Take 1 tablet by mouth daily. , Disp: , Rfl:  ?  nitroGLYCERIN (NITROSTAT) 0.4 MG SL tablet, Place under the tongue., Disp: , Rfl:  ?  olmesartan (BENICAR) 40 MG tablet, Take 1 tablet by mouth daily., Disp: , Rfl:  ?  vitamin C  (ASCORBIC ACID) 500 MG tablet, Take 1,000 mg by mouth daily. , Disp: , Rfl:   ? ?Medications ordered in this encounter:  ?Meds ordered this encounter  ?Medications  ? molnupiravir EUA (LAGEVRIO) 200 mg CAPS capsule  ?  Sig: Take 4 capsules (800 mg total) by mouth 2 (two) times daily for 5 days.  ?  Dispense:  40 capsule  ?  Refill:  0  ?  Order Specific Question:   Supervising Provider  ?  Answer:   Eber Hong [[id number]]  ?  ? ?*If you need refills on other medications prior to your next appointment, please contact your pharmacy* ? ?Follow-Up: ?Call back or seek an in-person evaluation if the symptoms worsen or if the condition fails to improve as anticipated. ? ?Other Instructions ?Please keep well-hydrated and get plenty of rest. ?Start a saline nasal rinse to flush out your nasal passages. ?You can use plain Mucinex to help thin congestion. ?If you have a humidifier, running in the bedroom at night. ?I want you to start OTC vitamin D3 1000 units daily, vitamin C 1000 mg daily, and a zinc supplement. ?Please take prescribed medications as directed. ?  ?  ?If PCR test positive, please see quarantine instructions below. I also want you to message me via MyChart, using the separate message I have sent you. This way you can communicate directly with me.  ?  ?  You were to quarantine for 5 days from onset of your symptoms.  After day 5, if you have had no fever and you are feeling better, you can end quarantine but need to mask for an additional 5 days. ?After day 5 if you have a fever or are having significant symptoms, please quarantine for full 10 days. ?  ?If you note any worsening of symptoms, any significant shortness of breath or any chest pain, please seek ER evaluation ASAP.  Please do not delay care!  ? ? ?If you have been instructed to have an in-person evaluation today at a local Urgent Care facility, please use the link below. It will take you to a list of all of our available [facility],  including address, phone number and hours of operation. Please do not delay care.  ?[facility] ? ?If you or a family member do not have a primary care provider, use the link below to schedule a visit and establish care. When you choose a [facility] primary care physician or advanced practice provider, you gain a long-term partner in health. ?Find a Primary Care Provider ? ?Learn more about [facility]'s in-office and virtual care options: ?[facility] - Get Care Now  ?

## 2021-08-21 ENCOUNTER — Encounter: Payer: Self-pay | Admitting: Internal Medicine

## 2021-09-14 ENCOUNTER — Other Ambulatory Visit: Payer: Self-pay

## 2021-09-14 ENCOUNTER — Emergency Department
Admission: EM | Admit: 2021-09-14 | Discharge: 2021-09-14 | Disposition: A | Discharge status: Home / Self Care | Payer: Medicare PPO | Attending: Emergency Medicine | Admitting: Emergency Medicine

## 2021-09-14 ENCOUNTER — Emergency Department: Payer: Medicare PPO

## 2021-09-14 DIAGNOSIS — E860 Dehydration: Secondary | ICD-10-CM | POA: Diagnosis not present

## 2021-09-14 DIAGNOSIS — D649 Anemia, unspecified: Secondary | ICD-10-CM | POA: Diagnosis not present

## 2021-09-14 DIAGNOSIS — R61 Generalized hyperhidrosis: Secondary | ICD-10-CM | POA: Diagnosis not present

## 2021-09-14 DIAGNOSIS — R531 Weakness: Secondary | ICD-10-CM | POA: Diagnosis not present

## 2021-09-14 DIAGNOSIS — I10 Essential (primary) hypertension: Secondary | ICD-10-CM | POA: Insufficient documentation

## 2021-09-14 DIAGNOSIS — R42 Dizziness and giddiness: Secondary | ICD-10-CM | POA: Diagnosis not present

## 2021-09-14 DIAGNOSIS — Z7902 Long term (current) use of antithrombotics/antiplatelets: Secondary | ICD-10-CM | POA: Insufficient documentation

## 2021-09-14 DIAGNOSIS — I2510 Atherosclerotic heart disease of native coronary artery without angina pectoris: Secondary | ICD-10-CM | POA: Diagnosis not present

## 2021-09-14 DIAGNOSIS — I959 Hypotension, unspecified: Secondary | ICD-10-CM | POA: Diagnosis not present

## 2021-09-14 LAB — CBC WITH DIFFERENTIAL/PLATELET
Abs Immature Granulocytes: 0.04 10*3/uL (ref 0.00–0.07)
Basophils Absolute: 0 10*3/uL (ref 0.0–0.1)
Basophils Relative: 0 %
Eosinophils Absolute: 0.1 10*3/uL (ref 0.0–0.5)
Eosinophils Relative: 1 %
HCT: 35.6 % — ABNORMAL LOW (ref 39.0–52.0)
Hemoglobin: 11.8 g/dL — ABNORMAL LOW (ref 13.0–17.0)
Immature Granulocytes: 0 %
Lymphocytes Relative: 17 %
Lymphs Abs: 1.8 10*3/uL (ref 0.7–4.0)
MCH: 32.8 pg (ref 26.0–34.0)
MCHC: 33.1 g/dL (ref 30.0–36.0)
MCV: 98.9 fL (ref 80.0–100.0)
Monocytes Absolute: 1.1 10*3/uL — ABNORMAL HIGH (ref 0.1–1.0)
Monocytes Relative: 10 %
Neutro Abs: 7.4 10*3/uL (ref 1.7–7.7)
Neutrophils Relative %: 72 %
Platelets: 119 10*3/uL — ABNORMAL LOW (ref 150–400)
RBC: 3.6 MIL/uL — ABNORMAL LOW (ref 4.22–5.81)
RDW: 13.1 % (ref 11.5–15.5)
WBC: 10.3 10*3/uL (ref 4.0–10.5)
nRBC: 0 % (ref 0.0–0.2)

## 2021-09-14 LAB — TROPONIN I (HIGH SENSITIVITY)
Troponin I (High Sensitivity): 5 ng/L (ref <18)
Troponin I (High Sensitivity): 6 ng/L (ref <18)

## 2021-09-14 LAB — URINALYSIS, COMPLETE (UACMP) WITH MICROSCOPIC
Bilirubin Urine: NEGATIVE
Glucose, UA: NEGATIVE mg/dL
Hgb urine dipstick: NEGATIVE
Ketones, ur: NEGATIVE mg/dL
Leukocytes,Ua: NEGATIVE
Nitrite: NEGATIVE
Protein, ur: NEGATIVE mg/dL
Specific Gravity, Urine: 1.009 (ref 1.005–1.030)
Squamous Epithelial / HPF: NONE SEEN (ref 0–5)
pH: 7 (ref 5.0–8.0)

## 2021-09-14 LAB — COMPREHENSIVE METABOLIC PANEL
ALT: 21 U/L (ref 0–44)
AST: 30 U/L (ref 15–41)
Albumin: 3.6 g/dL (ref 3.5–5.0)
Alkaline Phosphatase: 38 U/L (ref 38–126)
Anion gap: 7 (ref 5–15)
BUN: 19 mg/dL (ref 8–23)
CO2: 28 mmol/L (ref 22–32)
Calcium: 8.4 mg/dL — ABNORMAL LOW (ref 8.9–10.3)
Chloride: 106 mmol/L (ref 98–111)
Creatinine, Ser: 0.7 mg/dL (ref 0.61–1.24)
GFR, Estimated: >60 mL/min (ref >60)
Glucose, Bld: 96 mg/dL (ref 70–99)
Potassium: 3.2 mmol/L — ABNORMAL LOW (ref 3.5–5.1)
Sodium: 141 mmol/L (ref 135–145)
Total Bilirubin: 0.3 mg/dL (ref 0.3–1.2)
Total Protein: 6 g/dL — ABNORMAL LOW (ref 6.5–8.1)

## 2021-09-14 MED ORDER — LACTATED RINGERS IV BOLUS
1000.0000 mL | Freq: Once | INTRAVENOUS | Status: AC
  Administered 2021-09-14: 1000 mL via INTRAVENOUS

## 2021-09-14 MED ORDER — MECLIZINE HCL 25 MG PO TABS: 25.0000 mg | ORAL_TABLET | Freq: Three times a day (TID) | ORAL | 0 refills | Status: DC | PRN

## 2021-09-14 NOTE — ED Triage Notes (Addendum)
Patient BIB EMS for evaluation of sudden onset of dizziness at rest.  Orthostatic for EMS.  Given 1000 mL NS with improvement.  Patient complains of mild dizziness and nausea

## 2021-09-14 NOTE — ED Provider Notes (Signed)
-----------------------------------------   4:43 AM on 09/14/2021 ----------------------------------------- Patient's repeat troponin remains negative.  Medical work-up overall nonrevealing we will discharge with PCP follow-up.   Minna Antis, MD 09/14/21 [phone]

## 2021-09-14 NOTE — ED Provider Notes (Signed)
[facility] Provider Note    Event Date/Time   First MD Initiated Contact with Patient 09/14/21 [phone]     (approximate)   History   Dizziness   HPI  Dominic Mcmillan is a 76 y.o. male with a history of CAD on Plavix, hypertension, hyperlipidemia, PTSD, chronic pain who presents for evaluation of dizziness.  Patient reports that he had a normal day today, ate and drink normally, had supper.  He was sitting on his desk in his room when developed sudden onset of dizziness.  Describes the dizziness as lightheaded, reports that his vision started to go black like he was going to pass out.  He became clammy and diaphoretic.  He was able to crawl into his bed and call his wife.  He then felt the urge of having a BM.  He went to the bathroom and had a small one. EMS was called and when they arrived patient was found to be orthostatic.  EKG and CBG were within normal limits.  Patient received a liter of fluids before arriving to the ED. patient reports feeling improved but still when he stood up in the ED still feeling lightheaded.  He denies room spinning sensation, vertigo, or feeling like he is drunk.  No headache, no chest pain, no changes in vision, no shortness of breath, no abdominal pain, no vomiting or diarrhea, no dysuria or hematuria, no fever or chills     Past Medical History:  Diagnosis Date   Acute MI (HCC)    CAD (coronary artery disease)    Glaucoma    HLD (hyperlipidemia)    HTN (hypertension)    PTSD (post-traumatic stress disorder)     Past Surgical History:  Procedure Laterality Date   CARDIAC CATHETERIZATION N/A 09/12/2015   Procedure: Left Heart Cath and Coronary Angiography;  Surgeon: Lamar Blinks, MD;  Location: [facility] INVASIVE CV LAB;  Service: Cardiovascular;  Laterality: N/A;   CATARACT EXTRACTION W/ INTRAOCULAR LENS IMPLANT Left 05/09/2017   CERVICAL SPINE SURGERY     COLONOSCOPY  2008   CORONARY ANGIOPLASTY WITH STENT PLACEMENT      CORONARY ANGIOPLASTY WITH STENT PLACEMENT     NASAL SINUS SURGERY     SALIVARY GLAND SURGERY Right 2011   oncocytoma   TONSILLECTOMY       Physical Exam   Triage Vital Signs: ED Triage Vitals [09/14/21 0055]  Enc Vitals Group     BP 111/60     Pulse Rate 61     Resp 16     Temp 97.9 F (36.6 C)     Temp Source Oral     SpO2 99 %     Weight 191 lb 5.8 oz (86.8 kg)     Height 5\' 8"  (1.727 m)     Head Circumference      Peak Flow      Pain Score 0     Pain Loc      Pain Edu?      Excl. in GC?     Most recent vital signs: Vitals:   09/14/21 0400 09/14/21 0430  BP: 119/67 112/67  Pulse: 70 78  Resp: (!) 25 18  Temp:    SpO2: 97% 98%     Constitutional: Alert and oriented. Well appearing and in no apparent distress. HEENT:      Head: Normocephalic and atraumatic.         Eyes: Conjunctivae are normal. Sclera is non-icteric. EOMI, PERRL  Mouth/Throat: Mucous membranes are moist.       Neck: Supple with no signs of meningismus. Cardiovascular: Regular rate and rhythm. No murmurs, gallops, or rubs. 2+ symmetrical distal pulses are present in all extremities.  Respiratory: Normal respiratory effort. Lungs are clear to auscultation bilaterally.  Gastrointestinal: Soft, non tender, and non distended with positive bowel sounds. No rebound or guarding. Genitourinary: No CVA tenderness. Musculoskeletal:  No edema, cyanosis, or erythema of extremities. Neurologic: Normal speech and language. Face is symmetric.  Intact strength and sensation x4, no pronator drift, no dysmetria  skin: Skin is warm, dry and intact. No rash noted. Psychiatric: Mood and affect are normal. Speech and behavior are normal.  ED Results / Procedures / Treatments   Labs (all labs ordered are listed, but only abnormal results are displayed) Labs Reviewed  CBC WITH DIFFERENTIAL/PLATELET - Abnormal; Notable for the following components:      Result Value   RBC 3.60 (*)    Hemoglobin 11.8 (*)     HCT 35.6 (*)    Platelets 119 (*)    Monocytes Absolute 1.1 (*)    All other components within normal limits  COMPREHENSIVE METABOLIC PANEL - Abnormal; Notable for the following components:   Potassium 3.2 (*)    Calcium 8.4 (*)    Total Protein 6.0 (*)    All other components within normal limits  URINALYSIS, COMPLETE (UACMP) WITH MICROSCOPIC - Abnormal; Notable for the following components:   Color, Urine YELLOW (*)    APPearance CLEAR (*)    Bacteria, UA RARE (*)    All other components within normal limits  TROPONIN I (HIGH SENSITIVITY)  TROPONIN I (HIGH SENSITIVITY)     EKG  ED ECG REPORT I, Nita Sicklearolina Katalaya Beel, the attending physician, personally viewed and interpreted this ECG.  Sinus rhythm with first-degree AV block, rate of 62, no ST elevations or depressions.  RADIOLOGY I, Nita Sicklearolina Leaann Nevils, attending MD, have personally viewed and interpreted the images obtained during this visit as below:  CT head negative   ___________________________________________________ Interpretation by Radiologist:  CT Head Wo Contrast  Result Date: 09/14/2021 CLINICAL DATA:  Dizziness EXAM: CT HEAD WITHOUT CONTRAST TECHNIQUE: Contiguous axial images were obtained from the base of the skull through the vertex without intravenous contrast. RADIATION DOSE REDUCTION: This exam was performed according to the departmental dose-optimization program which includes automated exposure control, adjustment of the mA and/or kV according to patient size and/or use of iterative reconstruction technique. COMPARISON:  None Available. FINDINGS: Brain: No evidence of acute infarction, hemorrhage, hydrocephalus, extra-axial collection or mass lesion/mass effect. Mild age related atrophy. Subcortical white matter and periventricular small vessel ischemic changes. Vascular: Mild intracranial atherosclerosis. Skull: Normal. Negative for fracture or focal lesion. Sinuses/Orbits: The visualized paranasal sinuses  are essentially clear. The mastoid air cells are unopacified. Other: None. IMPRESSION: No evidence of acute intracranial abnormality. Mild age related atrophy with small vessel ischemic changes. Electronically Signed   By: Charline BillsSriyesh  Krishnan M.D.   On: 09/14/2021 01:26       PROCEDURES:  Critical Care performed: No  Procedures    IMPRESSION / MDM / ASSESSMENT AND PLAN / ED COURSE  I reviewed the triage vital signs and the nursing notes.  76 y.o. male with a history of CAD on Plavix, hypertension, hyperlipidemia, PTSD, chronic pain who presents for evaluation of dizziness.  Patient coming from home with sudden onset of dizziness, lightheadedness found to be orthostatic per EMS.  On arrival to the ED patient feels  improved but still feeling lightheaded after receiving a liter bolus.  Is hemodynamically stable, orthostatic vital signs here are negative.  Patient is completely neurologically intact with a nonfocal exam  Ddx: Dehydration, anemia, vasovagal, cardiac dysrhythmia   Plan: EKG, monitor on telemetry for any signs of dysrhythmias, labs including CBC, CMP, troponin x2, and urinalysis.   MEDICATIONS GIVEN IN ED: Medications  lactated ringers bolus 1,000 mL (0 mLs Intravenous Stopped 09/14/21 0301)     ED COURSE: UA negative for UTI.  EKG with 2 high-sensitivity troponins negative for any demand ischemia.  Patient was monitored on telemetry with no signs of dysrhythmias.  Mild stable anemia, no signs of sepsis, no AKI, no significant electrolyte derangements.  CT head is negative for any acute pathology.  After another liter bolus here patient is now able to ambulate without any assistance, no further episodes of dizziness, he feels better and back to baseline.  With negative work-up, normal vital signs, normal exam, and patient feeling like his symptoms resolved I do feel like patient is safe for discharge home with close follow-up with primary care doctor.  Discussed my standard  return precautions.   Consults: None   EMR reviewed including last visit with his primary care doctor from January 2023 for an annual checkup    FINAL CLINICAL IMPRESSION(S) / ED DIAGNOSES   Final diagnoses:  Dizziness     Rx / DC Orders   ED Discharge Orders          Ordered    meclizine (ANTIVERT) 25 MG tablet  3 times daily PRN        09/14/21 0444             Note:  This document was prepared using Dragon voice recognition software and may include unintentional dictation errors.   Please note:  Patient was evaluated in Emergency Department today for the symptoms described in the history of present illness. Patient was evaluated in the context of the global COVID-19 pandemic, which necessitated consideration that the patient might be at risk for infection with the SARS-CoV-2 virus that causes COVID-19. Institutional protocols and algorithms that pertain to the evaluation of patients at risk for COVID-19 are in a state of rapid change based on information released by regulatory bodies including the CDC and federal and state organizations. These policies and algorithms were followed during the patient's care in the ED.  Some ED evaluations and interventions may be delayed as a result of limited staffing during the pandemic.       Don Perking, [place], MD 09/14/21 [phone]

## 2021-09-14 NOTE — ED Notes (Signed)
Pt c/o increased dizziness after obtaining Orthostatic BP. Obtained additional BP after 2 min lying down- 106/60

## 2021-09-14 NOTE — Discharge Instructions (Signed)
Please follow-up with your doctor by calling later today to arrange a follow-up appointment.  Please take your prescribed meclizine as needed for dizziness, as prescribed.  Return to the emergency department for any worsening dizziness, any weakness or numbness of any arm or leg confusion slurred speech or chest pain.

## 2021-09-19 DIAGNOSIS — R0609 Other forms of dyspnea: Secondary | ICD-10-CM | POA: Diagnosis not present

## 2021-09-19 DIAGNOSIS — R55 Syncope and collapse: Secondary | ICD-10-CM | POA: Diagnosis not present

## 2021-09-19 DIAGNOSIS — I2510 Atherosclerotic heart disease of native coronary artery without angina pectoris: Secondary | ICD-10-CM | POA: Diagnosis not present

## 2021-10-01 ENCOUNTER — Encounter: Payer: Self-pay | Admitting: Internal Medicine

## 2021-11-07 ENCOUNTER — Encounter: Payer: Self-pay | Admitting: Internal Medicine

## 2021-11-09 DIAGNOSIS — Z961 Presence of intraocular lens: Secondary | ICD-10-CM | POA: Diagnosis not present

## 2021-11-09 DIAGNOSIS — H401111 Primary open-angle glaucoma, right eye, mild stage: Secondary | ICD-10-CM | POA: Diagnosis not present

## 2021-11-09 DIAGNOSIS — H401123 Primary open-angle glaucoma, left eye, severe stage: Secondary | ICD-10-CM | POA: Diagnosis not present

## 2021-11-12 ENCOUNTER — Encounter: Payer: Self-pay | Admitting: Internal Medicine

## 2021-11-12 ENCOUNTER — Other Ambulatory Visit: Payer: Self-pay | Admitting: Internal Medicine

## 2021-11-12 DIAGNOSIS — D649 Anemia, unspecified: Secondary | ICD-10-CM

## 2021-11-13 ENCOUNTER — Other Ambulatory Visit: Payer: Self-pay | Admitting: Internal Medicine

## 2021-11-13 DIAGNOSIS — D649 Anemia, unspecified: Secondary | ICD-10-CM

## 2021-11-15 ENCOUNTER — Other Ambulatory Visit
Admission: RE | Admit: 2021-11-15 | Discharge: 2021-11-15 | Disposition: A | Discharge status: Home / Self Care | Payer: Medicare PPO | Attending: Internal Medicine | Admitting: Internal Medicine

## 2021-11-15 DIAGNOSIS — D649 Anemia, unspecified: Secondary | ICD-10-CM | POA: Insufficient documentation

## 2021-11-15 LAB — IRON AND TIBC
Iron: 92 ug/dL (ref 45–182)
Saturation Ratios: 27 % (ref 17.9–39.5)
TIBC: 346 ug/dL (ref 250–450)
UIBC: 254 ug/dL

## 2021-11-15 LAB — VITAMIN B12: Vitamin B-12: 1175 pg/mL — ABNORMAL HIGH (ref 180–914)

## 2021-12-25 DIAGNOSIS — Z86018 Personal history of other benign neoplasm: Secondary | ICD-10-CM | POA: Diagnosis not present

## 2021-12-25 DIAGNOSIS — L578 Other skin changes due to chronic exposure to nonionizing radiation: Secondary | ICD-10-CM | POA: Diagnosis not present

## 2021-12-25 DIAGNOSIS — Z859 Personal history of malignant neoplasm, unspecified: Secondary | ICD-10-CM | POA: Diagnosis not present

## 2021-12-25 DIAGNOSIS — L400 Psoriasis vulgaris: Secondary | ICD-10-CM | POA: Diagnosis not present

## 2021-12-25 DIAGNOSIS — G548 Other nerve root and plexus disorders: Secondary | ICD-10-CM | POA: Diagnosis not present

## 2022-05-08 ENCOUNTER — Ambulatory Visit: Payer: Medicare PPO

## 2022-05-10 ENCOUNTER — Ambulatory Visit: Payer: Medicare PPO

## 2022-05-15 DIAGNOSIS — D692 Other nonthrombocytopenic purpura: Secondary | ICD-10-CM | POA: Diagnosis not present

## 2022-05-20 ENCOUNTER — Ambulatory Visit (INDEPENDENT_AMBULATORY_CARE_PROVIDER_SITE_OTHER): Payer: Medicare PPO

## 2022-05-20 DIAGNOSIS — Z0000 Encounter for general adult medical examination without abnormal findings: Secondary | ICD-10-CM

## 2022-05-20 NOTE — Patient Instructions (Signed)
Health Maintenance, Male Adopting a healthy lifestyle and getting preventive care are important in promoting health and wellness. Ask your health care provider about: The right schedule for you to have regular tests and exams. Things you can do on your own to prevent diseases and keep yourself healthy. What should I know about diet, weight, and exercise? Eat a healthy diet  Eat a diet that includes plenty of vegetables, fruits, low-fat dairy products, and lean protein. Do not eat a lot of foods that are high in solid fats, added sugars, or sodium. Maintain a healthy weight Body mass index (BMI) is a measurement that can be used to identify possible weight problems. It estimates body fat based on height and weight. Your health care provider can help determine your BMI and help you achieve or maintain a healthy weight. Get regular exercise Get regular exercise. This is one of the most important things you can do for your health. Most adults should: Exercise for at least 150 minutes each week. The exercise should increase your heart rate and make you sweat (moderate-intensity exercise). Do strengthening exercises at least twice a week. This is in addition to the moderate-intensity exercise. Spend less time sitting. Even light physical activity can be beneficial. Watch cholesterol and blood lipids Have your blood tested for lipids and cholesterol at 79 years of age, then have this test every 5 years. You may need to have your cholesterol levels checked more often if: Your lipid or cholesterol levels are high. You are older than 79 years of age. You are at high risk for heart disease. What should I know about cancer screening? Many types of cancers can be detected early and may often be prevented. Depending on your health history and family history, you may need to have cancer screening at various ages. This may include screening for: Colorectal cancer. Prostate cancer. Skin cancer. Lung  cancer. What should I know about heart disease, diabetes, and high blood pressure? Blood pressure and heart disease High blood pressure causes heart disease and increases the risk of stroke. This is more likely to develop in people who have high blood pressure readings or are overweight. Talk with your health care provider about your target blood pressure readings. Have your blood pressure checked: Every 3-5 years if you are 18-39 years of age. Every year if you are 40 years old or older. If you are between the ages of 65 and 75 and are a current or former smoker, ask your health care provider if you should have a one-time screening for abdominal aortic aneurysm (AAA). Diabetes Have regular diabetes screenings. This checks your fasting blood sugar level. Have the screening done: Once every three years after age 45 if you are at a normal weight and have a low risk for diabetes. More often and at a younger age if you are overweight or have a high risk for diabetes. What should I know about preventing infection? Hepatitis B If you have a higher risk for hepatitis B, you should be screened for this virus. Talk with your health care provider to find out if you are at risk for hepatitis B infection. Hepatitis C Blood testing is recommended for: Everyone born from 1945 through 1965. Anyone with known risk factors for hepatitis C. Sexually transmitted infections (STIs) You should be screened each year for STIs, including gonorrhea and chlamydia, if: You are sexually active and are younger than 79 years of age. You are older than 79 years of age and your   health care provider tells you that you are at risk for this type of infection. Your sexual activity has changed since you were last screened, and you are at increased risk for chlamydia or gonorrhea. Ask your health care provider if you are at risk. Ask your health care provider about whether you are at high risk for HIV. Your health care provider  may recommend a prescription medicine to help prevent HIV infection. If you choose to take medicine to prevent HIV, you should first get tested for HIV. You should then be tested every 3 months for as long as you are taking the medicine. Follow these instructions at home: Alcohol use Do not drink alcohol if your health care provider tells you not to drink. If you drink alcohol: Limit how much you have to 0-2 drinks a day. Know how much alcohol is in your drink. In the [place] one drink equals one 12 oz bottle of beer (355 mL), one 5 oz glass of wine (148 mL), or one 1 oz glass of hard liquor (44 mL). Lifestyle Do not use any products that contain nicotine or tobacco. These products include cigarettes, chewing tobacco, and vaping devices, such as e-cigarettes. If you need help quitting, ask your health care provider. Do not use street drugs. Do not share needles. Ask your health care provider for help if you need support or information about quitting drugs. General instructions Schedule regular health, dental, and eye exams. Stay current with your vaccines. Tell your health care provider if: You often feel depressed. You have ever been abused or do not feel safe at home. Summary Adopting a healthy lifestyle and getting preventive care are important in promoting health and wellness. Follow your health care provider's instructions about healthy diet, exercising, and getting tested or screened for diseases. Follow your health care provider's instructions on monitoring your cholesterol and blood pressure. This information is not intended to replace advice given to you by your health care provider. Make sure you discuss any questions you have with your health care provider. Document Revised: 09/04/2020 Document Reviewed: 09/04/2020 Elsevier Patient Education  2023 [employer]  

## 2022-05-20 NOTE — Progress Notes (Signed)
I connected with  Dominic Mcmillan on 05/20/22 by a audio enabled telemedicine application and verified that I am speaking with the correct person using two identifiers.  Patient Location: Home  Provider Location: Office/Clinic  I discussed the limitations of evaluation and management by telemedicine. The patient expressed understanding and agreed to proceed.  Subjective:   Dominic Mcmillan is a 77 y.o. male who presents for Medicare Annual/Subsequent preventive examination.  Review of Systems    Per HPI unless specifically indicated below.  Cardiac Risk Factors include: advanced age (>72men, >64 women);male gender,Essential Hypertension, and CAD.           Objective:       09/14/2021    6:17 AM 09/14/2021    4:30 AM 09/14/2021    4:00 AM  Vitals with BMI  Systolic 109 112 993  Diastolic 65 67 67  Pulse 76 78 70    Today's Vitals   05/20/22 0827  PainSc: 4    There is no height or weight on file to calculate BMI.     05/20/2022    8:33 AM 09/14/2021   12:56 AM 05/07/2021   11:31 AM 05/03/2020   10:56 AM 05/03/2019   11:02 AM 07/23/2018    1:06 PM 04/13/2018    9:51 AM  Advanced Directives  Does Patient Have a Medical Advance Directive? No No No Yes Yes No No  Type of Advance Directive Living will   Healthcare Power of [place];Living will Healthcare Power of [place];Living will    Does patient want to make changes to medical advance directive? Yes (MAU/Ambulatory/Procedural Areas - Information given)        Copy of Healthcare Power of Attorney in Chart?    No - copy requested No - copy requested    Would patient like information on creating a medical advance directive? No - Patient declined No - Patient declined Yes (MAU/Ambulatory/Procedural Areas - Information given)   No - Patient declined No - Patient declined    Current Medications (verified) Outpatient Encounter Medications as of 05/20/2022  Medication Sig   aspirin 81 MG EC tablet Take 1 tablet by  mouth daily.   Cholecalciferol 50 MCG (2000 UT) CAPS Take 1 capsule by mouth daily.    clopidogrel (PLAVIX) 75 MG tablet Take 1 tablet by mouth daily.   co-enzyme Q-10 30 MG capsule Take 100 mg by mouth daily.   hydrochlorothiazide (HYDRODIURIL) 25 MG tablet Take 1 tablet by mouth daily.   HYDROcodone-acetaminophen (NORCO/VICODIN) 5-325 MG tablet Take 1 tablet by mouth every 6 (six) hours as needed for moderate pain.   ketoconazole (NIZORAL) 2 % shampoo KETOCONAZOLE, 2% (External Shampoo) - Historical Medication  (2 %) Active   latanoprost (XALATAN) 0.005 % ophthalmic solution Place 1 drop into both eyes at bedtime.    LORazepam (ATIVAN) 1 MG tablet Take 1 mg by mouth 2 (two) times daily.   Multiple Vitamins-Minerals (CENTRUM SILVER) tablet Take 1 tablet by mouth daily.    olmesartan (BENICAR) 40 MG tablet Take 1 tablet by mouth daily.   vitamin C (ASCORBIC ACID) 500 MG tablet Take 1,000 mg by mouth daily.    nitroGLYCERIN (NITROSTAT) 0.4 MG SL tablet Place under the tongue. (Patient not taking: Reported on 05/20/2022)   [DISCONTINUED] meclizine (ANTIVERT) 25 MG tablet Take 1 tablet (25 mg total) by mouth 3 (three) times daily as needed for dizziness. (Patient not taking: Reported on 05/20/2022)   No facility-administered encounter medications on file as of 05/20/2022.  Allergies (verified) Timolol, Methocarbamol, Nortriptyline hcl, Prazosin hcl, Quetiapine fumarate, Tizanidine, and Imdur [isosorbide dinitrate]   History: Past Medical History:  Diagnosis Date   Acute MI (HCC)    CAD (coronary artery disease)    Glaucoma    HLD (hyperlipidemia)    HTN (hypertension)    PTSD (post-traumatic stress disorder)    Sepsis (HCC) 06/04/2016   Past Surgical History:  Procedure Laterality Date   CARDIAC CATHETERIZATION N/A 09/12/2015   Procedure: Left Heart Cath and Coronary Angiography;  Surgeon: Lamar Blinks, MD;  Location: [facility] INVASIVE CV LAB;  Service: Cardiovascular;  Laterality: N/A;    CATARACT EXTRACTION W/ INTRAOCULAR LENS IMPLANT Left 05/09/2017   CERVICAL SPINE SURGERY     COLONOSCOPY  2008   CORONARY ANGIOPLASTY WITH STENT PLACEMENT     CORONARY ANGIOPLASTY WITH STENT PLACEMENT     NASAL SINUS SURGERY     SALIVARY GLAND SURGERY Right 2011   oncocytoma   TONSILLECTOMY     Family History  Problem Relation Age of Onset   Hypertension Mother    Stroke Mother    Congestive Heart Failure Father    Social History   Socioeconomic History   Marital status: Married    Spouse name: Janene Madeira   Number of children: 2   Years of education: Not on file   Highest education level: Some college, no degree  Occupational History   Occupation: retired  Tobacco Use   Smoking status: Never   Smokeless tobacco: Never  Vaping Use   Vaping Use: Never used  Substance and Sexual Activity   Alcohol use: No    Alcohol/week: 0.0 standard drinks of alcohol   Drug use: No   Sexual activity: Not on file  Other Topics Concern   Not on file  Social History Narrative   Lives at home with wife, independent at baseline   Social Determinants of Health   Financial Resource Strain: Low Risk  (05/20/2022)   Overall Financial Resource Strain (CARDIA)    Difficulty of Paying Living Expenses: Not hard at all  Food Insecurity: No Food Insecurity (05/20/2022)   Hunger Vital Sign    Worried About Running Out of Food in the Last Year: Never true    Ran Out of Food in the Last Year: Never true  Transportation Needs: No Transportation Needs (05/20/2022)   PRAPARE - Administrator, Civil Service (Medical): No    Lack of Transportation (Non-Medical): No  Physical Activity: Sufficiently Active (05/20/2022)   Exercise Vital Sign    Days of Exercise per Week: 6 days    Minutes of Exercise per Session: 50 min  Stress: No Stress Concern Present (05/20/2022)   [employer] of Occupational Health - Occupational Stress Questionnaire    Feeling of Stress : Only a little   Social Connections: Moderately Integrated (05/20/2022)   Social Connection and Isolation Panel [NHANES]    Frequency of Communication with Friends and Family: More than three times a week    Frequency of Social Gatherings with Friends and Family: Three times a week    Attends Religious Services: Never    Active Member of Clubs or Organizations: No    Attends Banker Meetings: 1 to 4 times per year    Marital Status: Married    Tobacco Counseling Counseling given: No   Clinical Intake:     Pain : 0-10 Pain Score: 4  Pain Type: Chronic pain Pain Location: Neck Pain Descriptors / Indicators: Aching  Pain Onset: More than a month ago Pain Frequency: Constant Pain Relieving Factors: receive some relief with Hydrocodone APAP Effect of Pain on Daily Activities: moving and lifting his arms make the pain worse  Pain Relieving Factors: receive some relief with Hydrocodone APAP  Nutritional Status: BMI of 19-24  Normal Nutritional Risks: None Diabetes: No  How often do you need to have someone help you when you read instructions, pamphlets, or other written materials from your doctor or pharmacy?: 1 - Never  Diabetic?No      Information entered by :: Donnie Mesa, CMA   Activities of Daily Living    05/20/2022    8:24 AM  In your present state of health, do you have any difficulty performing the following activities:  Hearing? 1  Comment bilateral hearing aids  Vision? 1  Comment glacoma  Difficulty concentrating or making decisions? 0  Walking or climbing stairs? 0  Dressing or bathing? 0  Doing errands, shopping? 0    Patient Care Team: Glean Hess, MD as PCP - General (Internal Medicine) [facility] (Internal Medicine)  Indicate any recent Medical Services you may have received from other than [facility] providers in the past year (date may be approximate). The pt was seen at the [facility] for hearing loss.      Assessment:   This is a routine wellness examination for Dominic Mcmillan.  Hearing/Vision screen Hearing Screening - Comments:: Pt wears hearing aids [facility] by [facility] Vision Screening - Comments:: Annual vision screenings with Dr. Leane Platt at [place] issues and exercise activities discussed: Current Exercise Habits: Structured exercise class, Type of exercise: walking (stationary bike), Time (Minutes): 50, Frequency (Times/Week): 6, Weekly Exercise (Minutes/Week): 300, Intensity: Moderate, Exercise limited by: None identified   Goals Addressed   None    Depression Screen    05/20/2022    8:24 AM 05/07/2021   11:29 AM 10/23/2020    8:45 AM 06/01/2020   11:25 AM 05/03/2020   10:55 AM 05/03/2019   11:08 AM 04/13/2018    9:52 AM  PHQ 2/9 Scores  PHQ - 2 Score 0 0 0 0 0 0 0  PHQ- 9 Score   0 0       Fall Risk    05/20/2022    8:24 AM 05/07/2021   11:33 AM 10/23/2020    8:45 AM 06/01/2020   11:25 AM 05/03/2020   10:59 AM  Fall Risk   Falls in the past year? 0 0 1 1 0  Number falls in past yr: 0 0 0 1 0  Injury with Fall? 0 0 0 1 0  Risk for fall due to : No Fall Risks No Fall Risks History of fall(s) History of fall(s) No Fall Risks  Follow up Falls evaluation completed Falls prevention discussed Falls evaluation completed Falls evaluation completed Falls prevention discussed    FALL RISK PREVENTION PERTAINING TO THE HOME:  Any stairs in or around the home? Yes  If so, are there any without handrails? No  Home free of loose throw rugs in walkways, pet beds, electrical cords, etc? Yes  Adequate lighting in your home to reduce risk of falls? Yes   ASSISTIVE DEVICES UTILIZED TO PREVENT FALLS:  Life alert? No  Use of a cane, walker or w/c? No  Grab bars in the bathroom? No  Shower chair or bench in shower? Yes  Elevated toilet seat or a handicapped toilet? Yes   TIMED UP AND GO:  Was  the test performed?  unable to perform, virtual appt  .  Cognitive Function:        05/20/2022     8:25 AM  6CIT Screen  What Year? 0 points  What time? 0 points  Count back from 20 0 points  Months in reverse 0 points  Repeat phrase 0 points    Immunizations Immunization History  Administered Date(s) Administered   Fluad Quad(high Dose 65+) 01/09/2020   Influenza, High Dose Seasonal PF 12/26/2016   Influenza,inj,quad, With Preservative 01/16/2021   Influenza-Unspecified 02/17/2007, 01/28/2011, 01/28/2012, 12/28/2012, 12/28/2013, 01/13/2015, 12/05/2015, 12/22/2017, 02/01/2019, 01/18/2020   PFIZER Comirnaty(Gray Top)Covid-19 Tri-Sucrose Vaccine 08/14/2020   PFIZER(Purple Top)SARS-COV-2 Vaccination 05/05/2019, 05/27/2019, 01/26/2020   Pfizer Covid-19 Vaccine Bivalent Booster 81yrs & up 01/05/2021   Pneumococcal Conjugate-13 05/14/2013   Pneumococcal Polysaccharide-23 09/27/2008, 07/21/2015   Td 12/18/2020   Tdap 03/30/2011   Zoster Recombinat (Shingrix) 07/30/2016, 12/30/2016   Zoster, Live 04/30/2008    TDAP status: Up to date  Flu Vaccine status: Up to date  Pneumococcal vaccine status: Up to date  Covid-19 vaccine status: Completed vaccines  Qualifies for Shingles Vaccine? Yes   Zostavax completed Yes   Shingrix Completed?: Yes  Screening Tests Health Maintenance  Topic Date Due   Hepatitis C Screening  Never done   Medicare Annual Wellness (AWV)  05/21/2023   DTaP/Tdap/Td (3 - Td or Tdap) 12/19/2030   Pneumonia Vaccine 68+ Years old  Completed   INFLUENZA VACCINE  Completed   COVID-19 Vaccine  Completed   Zoster Vaccines- Shingrix  Completed   HPV VACCINES  Aged Out    Health Maintenance  Health Maintenance Due  Topic Date Due   Hepatitis C Screening  Never done    Colorectal cancer screening: No longer required.   Lung Cancer Screening: (Low Dose CT Chest recommended if Age 14-80 years, 30 pack-year currently smoking OR have quit w/in 15years.) does not qualify.   Lung Cancer Screening Referral: not applicable   Additional Screening:  Hepatitis  C Screening: does qualify; due   Vision Screening: Recommended annual ophthalmology exams for early detection of glaucoma and other disorders of the eye. Is the patient up to date with their annual eye exam?  Yes  Who is the provider or what is the name of the office in which the patient attends annual eye exams? [facility], Dr. Raul Del If pt is not established with a provider, would they like to be referred to a provider to establish care? No .   Dental Screening: Recommended annual dental exams for proper oral hygiene  Community Resource Referral / Chronic Care Management: CRR required this visit?  No   CCM required this visit?  No      Plan:     I have personally reviewed and noted the following in the patient's chart:   Medical and social history Use of alcohol, tobacco or illicit drugs  Current medications and supplements including opioid prescriptions. Patient is currently taking opioid prescriptions. Information provided to patient regarding non-opioid alternatives. Patient advised to discuss non-opioid treatment plan with their provider. Functional ability and status Nutritional status Physical activity Advanced directives List of other physicians Hospitalizations, surgeries, and ER visits in previous 12 months Vitals Screenings to include cognitive, depression, and falls Referrals and appointments  In addition, I have reviewed and discussed with patient certain preventive protocols, quality metrics, and best practice recommendations. A written personalized care plan for preventive services as well as general preventive health recommendations were provided to  patient.     Dominic Mcmillan , Thank you for taking time to come for your Medicare Wellness Visit. I appreciate your ongoing commitment to your health goals. Please review the following plan we discussed and let me know if I can assist you in the future.   These are the goals we discussed:  Goals       Exercise 150 minutes per week (moderate activity)     Continue to remain active        This is a list of the screening recommended for you and due dates:  Health Maintenance  Topic Date Due   Hepatitis C Screening: USPSTF Recommendation to screen - Ages 48-79 yo.  Never done   Medicare Annual Wellness Visit  05/21/2023   DTaP/Tdap/Td vaccine (3 - Td or Tdap) 12/19/2030   Pneumonia Vaccine  Completed   Flu Shot  Completed   COVID-19 Vaccine  Completed   Zoster (Shingles) Vaccine  Completed   HPV Vaccine  Aged [address], [place]   05/20/2022   Nurse Notes: Approximately 30 minute Non-Face -To-Face Medicare Wellness Visit

## 2022-05-25 ENCOUNTER — Ambulatory Visit
Admission: EM | Admit: 2022-05-25 | Discharge: 2022-05-25 | Disposition: A | Discharge status: Home / Self Care | Payer: Medicare PPO | Attending: Emergency Medicine | Admitting: Emergency Medicine

## 2022-05-25 ENCOUNTER — Encounter: Payer: Self-pay | Admitting: Emergency Medicine

## 2022-05-25 DIAGNOSIS — S61012A Laceration without foreign body of left thumb without damage to nail, initial encounter: Secondary | ICD-10-CM | POA: Diagnosis not present

## 2022-05-25 MED ORDER — CEPHALEXIN 500 MG PO CAPS: 500.0000 mg | ORAL_CAPSULE | Freq: Two times a day (BID) | ORAL | 0 refills | Status: AC

## 2022-05-25 NOTE — Discharge Instructions (Addendum)
Leave the dressing in place for the next 24 hours.  After 24 hours remove the dressing, wash the wound with warm water and soap, pat it dry, and apply a thin smear of bacitracin.  Clean the wound daily and apply bacitracin for the first 2 days.  After that a scab should have started to form and you can leave the wound open to air when you are at home and cover with a Band-Aid when you go out in public.  Take the Keflex twice daily for 5 days for prevention of wound infection.  Sutures remain in place for 10 days, please return here or see your primary care provider for removal.  If you develop any redness at the wound site, swelling, pain, drainage, red streaks going up your hand, or fever please return for reevaluation.  

## 2022-05-25 NOTE — ED Triage Notes (Signed)
Patient cut his left thumb with box cutter about a hour ago.  Patient has some bleeding at the site.  Patient is on Plavix and Aspirin.

## 2022-05-25 NOTE — ED Provider Notes (Signed)
[facility]    CSN: [id number] Arrival date & time: 05/25/22  1444      History   Chief Complaint Chief Complaint  Patient presents with   Extremity Laceration    Left thumb    HPI Dominic Mcmillan is a 77 y.o. Dominic.   HPI  77 year old Dominic here for evaluation of left thumb laceration.  The patient reports that approximately hour prior to arrival he was using a box cutter and he cut the backside of his left thumb.  He denies any numbness or tingling he does have full range of motion and sensation of his thumb.  He is on aspirin and Plavix for coronary atherosclerosis.  There is no active bleeding at this time.  His last tetanus shot was in September 2022 at the [place].  Past Medical History:  Diagnosis Date   Acute MI ([place])    CAD (coronary artery disease)    Glaucoma    HLD (hyperlipidemia)    HTN (hypertension)    PTSD (post-traumatic stress disorder)    Sepsis ([place]) 06/04/2016    Patient Active Problem List   Diagnosis Date Noted   Pulmonary nodules/lesions, multiple 04/30/2018   Mild hyperlipidemia 10/08/2017   Primary open angle glaucoma (POAG) of left eye, severe stage 12/18/2016   Primary open angle glaucoma (POAG) of right eye, mild stage 12/18/2016   Hearing loss 02/13/2015   Post-traumatic stress disorder 02/13/2015   Coronary artery disease 02/13/2015   Degeneration of intervertebral disc of lumbar region 02/13/2015   Essential (primary) hypertension 02/13/2015   Discoloration of nail 02/13/2015   GERD (gastroesophageal reflux disease) 03/14/2014   History of cardiac catheterization 03/11/2014    Past Surgical History:  Procedure Laterality Date   CARDIAC CATHETERIZATION N/A 09/12/2015   Procedure: Left Heart Cath and Coronary Angiography;  Surgeon: Corey Skains, MD;  Location: [place] [facility];  Service: Cardiovascular;  Laterality: N/A;   CATARACT EXTRACTION W/ INTRAOCULAR LENS IMPLANT Left 05/09/2017   CERVICAL SPINE SURGERY      COLONOSCOPY  2008   CORONARY ANGIOPLASTY WITH STENT PLACEMENT     CORONARY ANGIOPLASTY WITH STENT PLACEMENT     NASAL SINUS SURGERY     SALIVARY GLAND SURGERY Right 2011   oncocytoma   TONSILLECTOMY         Home Medications    Prior to Admission medications   Medication Sig Start Date End Date Taking? Authorizing Provider  cephALEXin (KEFLEX) 500 MG capsule Take 1 capsule (500 mg total) by mouth 2 (two) times daily for 5 days. 05/25/22 05/30/22 Yes Margarette Canada, NP  aspirin 81 MG EC tablet Take 1 tablet by mouth daily.    [provider]  Cholecalciferol 50 MCG (2000 UT) CAPS Take 1 capsule by mouth daily.     [provider]  clopidogrel (PLAVIX) 75 MG tablet Take 1 tablet by mouth daily.    [provider]  co-enzyme Q-10 30 MG capsule Take 100 mg by mouth daily.    [provider]  hydrochlorothiazide (HYDRODIURIL) 25 MG tablet Take 1 tablet by mouth daily. 04/03/19   [provider]  HYDROcodone-acetaminophen (NORCO/VICODIN) 5-325 MG tablet Take 1 tablet by mouth every 6 (six) hours as needed for moderate pain.    [provider]  ketoconazole (NIZORAL) 2 % shampoo KETOCONAZOLE, 2% (External Shampoo) - Historical Medication  (2 %) Active    [provider]  latanoprost (XALATAN) 0.005 % ophthalmic solution Place 1 drop into both  eyes at bedtime.     [provider]  LORazepam (ATIVAN) 1 MG tablet Take 1 mg by mouth 2 (two) times daily.    [provider]  Multiple Vitamins-Minerals (CENTRUM SILVER) tablet Take 1 tablet by mouth daily.     [provider]  nitroGLYCERIN (NITROSTAT) 0.4 MG SL tablet Place under the tongue. Patient not taking: Reported on 05/20/2022    [provider]  olmesartan (BENICAR) 40 MG tablet Take 1 tablet by mouth daily. 02/28/21   [provider]  vitamin C (ASCORBIC ACID) 500 MG tablet Take 1,000 mg by mouth daily.     [provider]     Family History Family History  Problem Relation Age of Onset   Hypertension Mother    Stroke Mother    Congestive Heart Failure Father     Social History Social History   Tobacco Use   Smoking status: Never   Smokeless tobacco: Never  Vaping Use   Vaping Use: Never used  Substance Use Topics   Alcohol use: No    Alcohol/week: 0.0 standard drinks of alcohol   Drug use: No     Allergies   Timolol, Methocarbamol, Nortriptyline hcl, Prazosin hcl, Quetiapine fumarate, Tizanidine, and Imdur [isosorbide dinitrate]   Review of Systems Review of Systems  Skin:  Positive for wound.  Neurological:  Negative for weakness and numbness.  Hematological: Negative.   Psychiatric/Behavioral: Negative.       Physical Exam Triage Vital Signs ED Triage Vitals  Enc Vitals Group     BP 05/25/22 1511 126/70     Pulse Rate 05/25/22 1511 60     Resp 05/25/22 1511 15     Temp 05/25/22 1511 98 F (36.7 C)     Temp Source 05/25/22 1511 Oral     SpO2 05/25/22 1511 96 %     Weight 05/25/22 1509 191 lb 5.8 oz (86.8 kg)     Height 05/25/22 1509 5\' 8"  (1.727 m)     Head Circumference --      Peak Flow --      Pain Score 05/25/22 1509 4     Pain Loc --      Pain Edu? --      Excl. in [place]? --    No data found.  Updated Vital Signs BP 126/70 (BP Location: Right Arm)   Pulse 60   Temp 98 F (36.7 C) (Oral)   Resp 15   Ht 5\' 8"  (1.727 m)   Wt 191 lb 5.8 oz (86.8 kg)   SpO2 96%   BMI 29.10 kg/m   Visual Acuity Right Eye Distance:   Left Eye Distance:   Bilateral Distance:    Right Eye Near:   Left Eye Near:    Bilateral Near:     Physical Exam Vitals and nursing note reviewed.  Constitutional:      Appearance: Normal appearance.  Musculoskeletal:        General: Normal range of motion.  Skin:    General: Skin is warm and dry.     Capillary Refill: Capillary refill takes less than 2 seconds.  Neurological:     General: No focal deficit present.     Mental Status:  He is alert and oriented to person, place, and time.     Motor: No weakness.  Psychiatric:        Mood and Affect: Mood normal.        Behavior: Behavior normal.  Thought Content: Thought content normal.        Judgment: Judgment normal.      UC Treatments / Results  Labs (all labs ordered are listed, but only abnormal results are displayed) Labs Reviewed - No data to display  EKG   Radiology No results found.  Procedures Procedures (including critical care time)  Medications Ordered in UC Medications - No data to display  Initial Impression / Assessment and Plan / UC Course  I have reviewed the triage vital signs and the nursing notes.  Pertinent labs & imaging results that were available during my care of the patient were reviewed by me and considered in my medical decision making (see chart for details).   Patient is a pleasant, nontoxic-appearing 77 year old Dominic here for evaluation of a 2.5 cm laceration across the dorsal aspect of the MCP joint of the left thumb.  There is no active bleeding at this time.  The laceration does not extend down into the subcutaneous layer and there is no visible exposed tendon or muscle.  His distal sensation is intact and his cap refill is less than 2 seconds.  The wound was anesthetized with 4 mL of 1% lidocaine without epi.  After good anesthesia was a achieved the wound was cleansed with chlorhexidine and saline before being draped in a sterile fashion.  I explored the wound through its full depth to ensure there is no contamination, tendon injury, or muscle injury.  I then closed the wound using 5-0 Ethilon suture with 5 simple interrupted sutures.  The wound was then cleansed again with chlorhexidine and saline and dressed with bacitracin, nonadherent dressing, and Coban.   Final Clinical Impressions(s) / UC Diagnoses   Final diagnoses:  Laceration of left thumb without foreign body without damage to nail, initial encounter      Discharge Instructions      Leave the dressing in place for the next 24 hours.  After 24 hours remove the dressing, wash the wound with warm water and soap, pat it dry, and apply a thin smear of bacitracin.  Clean the wound daily and apply bacitracin for the first 2 days.  After that a scab should have started to form and you can leave the wound open to air when you are at home and cover with a Band-Aid when you go out in public.  Take the Keflex twice daily for 5 days for prevention of wound infection.  Sutures remain in place for 10 days, please return here or see your primary care provider for removal.  If you develop any redness at the wound site, swelling, pain, drainage, red streaks going up your hand, or fever please return for reevaluation.      ED Prescriptions     Medication Sig Dispense Auth. Provider   cephALEXin (KEFLEX) 500 MG capsule Take 1 capsule (500 mg total) by mouth 2 (two) times daily for 5 days. 10 capsule Becky Augusta, NP      PDMP not reviewed this encounter.   Becky Augusta, NP 05/25/22 [phone]

## 2022-07-12 DIAGNOSIS — Z961 Presence of intraocular lens: Secondary | ICD-10-CM | POA: Diagnosis not present

## 2022-07-12 DIAGNOSIS — H401111 Primary open-angle glaucoma, right eye, mild stage: Secondary | ICD-10-CM | POA: Diagnosis not present

## 2022-07-12 DIAGNOSIS — H401123 Primary open-angle glaucoma, left eye, severe stage: Secondary | ICD-10-CM | POA: Diagnosis not present

## 2022-07-18 ENCOUNTER — Encounter: Payer: Self-pay | Admitting: Internal Medicine

## 2022-07-18 ENCOUNTER — Ambulatory Visit: Payer: Medicare PPO | Admitting: Physician Assistant

## 2022-07-18 ENCOUNTER — Encounter: Payer: Self-pay | Admitting: Physician Assistant

## 2022-07-18 VITALS — BP 120/76 | HR 68 | Temp 98.3°F | Ht 68.0 in | Wt 169.0 lb

## 2022-07-18 DIAGNOSIS — J069 Acute upper respiratory infection, unspecified: Secondary | ICD-10-CM | POA: Diagnosis not present

## 2022-07-18 DIAGNOSIS — J0100 Acute maxillary sinusitis, unspecified: Secondary | ICD-10-CM

## 2022-07-18 MED ORDER — DOXYCYCLINE HYCLATE 100 MG PO TABS: 100.0000 mg | ORAL_TABLET | Freq: Two times a day (BID) | ORAL | 0 refills | Status: AC

## 2022-07-18 MED ORDER — GUAIFENESIN-DM 100-10 MG/5ML PO SYRP: 5.0000 mL | ORAL_SOLUTION | ORAL | 0 refills | Status: DC | PRN

## 2022-07-18 MED ORDER — BENZONATATE 100 MG PO CAPS: 200.0000 mg | ORAL_CAPSULE | Freq: Three times a day (TID) | ORAL | 0 refills | Status: AC

## 2022-07-18 NOTE — Patient Instructions (Signed)
-  I believe your cold is viral -take the cough medicines I have sent you for relief of symptoms. -No need for antibiotics at this time, however if no improvement in the next 48h, you may pick up doxycycline at the pharmacy. Do not pick it up if you are feeling better

## 2022-07-18 NOTE — Progress Notes (Signed)
Date:  07/18/2022   Name:  Dominic Mcmillan   DOB:  1945/10/22   MRN:  [id number]   Chief Complaint: Cough  HPI Dominic Mcmillan presents new to me today for evaluation of URI symptoms worsening over the last 5 days including cough, sinus congestion, sore throat, and hoarseness.  Cough is mostly dry but with occasional production.  States nasal mucus is discolored as of yesterday. he has been treating conservatively be at home with cough drops, hydration, green tea with honey but unfortunately he is not improving.  He is not using any OTC medications for this problem.  He is worried that he may have a sinus infection which is "moving into my lungs".  Over the last 24 hours, the hoarseness has improved but the chest congestion and cough have worsened.  Denies fever, chills, NVD  Recent Labs     Component Value Date/Time   NA 141 09/14/2021 0059   K 3.2 (L) 09/14/2021 0059   CL 106 09/14/2021 0059   CO2 28 09/14/2021 0059   GLUCOSE 96 09/14/2021 0059   BUN 19 09/14/2021 0059   CREATININE 0.70 09/14/2021 0059   CALCIUM 8.4 (L) 09/14/2021 0059   PROT 6.0 (L) 09/14/2021 0059   ALBUMIN 3.6 09/14/2021 0059   AST 30 09/14/2021 0059   ALT 21 09/14/2021 0059   ALKPHOS 38 09/14/2021 0059   BILITOT 0.3 09/14/2021 0059   GFRNONAA >60 09/14/2021 0059   GFRAA >60 07/23/2018 1614    Lab Results  Component Value Date   WBC 10.3 09/14/2021   HGB 11.8 (L) 09/14/2021   HCT 35.6 (L) 09/14/2021   MCV 98.9 09/14/2021   PLT 119 (L) 09/14/2021   No results found for: "HGBA1C" No results found for: "CHOL", "HDL", "LDLCALC", "LDLDIRECT", "TRIG", "CHOLHDL" No results found for: "TSH"  Review of Systems  HENT:  Positive for sore throat.   Respiratory:  Positive for cough.     Patient Active Problem List   Diagnosis Date Noted   Pulmonary nodules/lesions, multiple 04/30/2018   Mild hyperlipidemia 10/08/2017   Primary open angle glaucoma (POAG) of left eye, severe stage 12/18/2016   Primary open  angle glaucoma (POAG) of right eye, mild stage 12/18/2016   Hearing loss 02/13/2015   Post-traumatic stress disorder 02/13/2015   Coronary artery disease 02/13/2015   Degeneration of intervertebral disc of lumbar region 02/13/2015   Essential (primary) hypertension 02/13/2015   Discoloration of nail 02/13/2015   GERD (gastroesophageal reflux disease) 03/14/2014   History of cardiac catheterization 03/11/2014    Allergies  Allergen Reactions   Timolol Shortness Of Breath   Methocarbamol Other (See Comments)    Other reaction(s): Melena   Nortriptyline Hcl    Prazosin Hcl     joint pain   Quetiapine Fumarate    Tizanidine    Imdur [Isosorbide Dinitrate] Other (See Comments)    Hypotension    Past Surgical History:  Procedure Laterality Date   CARDIAC CATHETERIZATION N/A 09/12/2015   Procedure: Left Heart Cath and Coronary Angiography;  Surgeon: Corey Skains, MD;  Location: [facility];  Service: Cardiovascular;  Laterality: N/A;   CATARACT EXTRACTION W/ INTRAOCULAR LENS IMPLANT Left 05/09/2017   CERVICAL SPINE SURGERY     COLONOSCOPY  2008   CORONARY ANGIOPLASTY WITH STENT PLACEMENT     CORONARY ANGIOPLASTY WITH STENT PLACEMENT     NASAL SINUS SURGERY     SALIVARY GLAND SURGERY Right 2011   oncocytoma   TONSILLECTOMY  Social History   Tobacco Use   Smoking status: Never   Smokeless tobacco: Never  Vaping Use   Vaping Use: Never used  Substance Use Topics   Alcohol use: No    Alcohol/week: 0.0 standard drinks of alcohol   Drug use: No     Medication list has been reviewed and updated.  Current Meds  Medication Sig   aspirin 81 MG EC tablet Take 1 tablet by mouth daily.   benzonatate (TESSALON) 100 MG capsule Take 2 capsules (200 mg total) by mouth 3 (three) times daily for 10 days.   Cholecalciferol 50 MCG (2000 UT) CAPS Take 1 capsule by mouth daily.    clopidogrel (PLAVIX) 75 MG tablet Take 1 tablet by mouth daily.   dorzolamide (TRUSOPT) 2  % ophthalmic solution INSTILL 1 DROP IN [facility] EYE THREE TIMES A DAY   [START ON 07/20/2022] doxycycline (VIBRA-TABS) 100 MG tablet Take 1 tablet (100 mg total) by mouth 2 (two) times daily for 5 days. Do not take with dairy. This medication INCREASES SUN SENSITIVITY so avoid direct sunlight.   guaiFENesin-dextromethorphan (ROBITUSSIN DM) 100-10 MG/5ML syrup Take 5 mLs by mouth every 4 (four) hours as needed for cough.   hydrochlorothiazide (HYDRODIURIL) 25 MG tablet Take 1 tablet by mouth daily.   HYDROcodone-acetaminophen (NORCO/VICODIN) 5-325 MG tablet Take 1 tablet by mouth every 6 (six) hours as needed for moderate pain.   ketoconazole (NIZORAL) 2 % shampoo KETOCONAZOLE, 2% (External Shampoo) - Historical Medication  (2 %) Active   latanoprost (XALATAN) 0.005 % ophthalmic solution Place 1 drop into both eyes at bedtime.    LORazepam (ATIVAN) 1 MG tablet Take 1 mg by mouth 2 (two) times daily.   Multiple Vitamins-Minerals (CENTRUM SILVER) tablet Take 1 tablet by mouth daily.    olmesartan (BENICAR) 40 MG tablet Take 1 tablet by mouth daily.   rosuvastatin (CRESTOR) 40 MG tablet TAKE ONE-HALF TABLET BY MOUTH AT BEDTIME FOR CHOLESTEROL       07/18/2022   11:12 AM 10/23/2020    8:45 AM 06/01/2020   11:25 AM  GAD 7 : Generalized Anxiety Score  Nervous, Anxious, on Edge 2 3 0  Control/stop worrying 0 0 0  Worry too much - different things 0 0 0  Trouble relaxing 0 0 0  Restless 0 0 0  Easily annoyed or irritable 0 0 0  Afraid - awful might happen 0 0 0  Total GAD 7 Score 2 3 0  Anxiety Difficulty Not difficult at all Not difficult at all Not difficult at all       07/18/2022   11:12 AM 05/20/2022    8:24 AM 05/07/2021   11:29 AM  Depression screen PHQ 2/9  Decreased Interest 0 0 0  Down, Depressed, Hopeless 0 0 0  PHQ - 2 Score 0 0 0  Altered sleeping 0    Tired, decreased energy 0    Change in appetite 0    Feeling bad or failure about yourself  0    Trouble concentrating 0     Moving slowly or fidgety/restless 0    Suicidal thoughts 0    PHQ-9 Score 0    Difficult doing work/chores Not difficult at all      BP Readings from Last 3 Encounters:  07/18/22 120/76  05/25/22 126/70  09/14/21 109/65    Physical Exam Vitals and nursing note reviewed.  Constitutional:      General: He is not in acute distress.  Appearance: Normal appearance.  HENT:     Right Ear: A middle ear effusion is present. Tympanic membrane is not erythematous.     Left Ear: A middle ear effusion is present. Tympanic membrane is not erythematous.     Nose: Nose normal.     Right Sinus: No maxillary sinus tenderness or frontal sinus tenderness.     Left Sinus: No maxillary sinus tenderness or frontal sinus tenderness.     Mouth/Throat:     Pharynx: No oropharyngeal exudate or posterior oropharyngeal erythema.  Eyes:     Conjunctiva/sclera: Conjunctivae normal.  Cardiovascular:     Rate and Rhythm: Normal rate and regular rhythm.  Pulmonary:     Effort: Pulmonary effort is normal.     Breath sounds: Normal breath sounds.     Wt Readings from Last 3 Encounters:  07/18/22 169 lb (76.7 kg)  05/25/22 191 lb 5.8 oz (86.8 kg)  09/14/21 191 lb 5.8 oz (86.8 kg)    BP 120/76   Pulse 68   Temp 98.3 F (36.8 C) (Oral)   Ht 5\' 8"  (1.727 m)   Wt 169 lb (76.7 kg)   SpO2 94%   BMI 25.70 kg/m   Assessment and Plan:  1. Acute URI Likely viral etiology. Discussed self-limited nature of viral illnesses and advised conservative measures including rest, fluids, and OTC cough/cold medications. Contact precautions advised to limit spread. Encouraged mask wearing and good hand hygiene especially before meals. Call if acutely worsening symptoms or if no improvement in 5 days  - benzonatate (TESSALON) 100 MG capsule; Take 2 capsules (200 mg total) by mouth 3 (three) times daily for 10 days.  Dispense: 30 capsule; Refill: 0 - guaiFENesin-dextromethorphan (ROBITUSSIN DM) 100-10 MG/5ML syrup;  Take 5 mLs by mouth every 4 (four) hours as needed for cough.  Dispense: 118 mL; Refill: 0  2. Acute non-recurrent maxillary sinusitis As above, likely viral.  Because the patient has had similar presentation in the past which resulted in pneumonia, will send delayed prescription for doxycycline if needed, available in 2 days.  Patient advised if he is improving by that time, he should not take the antibiotic  - doxycycline (VIBRA-TABS) 100 MG tablet; Take 1 tablet (100 mg total) by mouth 2 (two) times daily for 5 days. Do not take with dairy. This medication INCREASES SUN SENSITIVITY so avoid direct sunlight.  Dispense: 10 tablet; Refill: 0   Return if symptoms worsen or fail to improve.   Partially dictated using Editor, commissioning. Any errors are unintentional.  Lupita Leash, PA-C, [facility]

## 2022-08-17 ENCOUNTER — Encounter: Payer: Self-pay | Admitting: Internal Medicine

## 2022-09-17 ENCOUNTER — Telehealth: Payer: Self-pay | Admitting: Internal Medicine

## 2022-09-17 NOTE — Telephone Encounter (Signed)
Copied from CRM [id number]. Topic: General - Inquiry >> Sep 17, 2022  9:55 AM De Blanch wrote: Reason for CRM: Pt stated he would like his chart market stated never wants to see an NP again. Medicare has a new rule and requires a regular doctor. He must pay a $50.00 co-payment with an NP. Stated no issue with the provider this is due to insurance new regulations.  Please advise.

## 2022-10-17 ENCOUNTER — Encounter: Payer: Self-pay | Admitting: Internal Medicine

## 2022-11-12 DIAGNOSIS — Z961 Presence of intraocular lens: Secondary | ICD-10-CM | POA: Diagnosis not present

## 2022-11-12 DIAGNOSIS — H401111 Primary open-angle glaucoma, right eye, mild stage: Secondary | ICD-10-CM | POA: Diagnosis not present

## 2022-11-12 DIAGNOSIS — H401123 Primary open-angle glaucoma, left eye, severe stage: Secondary | ICD-10-CM | POA: Diagnosis not present

## 2022-12-11 DIAGNOSIS — E782 Mixed hyperlipidemia: Secondary | ICD-10-CM | POA: Diagnosis not present

## 2022-12-11 DIAGNOSIS — I2510 Atherosclerotic heart disease of native coronary artery without angina pectoris: Secondary | ICD-10-CM | POA: Diagnosis not present

## 2022-12-11 DIAGNOSIS — I10 Essential (primary) hypertension: Secondary | ICD-10-CM | POA: Diagnosis not present

## 2022-12-26 DIAGNOSIS — G548 Other nerve root and plexus disorders: Secondary | ICD-10-CM | POA: Diagnosis not present

## 2022-12-26 DIAGNOSIS — Z85828 Personal history of other malignant neoplasm of skin: Secondary | ICD-10-CM | POA: Diagnosis not present

## 2022-12-26 DIAGNOSIS — Z872 Personal history of diseases of the skin and subcutaneous tissue: Secondary | ICD-10-CM | POA: Diagnosis not present

## 2022-12-26 DIAGNOSIS — L578 Other skin changes due to chronic exposure to nonionizing radiation: Secondary | ICD-10-CM | POA: Diagnosis not present

## 2022-12-26 DIAGNOSIS — Z859 Personal history of malignant neoplasm, unspecified: Secondary | ICD-10-CM | POA: Diagnosis not present

## 2022-12-26 DIAGNOSIS — L570 Actinic keratosis: Secondary | ICD-10-CM | POA: Diagnosis not present

## 2022-12-26 DIAGNOSIS — L400 Psoriasis vulgaris: Secondary | ICD-10-CM | POA: Diagnosis not present

## 2022-12-26 DIAGNOSIS — L818 Other specified disorders of pigmentation: Secondary | ICD-10-CM | POA: Diagnosis not present

## 2022-12-26 DIAGNOSIS — Z86018 Personal history of other benign neoplasm: Secondary | ICD-10-CM | POA: Diagnosis not present

## 2023-02-21 ENCOUNTER — Telehealth: Payer: Medicare PPO | Admitting: Nurse Practitioner

## 2023-02-21 ENCOUNTER — Ambulatory Visit: Payer: Self-pay

## 2023-02-21 DIAGNOSIS — J40 Bronchitis, not specified as acute or chronic: Secondary | ICD-10-CM

## 2023-02-21 DIAGNOSIS — J0140 Acute pansinusitis, unspecified: Secondary | ICD-10-CM

## 2023-02-21 MED ORDER — BENZONATATE 100 MG PO CAPS: 100.0000 mg | ORAL_CAPSULE | Freq: Three times a day (TID) | ORAL | 0 refills | Status: DC | PRN

## 2023-02-21 MED ORDER — PREDNISONE 20 MG PO TABS: 20.0000 mg | ORAL_TABLET | Freq: Two times a day (BID) | ORAL | 0 refills | Status: AC

## 2023-02-21 MED ORDER — DOXYCYCLINE HYCLATE 100 MG PO TABS: 100.0000 mg | ORAL_TABLET | Freq: Two times a day (BID) | ORAL | 0 refills | Status: AC

## 2023-02-21 NOTE — Telephone Encounter (Signed)
  Chief Complaint: Chest tightness, Cough, sore throat, sinus drainage, temp 99.9 Symptoms: above Frequency: A few days  - worsening Pertinent Negatives: Patient denies  Disposition: [] ED /[] Urgent Care (no appt availability in office) / [] Appointment(In office/virtual)/ [x]  [facility]/ [] Home Care/ [] Refused Recommended Disposition /[] [facility]/ []  Follow-up with PCP Additional Notes: Pt has had this a few days. He felt better this morning, but is feeling worse now. Chest tightness, raspy voice. No appts in office. Set VV appt for this afternoon.    Reason for Disposition  [1] MILD difficulty breathing (e.g., minimal/no SOB at rest, SOB with walking, pulse <100) AND [2] NEW-onset or WORSE than normal  Answer Assessment - Initial Assessment Questions 1. RESPIRATORY STATUS: "Describe your breathing?" (e.g., wheezing, shortness of breath, unable to speak, severe coughing)      Chest tightness 2. ONSET: "When did this breathing problem begin?"      3 days ago 3. PATTERN "Does the difficult breathing come and go, or has it been constant since it started?"      Constsnt 4. SEVERITY: "How bad is your breathing?" (e.g., mild, moderate, severe)    - MILD: No SOB at rest, mild SOB with walking, speaks normally in sentences, can lie down, no retractions, pulse < 100.    - MODERATE: SOB at rest, SOB with minimal exertion and prefers to sit, cannot lie down flat, speaks in phrases, mild retractions, audible wheezing, pulse 100-120.    - SEVERE: Very SOB at rest, speaks in single words, struggling to breathe, sitting hunched forward, retractions, pulse > 120      Mild- moderate  6. CARDIAC HISTORY: "Do you have any history of heart disease?" (e.g., heart attack, angina, bypass surgery, angioplasty)      MI - stent  8. CAUSE: "What do you think is causing the breathing problem?"      URI 9. OTHER SYMPTOMS: "Do you have any other symptoms? (e.g., dizziness, runny nose, cough,  chest pain, fever)     Runny nose, Cough,  99.62F, sore throat  Protocols used: Breathing Difficulty-A-AH

## 2023-02-21 NOTE — Progress Notes (Signed)
Virtual Visit Consent   Dominic Mcmillan, you are scheduled for a virtual visit with a [facility] provider today. Just as with appointments in the office, your consent must be obtained to participate. Your consent will be active for this visit and any virtual visit you may have with one of our providers in the next 365 days. If you have a MyChart account, a copy of this consent can be sent to you electronically.  As this is a virtual visit, video technology does not allow for your provider to perform a traditional examination. This may limit your provider's ability to fully assess your condition. If your provider identifies any concerns that need to be evaluated in person or the need to arrange testing (such as labs, EKG, etc.), we will make arrangements to do so. Although advances in technology are sophisticated, we cannot ensure that it will always work on either your end or our end. If the connection with a video visit is poor, the visit may have to be switched to a telephone visit. With either a video or telephone visit, we are not always able to ensure that we have a secure connection.  By engaging in this virtual visit, you consent to the provision of healthcare and authorize for your insurance to be billed (if applicable) for the services provided during this visit. Depending on your insurance coverage, you may receive a charge related to this service.  I need to obtain your verbal consent now. Are you willing to proceed with your visit today? Dominic Mcmillan has provided verbal consent on 02/21/2023 for a virtual visit (video or telephone). Viviano Simas, FNP  Date: 02/21/2023 5:56 PM  Virtual Visit via Video Note   I, Viviano Simas, connected with  Dominic Mcmillan  ([id number], 03/10/1946) on 02/21/23 at  6:00 PM EDT by a video-enabled telemedicine application and verified that I am speaking with the correct person using two identifiers.  Location: Patient: Virtual Visit  Location Patient: Home Provider: Virtual Visit Location Provider: Home Office   I discussed the limitations of evaluation and management by telemedicine and the availability of in person appointments. The patient expressed understanding and agreed to proceed.    History of Present Illness: Dominic Mcmillan is a 77 y.o. who identifies as a male who was assigned male at birth, and is being seen today for cough and congestion.   Symptom onset was 4 days ago  Sinus congestion, post nasal drainage  He has been using cough drops Today he is feeling some tightness in his chest with coughing and wheezing  Concerned as this is how is PNA started in the past   He has had pneumonia in the past requiring hospitalization   Monitor vital signs at home    Problems:  Patient Active Problem List   Diagnosis Date Noted   Pulmonary nodules/lesions, multiple 04/30/2018   Mild hyperlipidemia 10/08/2017   Primary open angle glaucoma (POAG) of left eye, severe stage 12/18/2016   Primary open angle glaucoma (POAG) of right eye, mild stage 12/18/2016   Hearing loss 02/13/2015   Post-traumatic stress disorder 02/13/2015   Coronary artery disease 02/13/2015   Degeneration of intervertebral disc of lumbar region 02/13/2015   Essential (primary) hypertension 02/13/2015   Discoloration of nail 02/13/2015   GERD (gastroesophageal reflux disease) 03/14/2014   History of cardiac catheterization 03/11/2014    Allergies:  Allergies  Allergen Reactions   Timolol Shortness Of Breath   Methocarbamol Other (See Comments)  Other reaction(s): Melena   Nortriptyline Hcl    Prazosin Hcl     joint pain   Quetiapine Fumarate    Tizanidine    Imdur [Isosorbide Dinitrate] Other (See Comments)    Hypotension   Medications:  Current Outpatient Medications:    aspirin 81 MG EC tablet, Take 1 tablet by mouth daily., Disp: , Rfl:    Cholecalciferol 50 MCG (2000 UT) CAPS, Take 1 capsule by mouth daily. ,  Disp: , Rfl:    clopidogrel (PLAVIX) 75 MG tablet, Take 1 tablet by mouth daily., Disp: , Rfl:    co-enzyme Q-10 30 MG capsule, Take 100 mg by mouth daily. (Patient not taking: Reported on 07/18/2022), Disp: , Rfl:    dorzolamide (TRUSOPT) 2 % ophthalmic solution, INSTILL 1 DROP IN [facility] EYE THREE TIMES A DAY, Disp: , Rfl:    guaiFENesin-dextromethorphan (ROBITUSSIN DM) 100-10 MG/5ML syrup, Take 5 mLs by mouth every 4 (four) hours as needed for cough., Disp: 118 mL, Rfl: 0   hydrochlorothiazide (HYDRODIURIL) 25 MG tablet, Take 1 tablet by mouth daily., Disp: , Rfl:    HYDROcodone-acetaminophen (NORCO/VICODIN) 5-325 MG tablet, Take 1 tablet by mouth every 6 (six) hours as needed for moderate pain., Disp: , Rfl:    ketoconazole (NIZORAL) 2 % shampoo, KETOCONAZOLE, 2% (External Shampoo) - Historical Medication  (2 %) Active, Disp: , Rfl:    latanoprost (XALATAN) 0.005 % ophthalmic solution, Place 1 drop into both eyes at bedtime. , Disp: , Rfl:    LORazepam (ATIVAN) 1 MG tablet, Take 1 mg by mouth 2 (two) times daily., Disp: , Rfl:    Multiple Vitamins-Minerals (CENTRUM SILVER) tablet, Take 1 tablet by mouth daily. , Disp: , Rfl:    nitroGLYCERIN (NITROSTAT) 0.4 MG SL tablet, Place under the tongue. (Patient not taking: Reported on 05/20/2022), Disp: , Rfl:    olmesartan (BENICAR) 40 MG tablet, Take 1 tablet by mouth daily., Disp: , Rfl:    rosuvastatin (CRESTOR) 40 MG tablet, TAKE ONE-HALF TABLET BY MOUTH AT BEDTIME FOR CHOLESTEROL, Disp: , Rfl:    vitamin C (ASCORBIC ACID) 500 MG tablet, Take 1,000 mg by mouth daily.  (Patient not taking: Reported on 07/18/2022), Disp: , Rfl:   Observations/Objective: Patient is well-developed, well-nourished in no acute distress.  Resting comfortably  at home.  Head is normocephalic, atraumatic.  No labored breathing.  Speech is clear and coherent with logical content.  Patient is alert and oriented at baseline.    Assessment and Plan:    Bronchitis  -  predniSONE (DELTASONE) 20 MG tablet; Take 1 tablet (20 mg total) by mouth 2 (two) times daily with a meal for 5 days.  Dispense: 10 tablet; Refill: 0 - benzonatate (TESSALON) 100 MG capsule; Take 1 capsule (100 mg total) by mouth 3 (three) times daily as needed.  Dispense: 30 capsule; Refill: 0 - doxycycline (VIBRA-TABS) 100 MG tablet; Take 1 tablet (100 mg total) by mouth 2 (two) times daily for 7 days.  Dispense: 14 tablet; Refill: 0    Follow Up Instructions: I discussed the assessment and treatment plan with the patient. The patient was provided an opportunity to ask questions and all were answered. The patient agreed with the plan and demonstrated an understanding of the instructions.  A copy of instructions were sent to the patient via MyChart unless otherwise noted below.    The patient was advised to call back or seek an in-person evaluation if the symptoms worsen or if the condition fails to  improve as anticipated.    Viviano Simas, FNP

## 2023-05-14 ENCOUNTER — Encounter: Payer: Self-pay | Admitting: Internal Medicine

## 2023-05-14 ENCOUNTER — Ambulatory Visit (INDEPENDENT_AMBULATORY_CARE_PROVIDER_SITE_OTHER): Payer: Medicare PPO | Admitting: Internal Medicine

## 2023-05-14 VITALS — BP 112/68 | HR 72 | Temp 97.6°F | Ht 68.0 in | Wt 167.0 lb

## 2023-05-14 DIAGNOSIS — J40 Bronchitis, not specified as acute or chronic: Secondary | ICD-10-CM | POA: Diagnosis not present

## 2023-05-14 DIAGNOSIS — J3489 Other specified disorders of nose and nasal sinuses: Secondary | ICD-10-CM

## 2023-05-14 MED ORDER — AMOXICILLIN-POT CLAVULANATE 875-125 MG PO TABS: 1.0000 | ORAL_TABLET | Freq: Two times a day (BID) | ORAL | 0 refills | Status: AC

## 2023-05-14 NOTE — Progress Notes (Signed)
 Date:  05/14/2023   Name:  Dominic Mcmillan   DOB:  26-Jun-1945   MRN:  [id number]   Chief Complaint: Cough (X 2-3 weeks. Sinus drainage, sore throat, cough, fatigue. No fever.)  Cough This is a new problem. Episode onset: 3 weeks. The problem occurs every few minutes. Associated symptoms include a sore throat. Pertinent negatives include no chest pain, chills, fever, myalgias, nasal congestion or shortness of breath.    Review of Systems  Constitutional:  Positive for fatigue. Negative for chills and fever.  HENT:  Positive for congestion, sinus pressure and sore throat.   Respiratory:  Positive for cough. Negative for shortness of breath.   Cardiovascular:  Negative for chest pain.  Gastrointestinal:  Negative for abdominal pain, diarrhea and nausea.  Musculoskeletal:  Negative for myalgias.  Psychiatric/Behavioral:  Negative for sleep disturbance.      Lab Results  Component Value Date   NA 141 09/14/2021   K 3.2 (L) 09/14/2021   CO2 28 09/14/2021   GLUCOSE 96 09/14/2021   BUN 19 09/14/2021   CREATININE 0.70 09/14/2021   CALCIUM  8.4 (L) 09/14/2021   GFRNONAA >60 09/14/2021   No results found for: "CHOL", "HDL", "LDLCALC", "LDLDIRECT", "TRIG", "CHOLHDL" No results found for: "TSH" No results found for: "HGBA1C" Lab Results  Component Value Date   WBC 10.3 09/14/2021   HGB 11.8 (L) 09/14/2021   HCT 35.6 (L) 09/14/2021   MCV 98.9 09/14/2021   PLT 119 (L) 09/14/2021   Lab Results  Component Value Date   ALT 21 09/14/2021   AST 30 09/14/2021   ALKPHOS 38 09/14/2021   BILITOT 0.3 09/14/2021   No results found for: "25OHVITD2", "25OHVITD3", "VD25OH"   Patient Active Problem List   Diagnosis Date Noted   Pulmonary nodules/lesions, multiple 04/30/2018   Mild hyperlipidemia 10/08/2017   Primary open angle glaucoma (POAG) of left eye, severe stage 12/18/2016   Primary open angle glaucoma (POAG) of right eye, mild stage 12/18/2016   Hearing loss 02/13/2015    Post-traumatic stress disorder 02/13/2015   Coronary artery disease 02/13/2015   Degeneration of intervertebral disc of lumbar region 02/13/2015   Essential (primary) hypertension 02/13/2015   Discoloration of nail 02/13/2015   GERD (gastroesophageal reflux disease) 03/14/2014   History of cardiac catheterization 03/11/2014    Allergies  Allergen Reactions   Timolol  Shortness Of Breath   Methocarbamol Other (See Comments)    Other reaction(s): Melena   Nortriptyline Hcl    Prazosin Hcl     joint pain   Quetiapine Fumarate    Tizanidine    Imdur  [Isosorbide  Dinitrate] Other (See Comments)    Hypotension    Past Surgical History:  Procedure Laterality Date   CARDIAC CATHETERIZATION N/A 09/12/2015   Procedure: Left Heart Cath and Coronary Angiography;  Surgeon: Michelle Aid, MD;  Location: [facility] INVASIVE CV LAB;  Service: Cardiovascular;  Laterality: N/A;   CATARACT EXTRACTION W/ INTRAOCULAR LENS IMPLANT Left 05/09/2017   CERVICAL SPINE SURGERY     COLONOSCOPY  2008   CORONARY ANGIOPLASTY WITH STENT PLACEMENT     CORONARY ANGIOPLASTY WITH STENT PLACEMENT     NASAL SINUS SURGERY     SALIVARY GLAND SURGERY Right 2011   oncocytoma   TONSILLECTOMY      Social History   Tobacco Use   Smoking status: Never   Smokeless tobacco: Never  Vaping Use   Vaping status: Never Used  Substance Use Topics   Alcohol use: No  Alcohol/week: 0.0 standard drinks of alcohol   Drug use: No     Medication list has been reviewed and updated.  Current Meds  Medication Sig   amoxicillin -clavulanate (AUGMENTIN ) 875-125 MG tablet Take 1 tablet by mouth 2 (two) times daily for 7 days.   aspirin  81 MG EC tablet Take 1 tablet by mouth daily.   Cholecalciferol  50 MCG (2000 UT) CAPS Take 1 capsule by mouth daily.    clopidogrel  (PLAVIX ) 75 MG tablet Take 1 tablet by mouth daily.   co-enzyme Q-10 30 MG capsule Take 100 mg by mouth daily.   dorzolamide  (TRUSOPT ) 2 % ophthalmic solution  INSTILL 1 DROP IN EACH EYE THREE TIMES A DAY   hydrochlorothiazide (HYDRODIURIL) 25 MG tablet Take 1 tablet by mouth daily.   HYDROcodone -acetaminophen  (NORCO/VICODIN) 5-325 MG tablet Take 1 tablet by mouth every 6 (six) hours as needed for moderate pain.   ketoconazole (NIZORAL) 2 % shampoo KETOCONAZOLE, 2% (External Shampoo) - Historical Medication  (2 %) Active   latanoprost  (XALATAN ) 0.005 % ophthalmic solution Place 1 drop into both eyes at bedtime.    LORazepam  (ATIVAN ) 1 MG tablet Take 1 mg by mouth 2 (two) times daily.   Multiple Vitamins-Minerals (CENTRUM SILVER) tablet Take 1 tablet by mouth daily.    nitroGLYCERIN  (NITROSTAT ) 0.4 MG SL tablet Place under the tongue.   olmesartan (BENICAR) 40 MG tablet Take 1 tablet by mouth daily.   rosuvastatin  (CRESTOR ) 40 MG tablet TAKE ONE-HALF TABLET BY MOUTH AT BEDTIME FOR CHOLESTEROL   vitamin C  (ASCORBIC ACID ) 500 MG tablet Take 1,000 mg by mouth daily.       05/14/2023    1:58 PM 07/18/2022   11:12 AM 10/23/2020    8:45 AM 06/01/2020   11:25 AM  GAD 7 : Generalized Anxiety Score  Nervous, Anxious, on Edge 0 2 3 0  Control/stop worrying 0 0 0 0  Worry too much - different things 0 0 0 0  Trouble relaxing 0 0 0 0  Restless 0 0 0 0  Easily annoyed or irritable 0 0 0 0  Afraid - awful might happen 0 0 0 0  Total GAD 7 Score 0 2 3 0  Anxiety Difficulty Not difficult at all Not difficult at all Not difficult at all Not difficult at all       05/14/2023    1:58 PM 07/18/2022   11:12 AM 05/20/2022    8:24 AM  Depression screen PHQ 2/9  Decreased Interest 0 0 0  Down, Depressed, Hopeless 0 0 0  PHQ - 2 Score 0 0 0  Altered sleeping  0   Tired, decreased energy  0   Change in appetite  0   Feeling bad or failure about yourself   0   Trouble concentrating  0   Moving slowly or fidgety/restless  0   Suicidal thoughts  0   PHQ-9 Score  0   Difficult doing work/chores  Not difficult at all     BP Readings from Last 3 Encounters:   05/14/23 112/68  07/18/22 120/76  05/25/22 126/70    Physical Exam Vitals and nursing note reviewed.  Constitutional:      General: He is not in acute distress.    Appearance: Normal appearance. He is well-developed.  HENT:     Head: Normocephalic and atraumatic.     Nose:     Right Sinus: No maxillary sinus tenderness or frontal sinus tenderness.     Left Sinus:  No maxillary sinus tenderness or frontal sinus tenderness.     Mouth/Throat:     Pharynx: No oropharyngeal exudate or posterior oropharyngeal erythema.  Cardiovascular:     Rate and Rhythm: Normal rate and regular rhythm.  Pulmonary:     Effort: Pulmonary effort is normal. No respiratory distress.     Breath sounds: Transmitted upper airway sounds present. No wheezing.  Musculoskeletal:     Cervical back: Normal range of motion.  Lymphadenopathy:     Cervical: No cervical adenopathy.  Skin:    General: Skin is warm and dry.     Findings: No rash.  Neurological:     Mental Status: He is alert and oriented to person, place, and time.  Psychiatric:        Mood and Affect: Mood normal.        Behavior: Behavior normal.     Wt Readings from Last 3 Encounters:  05/14/23 167 lb (75.8 kg)  07/18/22 169 lb (76.7 kg)  05/25/22 191 lb 5.8 oz (86.8 kg)    BP 112/68   Pulse 72   Temp 97.6 F (36.4 C) (Oral)   Ht 5\' 8"  (1.727 m)   Wt 167 lb (75.8 kg)   SpO2 96%   BMI 25.39 kg/m   Assessment and Plan:  Problem List Items Addressed This Visit   None Visit Diagnoses       Sinus drainage    -  Primary   recommend Allegra 180 mg daily (sample given)     Bronchitis       Relevant Medications   amoxicillin -clavulanate (AUGMENTIN ) 875-125 MG tablet       No follow-ups on file.    Sheron Dixons, MD [facility] [place]

## 2023-05-18 DIAGNOSIS — R918 Other nonspecific abnormal finding of lung field: Secondary | ICD-10-CM | POA: Diagnosis not present

## 2023-05-18 DIAGNOSIS — Z1152 Encounter for screening for COVID-19: Secondary | ICD-10-CM | POA: Diagnosis not present

## 2023-05-18 DIAGNOSIS — R079 Chest pain, unspecified: Secondary | ICD-10-CM | POA: Diagnosis not present

## 2023-05-18 DIAGNOSIS — R001 Bradycardia, unspecified: Secondary | ICD-10-CM | POA: Diagnosis not present

## 2023-05-18 DIAGNOSIS — Z7982 Long term (current) use of aspirin: Secondary | ICD-10-CM | POA: Diagnosis not present

## 2023-05-18 DIAGNOSIS — R0602 Shortness of breath: Secondary | ICD-10-CM | POA: Diagnosis not present

## 2023-05-18 DIAGNOSIS — J219 Acute bronchiolitis, unspecified: Secondary | ICD-10-CM | POA: Diagnosis not present

## 2023-05-18 DIAGNOSIS — I440 Atrioventricular block, first degree: Secondary | ICD-10-CM | POA: Diagnosis not present

## 2023-05-18 DIAGNOSIS — R0609 Other forms of dyspnea: Secondary | ICD-10-CM | POA: Diagnosis not present

## 2023-05-18 DIAGNOSIS — I080 Rheumatic disorders of both mitral and aortic valves: Secondary | ICD-10-CM | POA: Diagnosis not present

## 2023-05-18 DIAGNOSIS — H401111 Primary open-angle glaucoma, right eye, mild stage: Secondary | ICD-10-CM | POA: Diagnosis not present

## 2023-05-18 DIAGNOSIS — I2510 Atherosclerotic heart disease of native coronary artery without angina pectoris: Secondary | ICD-10-CM | POA: Diagnosis not present

## 2023-05-18 DIAGNOSIS — I10 Essential (primary) hypertension: Secondary | ICD-10-CM | POA: Diagnosis not present

## 2023-05-19 DIAGNOSIS — R0602 Shortness of breath: Secondary | ICD-10-CM | POA: Diagnosis not present

## 2023-05-19 DIAGNOSIS — R0609 Other forms of dyspnea: Secondary | ICD-10-CM | POA: Diagnosis not present

## 2023-05-20 DIAGNOSIS — R0609 Other forms of dyspnea: Secondary | ICD-10-CM | POA: Diagnosis not present

## 2023-05-22 ENCOUNTER — Ambulatory Visit: Payer: Medicare PPO | Admitting: Emergency Medicine

## 2023-05-22 VITALS — Ht 68.0 in | Wt 167.0 lb

## 2023-05-22 DIAGNOSIS — Z0000 Encounter for general adult medical examination without abnormal findings: Secondary | ICD-10-CM | POA: Diagnosis not present

## 2023-05-22 NOTE — Progress Notes (Cosign Needed Addendum)
Subjective:   Dominic Mcmillan is a 78 y.o. male who presents for Medicare Annual/Subsequent preventive examination.  Visit Complete: Virtual I connected with  Dominic Mcmillan on 05/22/23 by a video and audio enabled telemedicine application and verified that I am speaking with the correct person using two identifiers.  Patient Location: Home  Provider Location: Home Office  I discussed the limitations of evaluation and management by telemedicine. The patient expressed understanding and agreed to proceed.  Vital Signs: Because this visit was a virtual/telehealth visit, some criteria may be missing or patient reported. Any vitals not documented were not able to be obtained and vitals that have been documented are patient reported.  Patient Medicare AWV questionnaire was completed by the patient on 05/22/23; I have confirmed that all information answered by patient is correct and no changes since this date.  Cardiac Risk Factors include: advanced age (>43men, >10 women);male gender;hypertension;dyslipidemia;Other (see comment), Risk factor comments: CAD     Objective:    Today's Vitals   05/22/23 0838  Weight: 167 lb (75.8 kg)  Height: 5\' 8"  (1.727 m)  PainSc: 5    Body mass index is 25.39 kg/m.     05/22/2023    8:50 AM 05/25/2022    3:10 PM 05/20/2022    8:33 AM 09/14/2021   12:56 AM 05/07/2021   11:31 AM 05/03/2020   10:56 AM 05/03/2019   11:02 AM  Advanced Directives  Does Patient Have a Medical Advance Directive? No No No No No Yes Yes  Type of Advance Directive   Living will   Healthcare Power of [place];Living will Healthcare Power of [place];Living will  Does patient want to make changes to medical advance directive?   Yes (MAU/Ambulatory/Procedural Areas - Information given)      Copy of Healthcare Power of Attorney in Chart?      No - copy requested No - copy requested  Would patient like information on creating a medical advance directive? Yes  (MAU/Ambulatory/Procedural Areas - Information given)  No - Patient declined No - Patient declined Yes (MAU/Ambulatory/Procedural Areas - Information given)      Current Medications (verified) Outpatient Encounter Medications as of 05/22/2023  Medication Sig   aspirin 81 MG EC tablet Take 1 tablet by mouth daily.   azithromycin (ZITHROMAX) 250 MG tablet Take 1 tablet by mouth daily.   Cholecalciferol 50 MCG (2000 UT) CAPS Take 1 capsule by mouth daily.    clobetasol cream (TEMOVATE) 0.05 % Apply topically. prn   clopidogrel (PLAVIX) 75 MG tablet Take 1 tablet by mouth daily.   co-enzyme Q-10 30 MG capsule Take 100 mg by mouth daily.   cyclobenzaprine (FLEXERIL) 10 MG tablet Take 10 mg by mouth 3 (three) times daily as needed for muscle spasms.   dorzolamide (TRUSOPT) 2 % ophthalmic solution INSTILL 1 DROP IN [facility] EYE THREE TIMES A DAY   hydrochlorothiazide (HYDRODIURIL) 25 MG tablet Take 1 tablet by mouth daily.   HYDROcodone-acetaminophen (NORCO/VICODIN) 5-325 MG tablet Take 1 tablet by mouth every 6 (six) hours as needed for moderate pain.   ketoconazole (NIZORAL) 2 % shampoo KETOCONAZOLE, 2% (External Shampoo) - Historical Medication  (2 %) Active   latanoprost (XALATAN) 0.005 % ophthalmic solution Place 1 drop into both eyes at bedtime.    LORazepam (ATIVAN) 1 MG tablet Take 1 mg by mouth 2 (two) times daily.   Multiple Vitamins-Minerals (CENTRUM SILVER) tablet Take 1 tablet by mouth daily.    olmesartan (BENICAR) 40 MG tablet  Take 1 tablet by mouth daily.   rosuvastatin (CRESTOR) 40 MG tablet TAKE ONE-HALF TABLET BY MOUTH AT BEDTIME FOR CHOLESTEROL   vitamin C (ASCORBIC ACID) 500 MG tablet Take 1,000 mg by mouth daily.   nitroGLYCERIN (NITROSTAT) 0.4 MG SL tablet Place under the tongue. (Patient not taking: Reported on 05/22/2023)   No facility-administered encounter medications on file as of 05/22/2023.    Allergies (verified) Timolol, Isosorbide, Methocarbamol, Nortriptyline hcl,  Prazosin hcl, Quetiapine fumarate, Tizanidine, and Imdur [isosorbide dinitrate]   History: Past Medical History:  Diagnosis Date   Acute MI (HCC)    CAD (coronary artery disease)    Glaucoma    HLD (hyperlipidemia)    HTN (hypertension)    PTSD (post-traumatic stress disorder)    Sepsis (HCC) 06/04/2016   Past Surgical History:  Procedure Laterality Date   CARDIAC CATHETERIZATION N/A 09/12/2015   Procedure: Left Heart Cath and Coronary Angiography;  Surgeon: Lamar Blinks, MD;  Location: [facility] INVASIVE CV LAB;  Service: Cardiovascular;  Laterality: N/A;   CATARACT EXTRACTION W/ INTRAOCULAR LENS IMPLANT Left 05/09/2017   CERVICAL SPINE SURGERY     COLONOSCOPY  2008   CORONARY ANGIOPLASTY WITH STENT PLACEMENT     CORONARY ANGIOPLASTY WITH STENT PLACEMENT     NASAL SINUS SURGERY     SALIVARY GLAND SURGERY Right 2011   oncocytoma   TONSILLECTOMY     Family History  Problem Relation Age of Onset   Hypertension Mother    Stroke Mother    Congestive Heart Failure Father    Social History   Socioeconomic History   Marital status: Married    Spouse name: Janene Madeira   Number of children: 2   Years of education: Not on file   Highest education level: Associate degree: occupational, Scientist, product/process development, or vocational program  Occupational History   Occupation: retired  Tobacco Use   Smoking status: Former    Current packs/day: 0.00    Average packs/day: 1 pack/day for 11.0 years (11.0 ttl pk-yrs)    Types: Cigarettes    Start date: 52    Quit date: 58    Years since quitting: 53.0   Smokeless tobacco: Never  Vaping Use   Vaping status: Never Used  Substance and Sexual Activity   Alcohol use: No    Alcohol/week: 0.0 standard drinks of alcohol   Drug use: No   Sexual activity: Not on file  Other Topics Concern   Not on file  Social History Narrative   Lives at home with wife, independent at baseline   Social Drivers of Health   Financial Resource Strain: Low Risk   (05/22/2023)   Overall Financial Resource Strain (CARDIA)    Difficulty of Paying Living Expenses: Not hard at all  Food Insecurity: No Food Insecurity (05/22/2023)   Hunger Vital Sign    Worried About Running Out of Food in the Last Year: Never true    Ran Out of Food in the Last Year: Never true  Transportation Needs: No Transportation Needs (05/22/2023)   PRAPARE - Administrator, Civil Service (Medical): No    Lack of Transportation (Non-Medical): No  Physical Activity: Inactive (05/22/2023)   Exercise Vital Sign    Days of Exercise per Week: 0 days    Minutes of Exercise per Session: 0 min  Stress: No Stress Concern Present (05/22/2023)   [employer] of Occupational Health - Occupational Stress Questionnaire    Feeling of Stress : Not at all  Social  Connections: Moderately Isolated (05/22/2023)   Social Connection and Isolation Panel [NHANES]    Frequency of Communication with Friends and Family: More than three times a week    Frequency of Social Gatherings with Friends and Family: Once a week    Attends Religious Services: Never    Database administrator or Organizations: No    Attends Engineer, structural: Never    Marital Status: Married    Tobacco Counseling Counseling given: Not Answered   Clinical Intake:  Pre-visit preparation completed: Yes  Pain : 0-10 Pain Score: 5  Pain Type: Chronic pain Pain Location: Neck Pain Descriptors / Indicators: Aching     BMI - recorded: 25.39 Nutritional Status: BMI 25 -29 Overweight Nutritional Risks: None Diabetes: No  How often do you need to have someone help you when you read instructions, pamphlets, or other written materials from your doctor or pharmacy?: 1 - Never  Interpreter Needed?: No  Information entered by :: Tora Kindred, CMA   Activities of Daily Living    05/22/2023    8:40 AM 05/22/2023   12:44 AM  In your present state of health, do you have any difficulty performing the  following activities:  Hearing? 1   Comment wears hearing aids   Vision? 0 0  Difficulty concentrating or making decisions? 0 0  Walking or climbing stairs? 0 0  Dressing or bathing? 0 0  Doing errands, shopping? 0 0  Preparing Food and eating ? N N  Using the Toilet? N N  In the past six months, have you accidently leaked urine? N N  Do you have problems with loss of bowel control? N N  Managing your Medications? N N  Managing your Finances? N N  Housekeeping or managing your Housekeeping? N N    Patient Care Team: Reubin Milan, MD as PCP - General (Internal Medicine) [facility] (Internal Medicine) Arville Care, MD as Referring Physician (Ophthalmology)  Indicate any recent Medical Services you may have received from other than [facility] providers in the past year (date may be approximate).     Assessment:   This is a routine wellness examination for [place].  Hearing/Vision screen Hearing Screening - Comments:: Wears hearing aids Vision Screening - Comments:: Gets eye exams [facility], Dr. Cleotis Nipper (glaucoma)   Goals Addressed             This Visit's Progress    Patient Stated       Get more active outside when the weather gets better      Depression Screen    05/22/2023    8:48 AM 05/14/2023    1:58 PM 07/18/2022   11:12 AM 05/20/2022    8:24 AM 05/07/2021   11:29 AM 10/23/2020    8:45 AM 06/01/2020   11:25 AM  PHQ 2/9 Scores  PHQ - 2 Score 0 0 0 0 0 0 0  PHQ- 9 Score   0   0 0    Fall Risk    05/22/2023    8:53 AM 05/22/2023   12:44 AM 05/14/2023    1:58 PM 07/18/2022   11:12 AM 05/20/2022    8:24 AM  Fall Risk   Falls in the past year? 0 0 0 0 0  Number falls in past yr: 0  0 0 0  Injury with Fall? 0  0 0 0  Risk for fall due to : No Fall Risks  No Fall Risks No Fall Risks No  Fall Risks  Follow up Falls prevention discussed  Falls evaluation completed Falls evaluation completed Falls evaluation completed    MEDICARE RISK AT HOME: Medicare Risk  at Home Any stairs in or around the home?: Yes If so, are there any without handrails?: No Home free of loose throw rugs in walkways, pet beds, electrical cords, etc?: Yes Adequate lighting in your home to reduce risk of falls?: Yes Life alert?: No Use of a cane, walker or w/c?: No Grab bars in the bathroom?: No Shower chair or bench in shower?: Yes Elevated toilet seat or a handicapped toilet?: Yes  TIMED UP AND GO:  Was the test performed?  No    Cognitive Function:        05/22/2023    8:53 AM 05/20/2022    8:25 AM  6CIT Screen  What Year? 0 points 0 points  What month? 0 points   What time? 0 points 0 points  Count back from 20 2 points 0 points  Months in reverse 0 points 0 points  Repeat phrase 0 points 0 points  Total Score 2 points     Immunizations Immunization History  Administered Date(s) Administered   Fluad Quad(high Dose 65+) 01/09/2020   Influenza, High Dose Seasonal PF 12/26/2016, 01/13/2023   Influenza,inj,quad, With Preservative 01/16/2021   Influenza-Unspecified 02/17/2007, 01/28/2011, 01/28/2012, 12/28/2012, 12/28/2013, 01/13/2015, 12/05/2015, 12/22/2017, 02/01/2019, 01/18/2020   PFIZER Comirnaty(Gray Top)Covid-19 Tri-Sucrose Vaccine 08/14/2020   PFIZER(Purple Top)SARS-COV-2 Vaccination 05/05/2019, 05/27/2019, 01/26/2020   Pfizer Covid-19 Vaccine Bivalent Booster 19yrs & up 01/05/2021   Pfizer(Comirnaty)Fall Seasonal Vaccine 12 years and older 02/08/2022, 01/13/2023   Pneumococcal Conjugate-13 05/14/2013   Pneumococcal Polysaccharide-23 09/27/2008, 07/21/2015   Td 12/18/2020   Tdap 03/30/2011   Zoster Recombinant(Shingrix) 07/30/2016, 12/30/2016   Zoster, Live 04/30/2008    TDAP status: Up to date  Flu Vaccine status: Up to date  Pneumococcal vaccine status: Up to date  Covid-19 vaccine status: Completed vaccines  Qualifies for Shingles Vaccine? Yes   Zostavax completed Yes   Shingrix Completed?: Yes  Screening Tests Health Maintenance   Topic Date Due   Hepatitis C Screening  Never done   Medicare Annual Wellness (AWV)  05/21/2024   DTaP/Tdap/Td (3 - Td or Tdap) 12/19/2030   Pneumonia Vaccine 24+ Years old  Completed   INFLUENZA VACCINE  Completed   COVID-19 Vaccine  Completed   Zoster Vaccines- Shingrix  Completed   HPV VACCINES  Aged Out    Health Maintenance  Health Maintenance Due  Topic Date Due   Hepatitis C Screening  Never done    Colorectal cancer screening: No longer required.   Lung Cancer Screening: (Low Dose CT Chest recommended if Age 60-80 years, 20 pack-year currently smoking OR have quit w/in 15years.) does not qualify.   Lung Cancer Screening Referral: n/a  Additional Screening:  Hepatitis C Screening: does not qualify;   Vision Screening: Recommended annual ophthalmology exams for early detection of glaucoma and other disorders of the eye.  Dental Screening: Recommended annual dental exams for proper oral hygiene   Community Resource Referral / Chronic Care Management: CRR required this visit?  No   CCM required this visit?  No     Plan:     I have personally reviewed and noted the following in the patient's chart:   Medical and social history Use of alcohol, tobacco or illicit drugs  Current medications and supplements including opioid prescriptions. Patient is currently taking opioid prescriptions. Information provided to patient regarding non-opioid alternatives.  Patient advised to discuss non-opioid treatment plan with their provider. Functional ability and status Nutritional status Physical activity Advanced directives List of other physicians Hospitalizations, surgeries, and ER visits in previous 12 months Vitals Screenings to include cognitive, depression, and falls Referrals and appointments  In addition, I have reviewed and discussed with patient certain preventive protocols, quality metrics, and best practice recommendations. A written personalized care plan  for preventive services as well as general preventive health recommendations were provided to patient.     Tora Kindred, CMA   05/22/2023   After Visit Summary: (MyChart) Due to this being a telephonic visit, the after visit summary with patients personalized plan was offered to patient via MyChart   Nurse Notes:  6 CIT Score - 2

## 2023-05-22 NOTE — Patient Instructions (Signed)
Dominic Mcmillan , Thank you for taking time to come for your Medicare Wellness Visit. I appreciate your ongoing commitment to your health goals. Please review the following plan we discussed and let me know if I can assist you in the future.   Referrals/Orders/Follow-Ups/Clinician Recommendations: Keep up the good work!!  This is a list of the screening recommended for you and due dates:  Health Maintenance  Topic Date Due   Hepatitis C Screening  Never done   Medicare Annual Wellness Visit  05/21/2024   DTaP/Tdap/Td vaccine (3 - Td or Tdap) 12/19/2030   Pneumonia Vaccine  Completed   Flu Shot  Completed   COVID-19 Vaccine  Completed   Zoster (Shingles) Vaccine  Completed   HPV Vaccine  Aged Out    Advanced directives: (ACP Link)Information on Advanced Care Planning can be found at [facility] Advance Health Care Directives Advance Health Care Directives ([email or web address]   Next Medicare Annual Wellness Visit scheduled for next year: No, Practice is not accepting his insurance after Feb 2025 per patient.  Managing Pain Without Opioids Opioids are strong medicines used to treat moderate to severe pain. For some people, especially those who have long-term (chronic) pain, opioids may not be the best choice for pain management due to: Side effects like nausea, constipation, and sleepiness. The risk of addiction (opioid use disorder). The longer you take opioids, the greater your risk of addiction. Pain that lasts for more than 3 months is called chronic pain. Managing chronic pain usually requires more than one approach and is often provided by a team of health care providers working together (multidisciplinary approach). Pain management may be done at a pain management center or pain clinic. How to manage pain without the use of opioids Use non-opioid medicines Non-opioid medicines for pain may include: Over-the-counter or prescription non-steroidal anti-inflammatory drugs  (NSAIDs). These may be the first medicines used for pain. They work well for muscle and bone pain, and they reduce swelling. Acetaminophen. This over-the-counter medicine may work well for milder pain but not swelling. Antidepressants. These may be used to treat chronic pain. A certain type of antidepressant (tricyclics) is often used. These medicines are given in lower doses for pain than when used for depression. Anticonvulsants. These are usually used to treat seizures but may also reduce nerve (neuropathic) pain. Muscle relaxants. These relieve pain caused by sudden muscle tightening (spasms). You may also use a pain medicine that is applied to the skin as a patch, cream, or gel (topical analgesic), such as a numbing medicine. These may cause fewer side effects than medicines taken by mouth. Do certain therapies as directed Some therapies can help with pain management. They include: Physical therapy. You will do exercises to gain strength and flexibility. A physical therapist may teach you exercises to move and stretch parts of your body that are weak, stiff, or painful. You can learn these exercises at physical therapy visits and practice them at home. Physical therapy may also involve: Massage. Heat wraps or applying heat or cold to affected areas. Electrical signals that interrupt pain signals (transcutaneous electrical nerve stimulation, TENS). Weak lasers that reduce pain and swelling (low-level laser therapy). Signals from your body that help you learn to regulate pain (biofeedback). Occupational therapy. This helps you to learn ways to function at home and work with less pain. Recreational therapy. This involves trying new activities or hobbies, such as a physical activity or drawing. Mental health therapy, including: Cognitive behavioral therapy (CBT).  This helps you learn coping skills for dealing with pain. Acceptance and commitment therapy (ACT) to change the way you think and react  to pain. Relaxation therapies, including muscle relaxation exercises and mindfulness-based stress reduction. Pain management counseling. This may be individual, family, or group counseling.  Receive medical treatments Medical treatments for pain management include: Nerve block injections. These may include a pain blocker and anti-inflammatory medicines. You may have injections: Near the spine to relieve chronic back or neck pain. Into joints to relieve back or joint pain. Into nerve areas that supply a painful area to relieve body pain. Into muscles (trigger point injections) to relieve some painful muscle conditions. A medical device placed near your spine to help block pain signals and relieve nerve pain or chronic back pain (spinal cord stimulation device). Acupuncture. Follow these instructions at home Medicines Take over-the-counter and prescription medicines only as told by your health care provider. If you are taking pain medicine, ask your health care providers about possible side effects to watch out for. Do not drive or use heavy machinery while taking prescription opioid pain medicine. Lifestyle  Do not use drugs or alcohol to reduce pain. If you drink alcohol, limit how much you have to: 0-1 drink a day for women who are not pregnant. 0-2 drinks a day for men. Know how much alcohol is in a drink. In the [place] one drink equals one 12 oz bottle of beer (355 mL), one 5 oz glass of wine (148 mL), or one 1 oz glass of hard liquor (44 mL). Do not use any products that contain nicotine or tobacco. These products include cigarettes, chewing tobacco, and vaping devices, such as e-cigarettes. If you need help quitting, ask your health care provider. Eat a healthy diet and maintain a healthy weight. Poor diet and excess weight may make pain worse. Eat foods that are high in fiber. These include fresh fruits and vegetables, whole grains, and beans. Limit foods that are high in fat and  processed sugars, such as fried and sweet foods. Exercise regularly. Exercise lowers stress and may help relieve pain. Ask your health care provider what activities and exercises are safe for you. If your health care provider approves, join an exercise class that combines movement and stress reduction. Examples include yoga and tai chi. Get enough sleep. Lack of sleep may make pain worse. Lower stress as much as possible. Practice stress reduction techniques as told by your therapist. General instructions Work with all your pain management providers to find the treatments that work best for you. You are an important member of your pain management team. There are many things you can do to reduce pain on your own. Consider joining an online or in-person support group for people who have chronic pain. Keep all follow-up visits. This is important. Where to find more information You can find more information about managing pain without opioids from: American Academy of Pain Medicine: painmed.org Institute for Chronic Pain: instituteforchronicpain.org American Chronic Pain Association: theacpa.org Contact a health care provider if: You have side effects from pain medicine. Your pain gets worse or does not get better with treatments or home therapy. You are struggling with anxiety or depression. Summary Many types of pain can be managed without opioids. Chronic pain may respond better to pain management without opioids. Pain is best managed when you and a team of health care providers work together. Pain management without opioids may include non-opioid medicines, medical treatments, physical therapy, mental health therapy, and  lifestyle changes. Tell your health care providers if your pain gets worse or is not being managed well enough. This information is not intended to replace advice given to you by your health care provider. Make sure you discuss any questions you have with your health care  provider. Document Revised: 07/26/2020 Document Reviewed: 07/26/2020 Elsevier Patient Education  2024 [employer].

## 2023-08-04 DIAGNOSIS — H401111 Primary open-angle glaucoma, right eye, mild stage: Secondary | ICD-10-CM | POA: Diagnosis not present

## 2023-09-16 DIAGNOSIS — H401111 Primary open-angle glaucoma, right eye, mild stage: Secondary | ICD-10-CM | POA: Diagnosis not present

## 2023-09-16 DIAGNOSIS — H401123 Primary open-angle glaucoma, left eye, severe stage: Secondary | ICD-10-CM | POA: Diagnosis not present

## 2023-09-16 DIAGNOSIS — Z961 Presence of intraocular lens: Secondary | ICD-10-CM | POA: Diagnosis not present

## 2023-10-15 DIAGNOSIS — M549 Dorsalgia, unspecified: Secondary | ICD-10-CM | POA: Diagnosis not present

## 2023-10-15 DIAGNOSIS — I493 Ventricular premature depolarization: Secondary | ICD-10-CM | POA: Diagnosis not present

## 2023-10-15 DIAGNOSIS — I2510 Atherosclerotic heart disease of native coronary artery without angina pectoris: Secondary | ICD-10-CM | POA: Diagnosis not present

## 2023-10-15 DIAGNOSIS — Z7982 Long term (current) use of aspirin: Secondary | ICD-10-CM | POA: Diagnosis not present

## 2023-10-15 DIAGNOSIS — G8929 Other chronic pain: Secondary | ICD-10-CM | POA: Diagnosis not present

## 2023-10-15 DIAGNOSIS — I10 Essential (primary) hypertension: Secondary | ICD-10-CM | POA: Diagnosis not present

## 2023-10-15 DIAGNOSIS — E785 Hyperlipidemia, unspecified: Secondary | ICD-10-CM | POA: Diagnosis not present

## 2023-10-15 DIAGNOSIS — Z87891 Personal history of nicotine dependence: Secondary | ICD-10-CM | POA: Diagnosis not present

## 2023-10-15 DIAGNOSIS — R001 Bradycardia, unspecified: Secondary | ICD-10-CM | POA: Diagnosis not present

## 2023-10-28 DIAGNOSIS — H401123 Primary open-angle glaucoma, left eye, severe stage: Secondary | ICD-10-CM | POA: Diagnosis not present

## 2023-10-28 DIAGNOSIS — Z961 Presence of intraocular lens: Secondary | ICD-10-CM | POA: Diagnosis not present

## 2023-10-28 DIAGNOSIS — H401111 Primary open-angle glaucoma, right eye, mild stage: Secondary | ICD-10-CM | POA: Diagnosis not present

## 2023-11-04 DIAGNOSIS — R002 Palpitations: Secondary | ICD-10-CM | POA: Diagnosis not present

## 2023-12-02 DIAGNOSIS — H401123 Primary open-angle glaucoma, left eye, severe stage: Secondary | ICD-10-CM | POA: Diagnosis not present

## 2023-12-02 DIAGNOSIS — Z961 Presence of intraocular lens: Secondary | ICD-10-CM | POA: Diagnosis not present

## 2023-12-02 DIAGNOSIS — H401111 Primary open-angle glaucoma, right eye, mild stage: Secondary | ICD-10-CM | POA: Diagnosis not present

## 2023-12-19 ENCOUNTER — Ambulatory Visit: Admitting: Internal Medicine

## 2023-12-31 DIAGNOSIS — Z85828 Personal history of other malignant neoplasm of skin: Secondary | ICD-10-CM | POA: Diagnosis not present

## 2023-12-31 DIAGNOSIS — Z86018 Personal history of other benign neoplasm: Secondary | ICD-10-CM | POA: Diagnosis not present

## 2023-12-31 DIAGNOSIS — L280 Lichen simplex chronicus: Secondary | ICD-10-CM | POA: Diagnosis not present

## 2023-12-31 DIAGNOSIS — G548 Other nerve root and plexus disorders: Secondary | ICD-10-CM | POA: Diagnosis not present

## 2023-12-31 DIAGNOSIS — Z859 Personal history of malignant neoplasm, unspecified: Secondary | ICD-10-CM | POA: Diagnosis not present

## 2023-12-31 DIAGNOSIS — L400 Psoriasis vulgaris: Secondary | ICD-10-CM | POA: Diagnosis not present

## 2023-12-31 DIAGNOSIS — L578 Other skin changes due to chronic exposure to nonionizing radiation: Secondary | ICD-10-CM | POA: Diagnosis not present

## 2023-12-31 DIAGNOSIS — Z872 Personal history of diseases of the skin and subcutaneous tissue: Secondary | ICD-10-CM | POA: Diagnosis not present

## 2023-12-31 DIAGNOSIS — L570 Actinic keratosis: Secondary | ICD-10-CM | POA: Diagnosis not present

## 2024-02-10 ENCOUNTER — Encounter: Payer: Self-pay | Admitting: Internal Medicine

## 2024-02-10 ENCOUNTER — Ambulatory Visit: Admitting: Internal Medicine

## 2024-02-10 VITALS — BP 122/68 | HR 59 | Ht 68.0 in | Wt 170.0 lb

## 2024-02-10 DIAGNOSIS — F4310 Post-traumatic stress disorder, unspecified: Secondary | ICD-10-CM

## 2024-02-10 DIAGNOSIS — I2510 Atherosclerotic heart disease of native coronary artery without angina pectoris: Secondary | ICD-10-CM

## 2024-02-10 DIAGNOSIS — M26621 Arthralgia of right temporomandibular joint: Secondary | ICD-10-CM | POA: Diagnosis not present

## 2024-02-10 DIAGNOSIS — I10 Essential (primary) hypertension: Secondary | ICD-10-CM | POA: Diagnosis not present

## 2024-02-10 DIAGNOSIS — E785 Hyperlipidemia, unspecified: Secondary | ICD-10-CM | POA: Diagnosis not present

## 2024-02-10 NOTE — Progress Notes (Signed)
 Date:  02/10/2024   Name:  Dominic Mcmillan   DOB:  02-18-1946   MRN:  [id number]   Chief Complaint: Ear Pain (Rt ear pain. Hx of ear infections. Hurts inside of ear, and when moving jaw. Started last Thursday- X 5 days ago. )  Otalgia  There is pain in the right ear. This is a new problem. The current episode started in the past 7 days. The problem occurs constantly. The problem has been gradually worsening. There has been no fever. The pain is moderate. Pertinent negatives include no abdominal pain, coughing, diarrhea, headaches or sore throat.    Review of Systems  Constitutional:  Negative for appetite change, fatigue and unexpected weight change.  HENT:  Positive for ear pain. Negative for congestion, nosebleeds and sore throat.   Eyes:  Negative for visual disturbance.  Respiratory:  Negative for cough, chest tightness, shortness of breath and wheezing.   Cardiovascular:  Negative for chest pain, palpitations and leg swelling.  Gastrointestinal:  Negative for abdominal pain, constipation and diarrhea.  Neurological:  Negative for dizziness, weakness, light-headedness and headaches.  Psychiatric/Behavioral:  Negative for dysphoric mood and sleep disturbance. The patient is not nervous/anxious.      Lab Results  Component Value Date   NA 141 09/14/2021   K 3.2 (L) 09/14/2021   CO2 28 09/14/2021   GLUCOSE 96 09/14/2021   BUN 19 09/14/2021   CREATININE 0.70 09/14/2021   CALCIUM  8.4 (L) 09/14/2021   GFRNONAA >60 09/14/2021   No results found for: CHOL, HDL, LDLCALC, LDLDIRECT, TRIG, CHOLHDL No results found for: TSH No results found for: HGBA1C Lab Results  Component Value Date   WBC 10.3 09/14/2021   HGB 11.8 (L) 09/14/2021   HCT 35.6 (L) 09/14/2021   MCV 98.9 09/14/2021   PLT 119 (L) 09/14/2021   Lab Results  Component Value Date   ALT 21 09/14/2021   AST 30 09/14/2021   ALKPHOS 38 09/14/2021   BILITOT 0.3 09/14/2021   No results found  for: MARIEN BOLLS, VD25OH   Patient Active Problem List   Diagnosis Date Noted   Arthralgia of right temporomandibular joint 02/10/2024   Dyspnea on exertion 05/18/2023   Pulmonary nodules/lesions, multiple 04/30/2018   Mild hyperlipidemia 10/08/2017   Primary open angle glaucoma (POAG) of left eye, severe stage 12/18/2016   Primary open angle glaucoma (POAG) of right eye, mild stage 12/18/2016   Hearing loss 02/13/2015   Post-traumatic stress disorder 02/13/2015   Coronary artery disease 02/13/2015   Degeneration of intervertebral disc of lumbar region 02/13/2015   Essential (primary) hypertension 02/13/2015   Discoloration of nail 02/13/2015   GERD (gastroesophageal reflux disease) 03/14/2014   History of cardiac catheterization 03/11/2014    Allergies  Allergen Reactions   Timolol  Shortness Of Breath   Isosorbide  Other (See Comments)   Methocarbamol Other (See Comments)    Other reaction(s): Melena   Nortriptyline Hcl    Prazosin Hcl     joint pain   Quetiapine Fumarate    Tizanidine    Imdur  [Isosorbide  Dinitrate] Other (See Comments)    Hypotension    Past Surgical History:  Procedure Laterality Date   CARDIAC CATHETERIZATION N/A 09/12/2015   Procedure: Left Heart Cath and Coronary Angiography;  Surgeon: Wolm JINNY Rhyme, MD;  Location: [facility] INVASIVE CV LAB;  Service: Cardiovascular;  Laterality: N/A;   CATARACT EXTRACTION W/ INTRAOCULAR LENS IMPLANT Left 05/09/2017   CERVICAL SPINE SURGERY     COLONOSCOPY  2008  CORONARY ANGIOPLASTY WITH STENT PLACEMENT     CORONARY ANGIOPLASTY WITH STENT PLACEMENT     NASAL SINUS SURGERY     SALIVARY GLAND SURGERY Right 2011   oncocytoma   TONSILLECTOMY      Social History   Tobacco Use   Smoking status: Former    Current packs/day: 0.00    Average packs/day: 1 pack/day for 11.0 years (11.0 ttl pk-yrs)    Types: Cigarettes    Start date: 83    Quit date: 32    Years since quitting: 53.8   Smokeless  tobacco: Never  Vaping Use   Vaping status: Never Used  Substance Use Topics   Alcohol use: No    Alcohol/week: 0.0 standard drinks of alcohol   Drug use: No     Medication list has been reviewed and updated.  Current Meds  Medication Sig   aspirin  81 MG EC tablet Take 1 tablet by mouth daily.   Cholecalciferol  50 MCG (2000 UT) CAPS Take 1 capsule by mouth daily.    clobetasol cream (TEMOVATE) 0.05 % Apply topically. prn   clopidogrel  (PLAVIX ) 75 MG tablet Take 1 tablet by mouth daily.   co-enzyme Q-10 30 MG capsule Take 100 mg by mouth daily.   cyclobenzaprine (FLEXERIL) 10 MG tablet Take 10 mg by mouth 3 (three) times daily as needed for muscle spasms.   dorzolamide  (TRUSOPT ) 2 % ophthalmic solution INSTILL 1 DROP IN EACH EYE THREE TIMES A DAY   hydrochlorothiazide (HYDRODIURIL) 25 MG tablet Take 1 tablet by mouth daily.   HYDROcodone -acetaminophen  (NORCO/VICODIN) 5-325 MG tablet Take 1 tablet by mouth every 6 (six) hours as needed for moderate pain.   ketoconazole (NIZORAL) 2 % shampoo KETOCONAZOLE, 2% (External Shampoo) - Historical Medication  (2 %) Active   latanoprost  (XALATAN ) 0.005 % ophthalmic solution Place 1 drop into both eyes at bedtime.    LORazepam  (ATIVAN ) 1 MG tablet Take 1 mg by mouth 2 (two) times daily.   metoprolol succinate (TOPROL-XL) 25 MG 24 hr tablet Take 25 mg by mouth daily.   Multiple Vitamins-Minerals (CENTRUM SILVER) tablet Take 1 tablet by mouth daily.    olmesartan (BENICAR) 40 MG tablet Take 1 tablet by mouth daily.   rosuvastatin  (CRESTOR ) 40 MG tablet TAKE ONE-HALF TABLET BY MOUTH AT BEDTIME FOR CHOLESTEROL   vitamin C  (ASCORBIC ACID ) 500 MG tablet Take 1,000 mg by mouth daily.       02/10/2024    8:49 AM 05/14/2023    1:58 PM 07/18/2022   11:12 AM 10/23/2020    8:45 AM  GAD 7 : Generalized Anxiety Score  Nervous, Anxious, on Edge 0 0 2 3  Control/stop worrying 0 0 0 0  Worry too much - different things 0 0 0 0  Trouble relaxing 0 0 0 0   Restless 0 0 0 0  Easily annoyed or irritable 0 0 0 0  Afraid - awful might happen 0 0 0 0  Total GAD 7 Score 0 0 2 3  Anxiety Difficulty Not difficult at all Not difficult at all Not difficult at all Not difficult at all       02/10/2024    8:49 AM 05/22/2023    8:48 AM 05/14/2023    1:58 PM  Depression screen PHQ 2/9  Decreased Interest 0 0 0  Down, Depressed, Hopeless 0 0 0  PHQ - 2 Score 0 0 0  Altered sleeping 0    Tired, decreased energy 0  Change in appetite 0    Feeling bad or failure about yourself  0    Trouble concentrating 0    Moving slowly or fidgety/restless 0    Suicidal thoughts 0    PHQ-9 Score 0    Difficult doing work/chores Not difficult at all      BP Readings from Last 3 Encounters:  02/10/24 122/68  05/14/23 112/68  07/18/22 120/76    Physical Exam Vitals and nursing note reviewed.  Constitutional:      General: He is not in acute distress.    Appearance: Normal appearance. He is well-developed.  HENT:     Head: Normocephalic and atraumatic.     Jaw: Tenderness (on right) and pain on movement (on right) present.     Right Ear: Tympanic membrane and ear canal normal. Decreased hearing noted.     Left Ear: Tympanic membrane and ear canal normal. Decreased hearing noted.     Nose:     Right Sinus: No maxillary sinus tenderness.     Left Sinus: No maxillary sinus tenderness.  Cardiovascular:     Rate and Rhythm: Normal rate and regular rhythm.  Pulmonary:     Effort: Pulmonary effort is normal. No respiratory distress.     Breath sounds: Normal breath sounds. No decreased breath sounds or wheezing.  Skin:    General: Skin is warm and dry.     Findings: No rash.  Neurological:     Mental Status: He is alert and oriented to person, place, and time.  Psychiatric:        Mood and Affect: Mood normal.        Behavior: Behavior normal.     Wt Readings from Last 3 Encounters:  02/10/24 170 lb (77.1 kg)  05/22/23 167 lb (75.8 kg)   05/14/23 167 lb (75.8 kg)    BP 122/68   Pulse (!) 59   Ht 5' 8 (1.727 m)   Wt 170 lb (77.1 kg)   SpO2 95%   BMI 25.85 kg/m   Assessment and Plan:  Problem List Items Addressed This Visit       Unprioritized   Post-traumatic stress disorder   Followed by [facility] for PTSD      Coronary artery disease (Chronic)   He sees Cardiology at [facility] - for labs and follow up      Relevant Medications   metoprolol succinate (TOPROL-XL) 25 MG 24 hr tablet   Essential (primary) hypertension (Chronic)   Well controlled blood pressure today. Current regimen is metoprolol, olmesartan and hctz. No medication side effects noted.        Relevant Medications   metoprolol succinate (TOPROL-XL) 25 MG 24 hr tablet   Mild hyperlipidemia (Chronic)   LDL is No results found for: LDLCALC  Current medication regimen is Crestor  20 mg. Goal LDL is < 55.       Relevant Medications   metoprolol succinate (TOPROL-XL) 25 MG 24 hr tablet   Arthralgia of right temporomandibular joint - Primary   May be due to abnormal pressure while sleeping on that side. Rec:  heat, avoid hard or chewy foods, tylenol  if needed       Return in about 4 months (around 06/12/2024) for TOC  Dr. Sol.    Leita HILARIO Adie, MD [facility] [place]

## 2024-02-10 NOTE — Assessment & Plan Note (Signed)
 Followed by [facility] for PTSD

## 2024-02-10 NOTE — Assessment & Plan Note (Signed)
 LDL is No results found for: LDLCALC  Current medication regimen is Crestor  20 mg. Goal LDL is < 55.

## 2024-02-10 NOTE — Assessment & Plan Note (Addendum)
 Well controlled blood pressure today. Current regimen is metoprolol, olmesartan and hctz. No medication side effects noted.

## 2024-02-10 NOTE — Assessment & Plan Note (Signed)
 He sees Cardiology at [facility] - for labs and follow up

## 2024-02-10 NOTE — Assessment & Plan Note (Signed)
 May be due to abnormal pressure while sleeping on that side. Rec:  heat, avoid hard or chewy foods, tylenol  if needed

## 2024-04-07 DIAGNOSIS — I2510 Atherosclerotic heart disease of native coronary artery without angina pectoris: Secondary | ICD-10-CM | POA: Diagnosis not present

## 2024-04-07 DIAGNOSIS — I493 Ventricular premature depolarization: Secondary | ICD-10-CM | POA: Diagnosis not present

## 2024-05-10 ENCOUNTER — Other Ambulatory Visit: Payer: Self-pay

## 2024-05-10 MED ORDER — LATANOPROST 0.005 % OP SOLN: 1.0000 [drp] | Freq: Every day | OPHTHALMIC | 0 refills | Status: DC

## 2024-05-10 MED ORDER — OLMESARTAN MEDOXOMIL 40 MG PO TABS: 40.0000 mg | ORAL_TABLET | Freq: Every day | ORAL | 0 refills | Status: AC

## 2024-05-10 MED ORDER — METOPROLOL SUCCINATE ER 25 MG PO TB24: 25.0000 mg | ORAL_TABLET | Freq: Every day | ORAL | 16 refills | Status: AC

## 2024-05-10 MED ORDER — HYDROCHLOROTHIAZIDE 25 MG PO TABS: 25.0000 mg | ORAL_TABLET | Freq: Every day | ORAL | 0 refills | Status: AC

## 2024-06-01 ENCOUNTER — Other Ambulatory Visit: Payer: Self-pay | Admitting: Family Medicine

## 2024-06-02 ENCOUNTER — Other Ambulatory Visit: Payer: Self-pay

## 2024-06-02 NOTE — Telephone Encounter (Signed)
 Pt wants 90 day supply. LB

## 2024-06-02 NOTE — Telephone Encounter (Signed)
 Requested medication (s) are due for refill today: yes  Requested medication (s) are on the active medication list: yes  Last refill:  05/10/24  Future visit scheduled: yes  Notes to clinic:  Unable to refill per protocol, courtesy refill already given, routing for provider approval.      Requested Prescriptions  Pending Prescriptions Disp Refills   latanoprost  (XALATAN ) 0.005 % ophthalmic solution [Pharmacy Med Name: LATANOPROST  0.005% EYE DROPS] 7.5 mL 1    Sig: INSTILL 1 DROP INTO BOTH EYES AT BEDTIME     Ophthalmology:  Glaucoma Failed - 06/02/2024  3:28 PM      Failed - Valid encounter within last 12 months    Recent Outpatient Visits           3 months ago Arthralgia of right temporomandibular joint   [facility], Leita DEL, MD

## 2024-06-02 NOTE — Telephone Encounter (Signed)
 Dr. Kotturi has approved pt 90 day supply. LB

## 2024-06-03 ENCOUNTER — Ambulatory Visit: Payer: Self-pay

## 2024-06-03 VITALS — Ht 70.0 in | Wt 162.8 lb

## 2024-06-03 DIAGNOSIS — Z0000 Encounter for general adult medical examination without abnormal findings: Secondary | ICD-10-CM

## 2024-06-03 NOTE — Progress Notes (Signed)
 "  Chief Complaint  Patient presents with   Medicare Wellness     Subjective:   Dominic Mcmillan is a 79 y.o. male who presents for a Medicare Annual Wellness Visit.  Visit info / Clinical Intake: Medicare Wellness Visit Type:: Subsequent Annual Wellness Visit Persons participating in visit and providing information:: patient Medicare Wellness Visit Mode:: Video Since this visit was completed virtually, some vitals may be partially provided or unavailable. Missing vitals are due to the limitations of the virtual format.: Documented vitals are patient reported If Telephone or Video please confirm:: I connected with patient using audio/video enable telemedicine. I verified patient identity with two identifiers, discussed telehealth limitations, and patient agreed to proceed. Patient Location:: Home Provider Location:: Office Interpreter Needed?: No Pre-visit prep was completed: yes AWV questionnaire completed by patient prior to visit?: yes Date:: 05/30/24 Living arrangements:: lives with spouse/significant other Patient's Overall Health Status Rating: good Typical amount of pain: (!) a lot Does pain affect daily life?: (!) yes (Followed by medical attention) Are you currently prescribed opioids?: (!) yes  Dietary Habits and Nutritional Risks How many meals a day?: 3 Eats fruit and vegetables daily?: yes Most meals are obtained by: preparing own meals In the last 2 weeks, have you had any of the following?: none Diabetic:: no  Functional Status Activities of Daily Living (to include ambulation/medication): Independent Ambulation: Independent with device- listed below Home Assistive Devices/Equipment: Eyeglasses; Other (Comment) (Hearing Aids) Medication Administration: Independent Home Management (perform basic housework or laundry): Independent Manage your own finances?: yes Primary transportation is: driving Concerns about vision?: no *vision screening is required for  WTM* Concerns about hearing?: (!) yes Uses hearing aids?: (!) yes Hear whispered voice?: (!) no *in-person visit only*  Fall Screening Falls in the past year?: 0 Number of falls in past year: 0 Was there an injury with Fall?: 0 Fall Risk Category Calculator: 0 Patient Fall Risk Level: Low Fall Risk  Fall Risk Patient at Risk for Falls Due to: No Fall Risks Fall risk Follow up: Falls evaluation completed  Home and Transportation Safety: All rugs have non-skid backing?: N/A, no rugs All stairs or steps have railings?: yes Grab bars in the bathtub or shower?: (!) no Have non-skid surface in bathtub or shower?: yes Good home lighting?: yes Regular seat belt use?: yes Hospital stays in the last year:: no  Cognitive Assessment Difficulty concentrating, remembering, or making decisions? : no Will 6CIT or Mini Cog be Completed: yes What year is it?: 0 points What month is it?: 0 points Give patient an address phrase to remember (5 components): [address] Dominic Mcmillan. About what time is it?: 0 points Count backwards from 20 to 1: 0 points Say the months of the year in reverse: 0 points Repeat the address phrase from earlier: 0 points 6 CIT Score: 0 points  Advance Directives (For Healthcare) Does Patient Have a Medical Advance Directive?: Yes Does patient want to make changes to medical advance directive?: No - Patient declined Type of Advance Directive: Healthcare Power of [place]; Living will Copy of Healthcare Power of Attorney in Chart?: No - copy requested Copy of Living Will in Chart?: No - copy requested  Reviewed/Updated  Reviewed/Updated: Reviewed All (Medical, Surgical, Family, Medications, Allergies, Care Teams, Patient Goals)    Allergies (verified) Timolol , Isosorbide , Methocarbamol, Nortriptyline hcl, Prazosin hcl, Quetiapine fumarate, Tizanidine, and Imdur  [isosorbide  dinitrate]   Current Medications (verified) Outpatient Encounter Medications as of  06/03/2024  Medication Sig   aspirin   81 MG EC tablet Take 1 tablet by mouth daily. (Patient not taking: Reported on 06/03/2024)   Cholecalciferol  50 MCG (2000 UT) CAPS Take 1 capsule by mouth daily.    clobetasol cream (TEMOVATE) 0.05 % Apply topically. prn   clopidogrel  (PLAVIX ) 75 MG tablet Take 1 tablet by mouth daily.   co-enzyme Q-10 30 MG capsule Take 100 mg by mouth daily.   cyclobenzaprine (FLEXERIL) 10 MG tablet Take 10 mg by mouth 3 (three) times daily as needed for muscle spasms. (Patient not taking: Reported on 06/03/2024)   dorzolamide  (TRUSOPT ) 2 % ophthalmic solution INSTILL 1 DROP IN EACH EYE THREE TIMES A DAY   hydrochlorothiazide  (HYDRODIURIL ) 25 MG tablet Take 1 tablet (25 mg total) by mouth daily. (Patient not taking: Reported on 06/03/2024)   HYDROcodone -acetaminophen  (NORCO/VICODIN) 5-325 MG tablet Take 1 tablet by mouth every 6 (six) hours as needed for moderate pain.   ketoconazole (NIZORAL) 2 % shampoo KETOCONAZOLE, 2% (External Shampoo) - Historical Medication  (2 %) Active   latanoprost  (XALATAN ) 0.005 % ophthalmic solution INSTILL 1 DROP INTO BOTH EYES AT BEDTIME   LORazepam  (ATIVAN ) 1 MG tablet Take 1 mg by mouth 2 (two) times daily.   metoprolol  succinate (TOPROL -XL) 25 MG 24 hr tablet Take 1 tablet (25 mg total) by mouth daily.   Multiple Vitamins-Minerals (CENTRUM SILVER) tablet Take 1 tablet by mouth daily.    olmesartan  (BENICAR ) 40 MG tablet Take 1 tablet (40 mg total) by mouth daily.   rosuvastatin  (CRESTOR ) 40 MG tablet TAKE ONE-HALF TABLET BY MOUTH AT BEDTIME FOR CHOLESTEROL   vitamin C  (ASCORBIC ACID ) 500 MG tablet Take 1,000 mg by mouth daily.   No facility-administered encounter medications on file as of 06/03/2024.    History: Past Medical History:  Diagnosis Date   Acute MI (HCC)    CAD (coronary artery disease)    Glaucoma    HLD (hyperlipidemia)    HTN (hypertension)    PTSD (post-traumatic stress disorder)    Sepsis (HCC) 06/04/2016   Past  Surgical History:  Procedure Laterality Date   CARDIAC CATHETERIZATION N/A 09/12/2015   Procedure: Left Heart Cath and Coronary Angiography;  Surgeon: Wolm JINNY Rhyme, MD;  Location: [facility] INVASIVE CV LAB;  Service: Cardiovascular;  Laterality: N/A;   CATARACT EXTRACTION W/ INTRAOCULAR LENS IMPLANT Left 05/09/2017   CERVICAL SPINE SURGERY     COLONOSCOPY  2008   CORONARY ANGIOPLASTY WITH STENT PLACEMENT     CORONARY ANGIOPLASTY WITH STENT PLACEMENT     NASAL SINUS SURGERY     SALIVARY GLAND SURGERY Right 2011   oncocytoma   TONSILLECTOMY     Family History  Problem Relation Age of Onset   Hypertension Mother    Stroke Mother    Congestive Heart Failure Father    Social History   Occupational History   Occupation: retired  Tobacco Use   Smoking status: Former    Current packs/day: 0.00    Average packs/day: 1 pack/day for 11.0 years (11.0 ttl pk-yrs)    Types: Cigarettes    Start date: 53    Quit date: 70    Years since quitting: 54.1   Smokeless tobacco: Never  Vaping Use   Vaping status: Never Used  Substance and Sexual Activity   Alcohol use: No    Alcohol/week: 0.0 standard drinks of alcohol   Drug use: No   Sexual activity: Not on file   Tobacco Counseling Counseling given: No  SDOH Screenings   Food Insecurity: No  Food Insecurity (06/03/2024)  Housing: Low Risk (06/03/2024)  Transportation Needs: No Transportation Needs (06/03/2024)  Utilities: Not At Risk (06/03/2024)  Alcohol Screen: Low Risk (05/22/2023)  Depression (PHQ2-9): Low Risk (06/03/2024)  Financial Resource Strain: Low Risk (05/30/2024)  Physical Activity: Inactive (06/03/2024)  Social Connections: Moderately Isolated (06/03/2024)  Stress: No Stress Concern Present (06/03/2024)  Tobacco Use: Medium Risk (06/03/2024)  Health Literacy: Adequate Health Literacy (06/03/2024)   See flowsheets for full screening details  Depression Screen PHQ 2 & 9 Depression Scale- Over the past 2 weeks, how often have you been  bothered by any of the following problems? Little interest or pleasure in doing things: 0 Feeling down, depressed, or hopeless (PHQ Adolescent also includes...irritable): 0 PHQ-2 Total Score: 0 Trouble falling or staying asleep, or sleeping too much: 0 Feeling tired or having little energy: 0 Poor appetite or overeating (PHQ Adolescent also includes...weight loss): 0 Feeling bad about yourself - or that you are a failure or have let yourself or your family down: 0 Trouble concentrating on things, such as reading the newspaper or watching television (PHQ Adolescent also includes...like school work): 0 Moving or speaking so slowly that other people could have noticed. Or the opposite - being so fidgety or restless that you have been moving around a lot more than usual: 0 Thoughts that you would be better off dead, or of hurting yourself in some way: 0 PHQ-9 Total Score: 0 If you checked off any problems, how difficult have these problems made it for you to do your work, take care of things at home, or get along with other people?: Not difficult at all     Goals Addressed               This Visit's Progress     Increase physical activity (pt-stated)        Remain active!             Objective:    Today's Vitals   06/03/24 0904  Weight: 162 lb 12.8 oz (73.8 kg)  Height: 5' 10 (1.778 m)   Body mass index is 23.36 kg/m.  Hearing/Vision screen Hearing Screening - Comments:: Wears Hearing Aids Vision Screening - Comments:: Wears rx glasses - up to date with routine eye exams with  Dr Glover Immunizations and Health Maintenance Health Maintenance  Topic Date Due   COVID-19 Vaccine (8 - 2025-26 season) 12/29/2023   Medicare Annual Wellness (AWV)  06/03/2025   DTaP/Tdap/Td (3 - Td or Tdap) 12/19/2030   Pneumococcal Vaccine: 50+ Years  Completed   Influenza Vaccine  Completed   Zoster Vaccines- Shingrix  Completed   Meningococcal B Vaccine  Aged Out         Assessment/Plan:  This is a routine wellness examination for Dominic Mcmillan.  Patient Care Team: Justus Leita DEL, MD as PCP - General (Internal Medicine) [facility] (Internal Medicine) Candise Lenis, MD as Referring Physician (Ophthalmology)  I have personally reviewed and noted the following in the patients chart:   Medical and social history Use of alcohol, tobacco or illicit drugs  Current medications and supplements including opioid prescriptions. Functional ability and status Nutritional status Physical activity Advanced directives List of other physicians Hospitalizations, surgeries, and ER visits in previous 12 months Vitals Screenings to include cognitive, depression, and falls Referrals and appointments  No orders of the defined types were placed in this encounter.  In addition, I have reviewed and discussed with patient certain preventive protocols, quality metrics, and best practice  recommendations. A written personalized care plan for preventive services as well as general preventive health recommendations were provided to patient.   Dominic LELON Blush, LPN   10/31/7971   Return in 53 weeks (on 06/09/2025).  After Visit Summary: (MyChart) Due to this being a telephonic visit, the after visit summary with patients personalized plan was offered to patient via MyChart   Nurse Notes: No voiced or noted concerns at this time "

## 2024-06-03 NOTE — Patient Instructions (Addendum)
 [name],  Thank you for taking the time for your Medicare Wellness Visit. I appreciate your continued commitment to your health goals. Please review the care plan we discussed, and feel free to reach out if I can assist you further.  Please note that Annual Wellness Visits do not include a physical exam. Some assessments may be limited, especially if the visit was conducted virtually. If needed, we may recommend an in-person follow-up with your provider.  Ongoing Care Seeing your primary care provider every 3 to 6 months helps us  monitor your health and provide consistent, personalized care.   Referrals If a referral was made during today's visit and you haven't received any updates within two weeks, please contact the referred provider directly to check on the status.  Recommended Screenings:  Health Maintenance  Topic Date Due   COVID-19 Vaccine (8 - 2025-26 season) 12/29/2023   Medicare Annual Wellness Visit  06/03/2025   DTaP/Tdap/Td vaccine (3 - Td or Tdap) 12/19/2030   Pneumococcal Vaccine for age over 81  Completed   Flu Shot  Completed   Zoster (Shingles) Vaccine  Completed   Meningitis B Vaccine  Aged Out       06/03/2024    9:08 AM  Advanced Directives  Does Patient Have a Medical Advance Directive? Yes  Type of Estate Agent of [place];Living will  Does patient want to make changes to medical advance directive? No - Patient declined  Copy of Healthcare Power of Attorney in Chart? No - copy requested    Vision: Annual vision screenings are recommended for early detection of glaucoma, cataracts, and diabetic retinopathy. These exams can also reveal signs of chronic conditions such as diabetes and high blood pressure.  Dental: Annual dental screenings help detect early signs of oral cancer, gum disease, and other conditions linked to overall health, including heart disease and diabetes.  Please see the attached documents for additional  preventive care recommendations.

## 2024-06-10 ENCOUNTER — Ambulatory Visit: Admitting: Family Medicine
